# Patient Record
Sex: Female | Born: 1970 | Race: Black or African American | Hispanic: No | Marital: Married | Smoking: Never smoker
Health system: Southern US, Community
[De-identification: ages and names within clinical notes are randomized; demographics above are authoritative.]

## PROBLEM LIST (undated history)

## (undated) ENCOUNTER — Emergency Department (HOSPITAL_BASED_OUTPATIENT_CLINIC_OR_DEPARTMENT_OTHER): Admission: EM | Payer: 59 | Source: Home / Self Care

## (undated) DIAGNOSIS — G35 Multiple sclerosis: Secondary | ICD-10-CM

## (undated) DIAGNOSIS — M2550 Pain in unspecified joint: Secondary | ICD-10-CM

## (undated) DIAGNOSIS — Z91018 Allergy to other foods: Secondary | ICD-10-CM

## (undated) DIAGNOSIS — M1990 Unspecified osteoarthritis, unspecified site: Secondary | ICD-10-CM

## (undated) DIAGNOSIS — G35D Multiple sclerosis, unspecified: Secondary | ICD-10-CM

## (undated) DIAGNOSIS — D571 Sickle-cell disease without crisis: Secondary | ICD-10-CM

## (undated) DIAGNOSIS — I10 Essential (primary) hypertension: Secondary | ICD-10-CM

## (undated) HISTORY — DX: Unspecified osteoarthritis, unspecified site: M19.90

## (undated) HISTORY — DX: Allergy to other foods: Z91.018

## (undated) HISTORY — DX: Sickle-cell disease without crisis: D57.1

## (undated) HISTORY — DX: Multiple sclerosis, unspecified: G35.D

## (undated) HISTORY — PX: BRAIN AVM REPAIR: SHX202

## (undated) HISTORY — DX: Pain in unspecified joint: M25.50

## (undated) HISTORY — DX: Essential (primary) hypertension: I10

## (undated) HISTORY — DX: Multiple sclerosis: G35

---

## 1996-11-07 HISTORY — PX: BRAIN SURGERY: SHX531

## 1998-08-26 ENCOUNTER — Inpatient Hospital Stay (HOSPITAL_COMMUNITY): Admission: AD | Admit: 1998-08-26 | Discharge: 1998-09-02 | Payer: Self-pay | Admitting: Family Medicine

## 1998-08-26 ENCOUNTER — Encounter: Payer: Self-pay | Admitting: Neurosurgery

## 1998-08-31 ENCOUNTER — Encounter: Payer: Self-pay | Admitting: Neurosurgery

## 1998-09-25 ENCOUNTER — Ambulatory Visit (HOSPITAL_COMMUNITY): Admission: RE | Admit: 1998-09-25 | Discharge: 1998-09-25 | Payer: Self-pay | Admitting: Psychiatry

## 1998-10-08 ENCOUNTER — Ambulatory Visit (HOSPITAL_COMMUNITY): Admission: RE | Admit: 1998-10-08 | Discharge: 1998-10-08 | Payer: Self-pay | Admitting: Psychiatry

## 1998-12-22 ENCOUNTER — Ambulatory Visit (HOSPITAL_COMMUNITY): Admission: RE | Admit: 1998-12-22 | Discharge: 1998-12-22 | Payer: Self-pay | Admitting: Psychiatry

## 1998-12-30 ENCOUNTER — Ambulatory Visit (HOSPITAL_COMMUNITY): Admission: RE | Admit: 1998-12-30 | Discharge: 1998-12-30 | Payer: Self-pay | Admitting: Psychiatry

## 1999-05-05 ENCOUNTER — Other Ambulatory Visit: Admission: RE | Admit: 1999-05-05 | Discharge: 1999-05-05 | Payer: Self-pay | Admitting: *Deleted

## 1999-05-21 ENCOUNTER — Ambulatory Visit (HOSPITAL_COMMUNITY): Admission: RE | Admit: 1999-05-21 | Discharge: 1999-05-21 | Payer: Self-pay | Admitting: *Deleted

## 2005-03-24 ENCOUNTER — Other Ambulatory Visit: Admission: RE | Admit: 2005-03-24 | Discharge: 2005-03-24 | Payer: Self-pay | Admitting: Family Medicine

## 2006-02-01 ENCOUNTER — Encounter: Admission: RE | Admit: 2006-02-01 | Discharge: 2006-02-01 | Payer: Self-pay | Admitting: Family Medicine

## 2006-02-23 ENCOUNTER — Encounter: Admission: RE | Admit: 2006-02-23 | Discharge: 2006-02-23 | Payer: Self-pay | Admitting: Family Medicine

## 2006-03-13 ENCOUNTER — Ambulatory Visit (HOSPITAL_COMMUNITY): Admission: RE | Admit: 2006-03-13 | Discharge: 2006-03-13 | Payer: Self-pay | Admitting: Family Medicine

## 2010-01-29 ENCOUNTER — Encounter: Admission: RE | Admit: 2010-01-29 | Discharge: 2010-01-29 | Payer: Self-pay | Admitting: Psychiatry

## 2011-12-17 ENCOUNTER — Emergency Department (HOSPITAL_COMMUNITY)
Admission: EM | Admit: 2011-12-17 | Discharge: 2011-12-17 | Disposition: A | Discharge status: Home / Self Care | Payer: Medicare Other | Attending: Emergency Medicine | Admitting: Emergency Medicine

## 2011-12-17 ENCOUNTER — Encounter (HOSPITAL_COMMUNITY): Payer: Self-pay | Admitting: *Deleted

## 2011-12-17 ENCOUNTER — Emergency Department (HOSPITAL_COMMUNITY): Payer: Medicare Other

## 2011-12-17 DIAGNOSIS — R220 Localized swelling, mass and lump, head: Secondary | ICD-10-CM | POA: Insufficient documentation

## 2011-12-17 DIAGNOSIS — G35 Multiple sclerosis: Secondary | ICD-10-CM | POA: Insufficient documentation

## 2011-12-17 DIAGNOSIS — K1120 Sialoadenitis, unspecified: Secondary | ICD-10-CM | POA: Insufficient documentation

## 2011-12-17 HISTORY — DX: Multiple sclerosis: G35

## 2011-12-17 MED ORDER — IOHEXOL 300 MG/ML  SOLN
100.0000 mL | Freq: Once | INTRAMUSCULAR | Status: AC | PRN
  Administered 2011-12-17: 100 mL via INTRAVENOUS

## 2011-12-17 NOTE — ED Notes (Signed)
Patient transported to CT 

## 2011-12-17 NOTE — ED Notes (Signed)
Pt in c/o sudden onset of swelling to left lower jaw, denies mouth, tongue or throat swelling. Denies pain when moving jaw. Denies pain.

## 2011-12-17 NOTE — ED Provider Notes (Signed)
History     CSN: [id number]  Arrival date & time 12/17/11  1617   First MD Initiated Contact with Patient 12/17/11 1625     4:59 PM HPI Meredith Wright is a 41 y.o. female presenting with left facial swelling. Reports she was eating [employer] and suddenly felt the left side of her face began to swell. Denies difficulty breathing, tongue swelling, pruritus, shortness of breath, cough, wheezing, rash. Denies ear pain. Denies facial pain. Denies any known allergies. Reports she has eaten that sandwich in the past without a reaction. Denies dental pain, sore throat, numbness, tingling or weakness. History of MS. Does not take any medications.  The history is provided by the patient.    Past Medical History  Diagnosis Date  . MS (multiple sclerosis)     History reviewed. No pertinent past surgical history.  History reviewed. No pertinent family history.  History  Substance Use Topics  . Smoking status: Not on file  . Smokeless tobacco: Not on file  . Alcohol Use:     OB History    Grav Para Term Preterm Abortions TAB SAB Ect Mult Living                  Review of Systems  Constitutional: Negative for chills and unexpected weight change.  HENT: Positive for facial swelling. Negative for congestion, sore throat, rhinorrhea, sneezing, mouth sores, neck pain, neck stiffness, dental problem, postnasal drip and sinus pressure.   Respiratory: Negative for shortness of breath and wheezing.   Gastrointestinal: Negative for nausea and vomiting.  Neurological: Negative for dizziness, numbness and headaches.  All other systems reviewed and are negative.    Allergies  Review of patient's allergies indicates no known allergies.  Home Medications  No current outpatient prescriptions on file.  BP 130/72  Pulse 68  Temp(Src) 98.7 F (37.1 C) (Oral)  Resp 16  Ht 5\' 7"  (1.702 m)  Wt 260 lb (117.935 kg)  BMI 40.72 kg/m2  SpO2 100%  Physical Exam  Vitals reviewed. Constitutional:  She is oriented to person, place, and time. Vital signs are normal. She appears well-developed and well-nourished. No distress.  HENT:  Head: Atraumatic. Head is without laceration.    Right Ear: Hearing, tympanic membrane, external ear and ear canal normal.  Left Ear: Hearing, tympanic membrane, external ear and ear canal normal.  Nose: Nose normal.  Mouth/Throat: Uvula is midline, oropharynx is clear and moist and mucous membranes are normal.  Eyes: Pupils are equal, round, and reactive to light.  Neck: Neck supple.  Pulmonary/Chest: Effort normal.  Neurological: She is alert and oriented to person, place, and time.  Skin: Skin is warm and dry. No rash noted. No erythema. No pallor.  Psychiatric: She has a normal mood and affect. Her behavior is normal.    ED Course  Procedures No results found for this or any previous visit. Ct Maxillofacial W/cm  12/17/2011  *RADIOLOGY REPORT*  Clinical Data: Left facial swelling.  Question  parotitis  CT MAXILLOFACIAL WITH CONTRAST  Technique:  Multidetector CT imaging of the maxillofacial structures was performed with intravenous contrast. Multiplanar CT image reconstructions were also generated.  Contrast: OMNIPAQUE IOHEXOL 300 MG/ML IV SOLN  Comparison: None.  Findings: Mild edema is noted in the   fat below the left parotid gland.  This is deep to the platysma muscle and may represent mild parotitis or cellulitis.  The remainder of the parotid gland is fatty infiltrated and appears mildly enlarged.  No parotid stones. No focal fluid collection.  Submandibular gland is normal bilaterally.  Larynx is normal.  Para pharyngeal soft tissues are normal.  Left level II lymph node measures 11 x 13 mm.  Right level II lymph node measures 13 x 7 mm.  These are likely reactive nodes.  Paranasal sinuses are clear.  Orbit is intact.  IMPRESSION: Mild enlargement of left parotid gland with mild edema  inferior to the  left parotid gland.  Findings suggest mild  parotitis.  Original Report Authenticated By: Camelia Phenes, M.D.     MDM  5:04 PM  Suspect a left parotitis. Will order a CT scan for further evaluation. Pt not currently having pain.   6:23 PM Diagnosis: mild parotitis Treatment: warm compresses, lemon drops and ENT referral.  Discussed this with patient and family who voice understanding and agree with plan. Questions have all been answered.     Thomasene Lot, PA-C 12/17/11 1824

## 2011-12-18 NOTE — ED Provider Notes (Signed)
Medical screening examination/treatment/procedure(s) were performed by non-physician practitioner and as supervising physician I was immediately available for consultation/collaboration.   Camella Seim M Duan Scharnhorst, DO 12/18/11 0326 

## 2013-06-03 ENCOUNTER — Other Ambulatory Visit: Payer: Medicare Other

## 2013-06-06 ENCOUNTER — Encounter: Payer: Self-pay | Admitting: Physician Assistant

## 2013-06-06 ENCOUNTER — Ambulatory Visit (INDEPENDENT_AMBULATORY_CARE_PROVIDER_SITE_OTHER): Payer: Medicare Other | Admitting: Physician Assistant

## 2013-06-06 ENCOUNTER — Other Ambulatory Visit: Payer: Self-pay | Admitting: Physician Assistant

## 2013-06-06 VITALS — BP 144/104 | HR 72 | Temp 97.4°F | Resp 18 | Ht 66.0 in | Wt 267.0 lb

## 2013-06-06 DIAGNOSIS — Z23 Encounter for immunization: Secondary | ICD-10-CM

## 2013-06-06 DIAGNOSIS — Z79899 Other long term (current) drug therapy: Secondary | ICD-10-CM

## 2013-06-06 DIAGNOSIS — D649 Anemia, unspecified: Secondary | ICD-10-CM

## 2013-06-06 DIAGNOSIS — G35 Multiple sclerosis: Secondary | ICD-10-CM

## 2013-06-06 DIAGNOSIS — Z0000 Encounter for general adult medical examination without abnormal findings: Secondary | ICD-10-CM

## 2013-06-06 DIAGNOSIS — D571 Sickle-cell disease without crisis: Secondary | ICD-10-CM | POA: Insufficient documentation

## 2013-06-06 DIAGNOSIS — G35D Multiple sclerosis, unspecified: Secondary | ICD-10-CM

## 2013-06-06 DIAGNOSIS — I10 Essential (primary) hypertension: Secondary | ICD-10-CM

## 2013-06-06 LAB — CBC WITH DIFFERENTIAL/PLATELET
Basophils Absolute: 0 10*3/uL (ref 0.0–0.1)
Basophils Relative: 1 % (ref 0–1)
Eosinophils Absolute: 0.1 10*3/uL (ref 0.0–0.7)
Eosinophils Relative: 2 % (ref 0–5)
HCT: 31.9 % — ABNORMAL LOW (ref 36.0–46.0)
Hemoglobin: 10.9 g/dL — ABNORMAL LOW (ref 12.0–15.0)
MCHC: 34.2 g/dL (ref 30.0–36.0)
Monocytes Relative: 8 % (ref 3–12)
Neutro Abs: 4 10*3/uL (ref 1.7–7.7)
Platelets: 320 10*3/uL (ref 150–400)
RBC: 4.25 MIL/uL (ref 3.87–5.11)

## 2013-06-06 LAB — COMPLETE METABOLIC PANEL WITH GFR
ALT: 11 U/L (ref 0–35)
AST: 14 U/L (ref 0–37)
Alkaline Phosphatase: 95 U/L (ref 39–117)
CO2: 24 mEq/L (ref 19–32)
Calcium: 9.1 mg/dL (ref 8.4–10.5)
Chloride: 103 mEq/L (ref 96–112)
Creat: 0.6 mg/dL (ref 0.50–1.10)
GFR, Est African American: >89 mL/min
GFR, Est Non African American: >89 mL/min
Glucose, Bld: 81 mg/dL (ref 70–99)
Total Bilirubin: 0.6 mg/dL (ref 0.3–1.2)

## 2013-06-06 LAB — LIPID PANEL
Total CHOL/HDL Ratio: 3.9 Ratio
VLDL: 24 mg/dL (ref 0–40)

## 2013-06-06 MED ORDER — AMLODIPINE BESYLATE 5 MG PO TABS: 5.0000 mg | ORAL_TABLET | Freq: Every day | ORAL | Status: DC

## 2013-06-06 NOTE — Addendum Note (Signed)
Addended by: WRAY, [place] on: 06/06/2013 11:21 AM   Modules accepted: Orders

## 2013-06-06 NOTE — Progress Notes (Signed)
Patient ID: Meredith Wright MRN: [id number], DOB: 1970/12/05, 42 y.o. Date of Encounter: @DATE @  Chief Complaint:  Chief Complaint  Patient presents with  . routine check up    hasn't been here in awhile, needs labs for neurology    HPI: 42 y.o. year old AA female  presents for CPE. Her LOV here was in 2011. Says she has seen no other PCP since. She sees Neuro for her Multiple Sclerosis. She has lab order sheet to have labs drawn here that were ordered by Neuro. I noted that this Neurologist is located at [facility]. Asked pt why she goes down there for care. She says this Neurologist used to be located here then he moved to [facility]. She has been with him for 13 years so she followed him there for her Neuro care.  She has no complaints or concerns to evaluate today but wanst to have physical.  Is fasting.  Does not want to do plevic exam today b/c on menses. Says she will return for that. Has been > 1 year since last pap/pelvic/Gyn care.    Past Medical History  Diagnosis Date  . MS (multiple sclerosis)   . Multiple sclerosis   . Sickle cell anemia     trait    SHE IS ON 3 MEDS FROM NEURO-THEY WERE ENTERED TODAY Home Meds: See attached medication section for current medication list. Any medications entered into computer today will not appear on this note's list. The medications listed below were entered prior to today. No current outpatient prescriptions on file prior to visit.   No current facility-administered medications on file prior to visit.    Allergies: No Known Allergies  History   Social History  . Marital Status: Married    Spouse Name: N/A    Number of Children: N/A  . Years of Education: N/A   Occupational History  . Not on file.   Social History Main Topics  . Smoking status: Never Smoker   . Smokeless tobacco: Never Used  . Alcohol Use: No  . Drug Use: No  . Sexually Active: Not on file   Other Topics Concern  . Not on file   Social  History Narrative   On Disability Secondary To Multiple Sclerosis   Does not work sec to this      Married. 2 children.   Son: 15 y/o   Daughter: 60 y/o    Family History  Problem Relation Age of Onset  . Heart disease Mother     murmur  . Heart disease Daughter     murmur  . Heart disease Maternal Grandmother   . Diabetes Maternal Grandmother      Review of Systems:  See HPI for pertinent ROS. All other ROS negative.    Physical Exam: Blood pressure 144/104, pulse 72, temperature 97.4 F (36.3 C), temperature source Oral, resp. rate 18, height 5\' 6"  (1.676 m), weight 267 lb (121.11 kg), last menstrual period 05/16/2013., Body mass index is 43.12 kg/(m^2). BP by ME; 142/100 on Right, Loud and Clear. Above was checked on left by nurse per pt. General:Obese AAF.  Appears in no acute distress. Head: Normocephalic, atraumatic, eyes without discharge, sclera non-icteric, nares are without discharge. Bilateral auditory canals clear, TM's are without perforation, pearly grey and translucent with reflective cone of light bilaterally. Oral cavity moist, posterior pharynx without exudate, erythema, peritonsillar abscess, or post nasal drip.  Neck: Supple. No thyromegaly. No lymphadenopathy.No carotid bruits.  Lungs:  Clear bilaterally to auscultation without wheezes, rales, or rhonchi. Breathing is unlabored. Heart: RRR with S1 S2. No murmurs, rubs, or gallops. Abdomen: Soft, non-tender, non-distended with normoactive bowel sounds. No hepatomegaly. No rebound/guarding. No obvious abdominal masses. Musculoskeletal:  Strength and tone normal for age. Extremities/Skin: Warm and dry. No edema. No rashes or suspicious lesions. Neuro: Alert and oriented X 3. Moves all extremities spontaneously. Gait is normal. CNII-XII grossly in tact. Psych:  Responds to questions appropriately with a normal affect.     ASSESSMENT AND PLAN:  42 y.o. year old female with  1. Visit for preventive health  examination - CBC with Differential - COMPLETE METABOLIC PANEL WITH GFR - Lipid panel - Vitamin D 25 hydroxy - TSH  2. Multiple sclerosis Per Neuro.   3. Essential hypertension, benign BP elevated. Start Norvasc. Will recheck BP at f/u OV in 2-3 weeks.take Norvasc that morning prior to OV - amLODipine (NORVASC) 5 MG tablet; Take 1 tablet (5 mg total) by mouth daily.  Dispense: 90 tablet; Refill: 3  4. RTC in 2-3 weeks when off menses to do Breast and Pelvic Exam with Pap smear.  Will discuss/order Mammogram at that OV as well. Will recheck BP on med at that OV also.I told her to take Norvasc that morning prior to OV .   Immunizations: Tetanus is the only immun required at this age. She says has been >10 years. She is agreeable to have this today.Give now.   [address] [place], [place], [facility] 06/06/2013 9:32 AM

## 2013-06-07 LAB — IRON AND TIBC
%SAT: 13 % — ABNORMAL LOW (ref 20–55)
TIBC: 328 ug/dL (ref 250–470)

## 2013-06-07 NOTE — Addendum Note (Signed)
Addended by: Reginia Forts on: 06/07/2013 04:00 PM   Modules accepted: Orders

## 2013-06-10 ENCOUNTER — Other Ambulatory Visit: Payer: Medicare Other

## 2013-06-10 DIAGNOSIS — D649 Anemia, unspecified: Secondary | ICD-10-CM

## 2013-06-11 ENCOUNTER — Telehealth: Payer: Self-pay | Admitting: Family Medicine

## 2013-06-11 DIAGNOSIS — D649 Anemia, unspecified: Secondary | ICD-10-CM

## 2013-06-11 LAB — FECAL OCCULT BLOOD, IMMUNOCHEMICAL: Fecal Occult Blood: NEGATIVE

## 2013-06-11 MED ORDER — POLYSACCHARIDE IRON COMPLEX 150 MG PO CAPS: 150.0000 mg | ORAL_CAPSULE | Freq: Two times a day (BID) | ORAL | Status: DC

## 2013-06-11 NOTE — Telephone Encounter (Signed)
Spoke to patient about all labs.  Hemoccult cards negative.  Provider wants her to start Nu Iron BID.  Rx to pharmacy.  Counseled patient on constipation prevention.

## 2013-06-11 NOTE — Telephone Encounter (Signed)
Message copied by Donne Anon on Tue Jun 11, 2013 10:48 AM ------      Message from: Allayne Butcher      Created: Mon Jun 10, 2013  4:31 PM       Labs show anemia.       Do the Heme occults .      AFTER she does these, start iron RX:             Nu Iron 150mg  one po BID  #60 / 5      Eat increased vegetables, water etc to prevent constipation on iron ------

## 2013-06-27 ENCOUNTER — Ambulatory Visit: Payer: Medicare Other | Admitting: Physician Assistant

## 2014-04-01 DIAGNOSIS — N938 Other specified abnormal uterine and vaginal bleeding: Secondary | ICD-10-CM | POA: Insufficient documentation

## 2014-04-25 ENCOUNTER — Other Ambulatory Visit: Payer: Self-pay | Admitting: Nurse Practitioner

## 2014-04-25 DIAGNOSIS — Z1231 Encounter for screening mammogram for malignant neoplasm of breast: Secondary | ICD-10-CM

## 2014-05-02 ENCOUNTER — Ambulatory Visit
Admission: RE | Admit: 2014-05-02 | Discharge: 2014-05-02 | Disposition: A | Discharge status: Home / Self Care | Payer: Medicare Other | Source: Ambulatory Visit | Attending: Nurse Practitioner | Admitting: Nurse Practitioner

## 2014-05-02 DIAGNOSIS — Z1231 Encounter for screening mammogram for malignant neoplasm of breast: Secondary | ICD-10-CM

## 2014-05-06 ENCOUNTER — Other Ambulatory Visit: Payer: Self-pay | Admitting: Nurse Practitioner

## 2014-05-06 DIAGNOSIS — R928 Other abnormal and inconclusive findings on diagnostic imaging of breast: Secondary | ICD-10-CM

## 2014-05-15 ENCOUNTER — Ambulatory Visit
Admission: RE | Admit: 2014-05-15 | Discharge: 2014-05-15 | Disposition: A | Discharge status: Home / Self Care | Payer: Medicare Other | Source: Ambulatory Visit | Attending: Nurse Practitioner | Admitting: Nurse Practitioner

## 2014-05-15 DIAGNOSIS — R928 Other abnormal and inconclusive findings on diagnostic imaging of breast: Secondary | ICD-10-CM

## 2015-10-16 ENCOUNTER — Other Ambulatory Visit: Payer: Self-pay | Admitting: Nurse Practitioner

## 2015-10-16 DIAGNOSIS — Z1231 Encounter for screening mammogram for malignant neoplasm of breast: Secondary | ICD-10-CM

## 2015-11-12 ENCOUNTER — Ambulatory Visit: Payer: Medicare Other

## 2015-12-20 ENCOUNTER — Emergency Department (HOSPITAL_BASED_OUTPATIENT_CLINIC_OR_DEPARTMENT_OTHER)
Admission: EM | Admit: 2015-12-20 | Discharge: 2015-12-20 | Disposition: A | Discharge status: Home / Self Care | Payer: Medicare Other | Attending: Emergency Medicine | Admitting: Emergency Medicine

## 2015-12-20 ENCOUNTER — Encounter (HOSPITAL_BASED_OUTPATIENT_CLINIC_OR_DEPARTMENT_OTHER): Payer: Self-pay | Admitting: Emergency Medicine

## 2015-12-20 DIAGNOSIS — R5383 Other fatigue: Secondary | ICD-10-CM | POA: Diagnosis not present

## 2015-12-20 DIAGNOSIS — R319 Hematuria, unspecified: Secondary | ICD-10-CM

## 2015-12-20 DIAGNOSIS — R079 Chest pain, unspecified: Secondary | ICD-10-CM | POA: Insufficient documentation

## 2015-12-20 DIAGNOSIS — Z79899 Other long term (current) drug therapy: Secondary | ICD-10-CM | POA: Diagnosis not present

## 2015-12-20 DIAGNOSIS — Z8669 Personal history of other diseases of the nervous system and sense organs: Secondary | ICD-10-CM | POA: Diagnosis not present

## 2015-12-20 DIAGNOSIS — N390 Urinary tract infection, site not specified: Secondary | ICD-10-CM | POA: Diagnosis not present

## 2015-12-20 DIAGNOSIS — D571 Sickle-cell disease without crisis: Secondary | ICD-10-CM | POA: Insufficient documentation

## 2015-12-20 DIAGNOSIS — R42 Dizziness and giddiness: Secondary | ICD-10-CM | POA: Diagnosis present

## 2015-12-20 LAB — BASIC METABOLIC PANEL
Anion gap: 9 (ref 5–15)
BUN: 11 mg/dL (ref 6–20)
CHLORIDE: 104 mmol/L (ref 101–111)
CO2: 27 mmol/L (ref 22–32)
CREATININE: 0.86 mg/dL (ref 0.44–1.00)
Calcium: 8.4 mg/dL — ABNORMAL LOW (ref 8.9–10.3)
Glucose, Bld: 120 mg/dL — ABNORMAL HIGH (ref 65–99)
POTASSIUM: 3.5 mmol/L (ref 3.5–5.1)
SODIUM: 140 mmol/L (ref 135–145)

## 2015-12-20 LAB — URINALYSIS, ROUTINE W REFLEX MICROSCOPIC
Bilirubin Urine: NEGATIVE
Glucose, UA: NEGATIVE mg/dL
KETONES UR: NEGATIVE mg/dL
NITRITE: NEGATIVE
PROTEIN: NEGATIVE mg/dL
Specific Gravity, Urine: 1.012 (ref 1.005–1.030)
pH: 7 (ref 5.0–8.0)

## 2015-12-20 LAB — URINE MICROSCOPIC-ADD ON

## 2015-12-20 LAB — CBC
HEMATOCRIT: 33.4 % — AB (ref 36.0–46.0)
Hemoglobin: 11.4 g/dL — ABNORMAL LOW (ref 12.0–15.0)
MCH: 25.9 pg — ABNORMAL LOW (ref 26.0–34.0)
MCHC: 34.1 g/dL (ref 30.0–36.0)
MCV: 75.9 fL — ABNORMAL LOW (ref 78.0–100.0)
PLATELETS: 296 10*3/uL (ref 150–400)
RBC: 4.4 MIL/uL (ref 3.87–5.11)
RDW: 16.1 % — AB (ref 11.5–15.5)
WBC: 7.5 10*3/uL (ref 4.0–10.5)

## 2015-12-20 LAB — TROPONIN I

## 2015-12-20 MED ORDER — CEPHALEXIN 500 MG PO CAPS: 500.0000 mg | ORAL_CAPSULE | Freq: Three times a day (TID) | ORAL | Status: DC

## 2015-12-20 NOTE — ED Notes (Signed)
Patient is very hard to assess, family who is with the patient at all times reports "she always answers questions weird" ... Unable to assess last known normal - only person who seems to think the patient is answering questions with some confusion is a friend who saw her 1 week ago and she admits that the patient seems confused, with altered mental status and evasive with questions.

## 2015-12-20 NOTE — ED Notes (Signed)
Patient states that she has had Chest pain off and on for an unknown amount of time. Reports that she was dizzy last week and still having some intermittent dizziness. Family members states that " she has complained of chest pain for the last 3 days and dizziness, I took her BP at home and her Bp was 220/106" The patient seems very confused about what is taking place with her. Reports that she would not have come if it had not been for the families concern

## 2015-12-20 NOTE — ED Provider Notes (Signed)
CSN: [id number]     Arrival date & time 12/20/15  1734 History  By signing my name below, I, Meredith Wright, attest that this documentation has been prepared under the direction and in the presence of [name], MD. Electronically Signed: Soijett Wright, ED Scribe. 12/20/2015. 6:34 PM.   Chief Complaint  Patient presents with  . Chest Pain  . Dizziness      The history is provided by the patient. No language interpreter was used.    HPI Comments: Meredith Wright is a 45 y.o. female with a medical hx of MS and sickle cell anemia trait, who presents to the Emergency Department complaining of intermittent sternal CP onset 1 week. Pt denies having CP at this time and the last episode was last week. She describes the CP as a pressure sensation. Pt friend took her blood pressure was 220/106 at home prior to arrival to the ED today. She states that she is having associated symptoms of intermittent dizziness and fatigue. She notes that she began to have dizziness while working out, and she felt off balance with a room spinning sensation. Pt states that that incidence occurred once and hasn't returned since. She states that she has not tried any medications for the relief for her symptoms. She denies vomiting, vision change, and any other symptoms. Denies being diabetic. Pt was on norvasc in 2014 for HTN that she only took for 1 month and didn't have any other HTN medications since. Pt does have a PCP at this time at [facility].   Past Medical History  Diagnosis Date  . MS (multiple sclerosis) (HCC)   . Multiple sclerosis (HCC)   . Sickle cell anemia (HCC)     trait   Past Surgical History  Procedure Laterality Date  . Cesarean section    . Brain surgery  11/07/1996    Biopsy -Dx MS  . Brain avm repair     Family History  Problem Relation Age of Onset  . Heart disease Mother     murmur  . Heart disease Daughter     murmur  . Heart disease Maternal Grandmother   . Diabetes  Maternal Grandmother    Social History  Substance Use Topics  . Smoking status: Never Smoker   . Smokeless tobacco: Never Used  . Alcohol Use: No   OB History    No data available     Review of Systems  Constitutional: Positive for fatigue.  Cardiovascular: Positive for chest pain.  Skin: Negative for color change, rash and wound.  Neurological: Positive for dizziness.  All other systems reviewed and are negative.     Allergies  Review of patient's allergies indicates no known allergies.  Home Medications   Prior to Admission medications   Medication Sig Start Date End Date Taking? Authorizing Provider  amLODipine (NORVASC) 5 MG tablet Take 1 tablet (5 mg total) by mouth daily. 06/06/13   Mary B Dixon, PA-C  AUBAGIO 14 MG TABS Take 1 tablet by mouth daily. 05/28/13   Historical Provider, MD  gabapentin (NEURONTIN) 300 MG capsule Take 1 capsule by mouth 4 (four) times daily. 06/01/13   Historical Provider, MD  iron polysaccharides (NIFEREX) 150 MG capsule Take 1 capsule (150 mg total) by mouth 2 (two) times daily. 06/11/13   [name], PA-C  levETIRAcetam (KEPPRA) 500 MG tablet Take 1 tablet by mouth 2 (two) times daily. 06/01/13   Historical Provider, MD   BP 150/118 mmHg  Pulse  73  Temp(Src) 98 F (36.7 C) (Oral)  Resp 18  Ht  (1.702 m)  Wt 286 lb (129.729 kg)  BMI 44.78 kg/m2  SpO2 96%  LMP 12/20/2015 Physical Exam  Constitutional: She is oriented to person, place, and time. She appears well-developed and well-nourished. No distress.  HENT:  Head: Normocephalic and atraumatic.  Eyes: EOM are normal. Pupils are equal, round, and reactive to light.  Neck: Neck supple.  Cardiovascular: Normal rate, regular rhythm and normal heart sounds.  Exam reveals no gallop and no friction rub.   No murmur heard. Pulmonary/Chest: Effort normal and breath sounds normal. No respiratory distress. She has no wheezes. She has no rales.  Abdominal: Soft. There is no tenderness.   Musculoskeletal: Normal range of motion.  Neurological: She is alert and oriented to person, place, and time. No cranial nerve deficit. Coordination normal.  Nl finger to nose testing. Nl rapid alternating movement. Nl heel to shin.   Skin: Skin is warm and dry.  Psychiatric: She has a normal mood and affect. Her behavior is normal.  Nursing note and vitals reviewed.   ED Course  Procedures (including critical care time) DIAGNOSTIC STUDIES: Oxygen Saturation is 96% on RA, nl by my interpretation.    COORDINATION OF CARE: 6:34 PM Discussed treatment plan with pt at bedside which includes EKG, labs, UA, and pt agreed to plan.    Labs Review Labs Reviewed  BASIC METABOLIC PANEL - Abnormal; Notable for the following:    Glucose, Bld 120 (*)    Calcium 8.4 (*)    All other components within normal limits  CBC - Abnormal; Notable for the following:    Hemoglobin 11.4 (*)    HCT 33.4 (*)    MCV 75.9 (*)    MCH 25.9 (*)    RDW 16.1 (*)    All other components within normal limits  URINALYSIS, ROUTINE W REFLEX MICROSCOPIC (NOT AT [facility]) - Abnormal; Notable for the following:    APPearance CLOUDY (*)    Hgb urine dipstick LARGE (*)    Leukocytes, UA MODERATE (*)    All other components within normal limits  URINE MICROSCOPIC-ADD ON - Abnormal; Notable for the following:    Squamous Epithelial / LPF 0-5 (*)    Bacteria, UA MANY (*)    All other components within normal limits  URINE CULTURE  TROPONIN I    Imaging Review No results found. I have personally reviewed and evaluated these images and lab results as part of my medical decision-making.   EKG Interpretation   Date/Time:  Sunday December 20 2015 17:40:21 EST Ventricular Rate:  82 PR Interval:  160 QRS Duration: 78 QT Interval:  378 QTC Calculation: 441 R Axis:   69 Text Interpretation:  Normal sinus rhythm Normal ECG No previous tracing  Confirmed by [name] MD, [name] ([id number]) on 12/20/2015 5:57:01 PM      MDM    Final diagnoses:  Urinary tract infection with hematuria, site unspecified    45 y.o. female presents with intermittent dizziness over the last week and feeling unwell. Member took her blood pressure at home and found it to be elevated. It is elevated on a single reading here today but recurrent correct to normal level without intervention. I do not feel the patient needs antihypertensive therapy currently and I recommended follow-up with primary care for that problem. A dispute patient has developed a urinary tract infection which is the likely etiology of her ongoing dizziness symptoms of  feeling unwell. She had a brief episode of chest pain that occurred 1 week ago that resolved spontaneously, no EKG abnormalities, negative troponin from triage, we'll treat empirically with Keflex for UTI pending culture. Plan to follow up with PCP as needed and return precautions discussed for worsening or new concerning symptoms.   I personally performed the services described in this documentation, which was scribed in my presence. The recorded information has been reviewed and is accurate.      [name], MD 12/20/15 2322

## 2015-12-20 NOTE — Discharge Instructions (Signed)

## 2015-12-22 LAB — URINE CULTURE

## 2016-04-08 DIAGNOSIS — D573 Sickle-cell trait: Secondary | ICD-10-CM | POA: Insufficient documentation

## 2018-02-23 ENCOUNTER — Encounter (HOSPITAL_COMMUNITY): Payer: Self-pay

## 2018-02-23 ENCOUNTER — Encounter (HOSPITAL_COMMUNITY)
Admission: RE | Admit: 2018-02-23 | Discharge: 2018-02-23 | Disposition: A | Discharge status: Home / Self Care | Payer: Medicare Other | Source: Ambulatory Visit | Attending: Psychiatry | Admitting: Psychiatry

## 2018-02-23 DIAGNOSIS — G35 Multiple sclerosis: Secondary | ICD-10-CM | POA: Diagnosis not present

## 2018-02-23 MED ORDER — SODIUM CHLORIDE 0.9 % IV SOLN
INTRAVENOUS | Status: DC
  Administered 2018-02-23: 08:00:00 via INTRAVENOUS

## 2018-02-23 MED ORDER — DIPHENHYDRAMINE HCL 50 MG/ML IJ SOLN
25.0000 mg | INTRAMUSCULAR | Status: DC
Start: 2018-02-23 — End: 2018-02-24
  Administered 2018-02-23: 25 mg via INTRAVENOUS
  Filled 2018-02-23: qty 1

## 2018-02-23 MED ORDER — SODIUM CHLORIDE 0.9 % IV SOLN
300.0000 mg | INTRAVENOUS | Status: DC
  Administered 2018-02-23: 300 mg via INTRAVENOUS
  Filled 2018-02-23: qty 10

## 2018-02-23 MED ORDER — ACETAMINOPHEN 500 MG PO TABS
1000.0000 mg | ORAL_TABLET | ORAL | Status: DC
  Administered 2018-02-23: 1000 mg via ORAL
  Filled 2018-02-23: qty 2

## 2018-02-23 MED ORDER — METHYLPREDNISOLONE SODIUM SUCC 125 MG IJ SOLR
125.0000 mg | INTRAMUSCULAR | Status: DC
  Administered 2018-02-23: 125 mg via INTRAVENOUS
  Filled 2018-02-23: qty 2

## 2018-02-23 NOTE — Discharge Instructions (Signed)
Ocrelizumab injection What is this medicine? OCRELIZUMAB (ok re LIZ ue mab) treats multiple sclerosis. It helps to decrease the number of multiple sclerosis relapses. It is not a cure. This medicine may be used for other purposes; ask your health care provider or pharmacist if you have questions. COMMON BRAND NAME(S): OCREVUS What should I tell my health care provider before I take this medicine? They need to know if you have any of these conditions: -cancer -hepatitis B infection -other infection (especially a virus infection such as chickenpox, cold sores, or herpes) -an unusual or allergic reaction to ocrelizumab, other medicines, foods, dyes or preservatives -pregnant or trying to get pregnant -breast-feeding How should I use this medicine? This medicine is for infusion into a vein. It is given by a health care professional in a hospital or clinic setting. Talk to your pediatrician regarding the use of this medicine in children. Special care may be needed. Overdosage: If you think you have taken too much of this medicine contact a poison control center or emergency room at once. NOTE: This medicine is only for you. Do not share this medicine with others. What if I miss a dose? Keep appointments for follow-up doses as directed. It is important not to miss your dose. Call your doctor or health care professional if you are unable to keep an appointment. What may interact with this medicine? -alemtuzumab -daclizumab -dimethyl fumarate -fingolimod -glatiramer -interferon beta -live virus vaccines -mitoxantrone -natalizumab -peginterferon beta -rituximab -steroid medicines like prednisone or cortisone -teriflunomide This list may not describe all possible interactions. Give your health care provider a list of all the medicines, herbs, non-prescription drugs, or dietary supplements you use. Also tell them if you smoke, drink alcohol, or use illegal drugs. Some items may interact with  your medicine. What should I watch for while using this medicine? Tell your doctor or healthcare professional if your symptoms do not start to get better or if they get worse. This medicine can cause serious allergic reactions. To reduce your risk you may need to take medicine before treatment with this medicine. Take your medicine as directed. Women should inform their doctor if they wish to become pregnant or think they might be pregnant. There is a potential for serious side effects to an unborn child. Talk to your health care professional or pharmacist for more information. Female patients should use effective birth control methods while receiving this medicine and for 6 months after the last dose. Call your doctor or health care professional for advice if you get a fever, chills or sore throat, or other symptoms of a cold or flu. Do not treat yourself. This drug decreases your body's ability to fight infections. Try to avoid being around people who are sick. If you have a hepatitis B infection or a history of a hepatitis B infection, talk to your doctor. The symptoms of hepatitis B may get worse if you take this medicine. In some patients, this medicine may cause a serious brain infection that may cause death. If you have any problems seeing, thinking, speaking, walking, or standing, tell your doctor right away. If you cannot reach your doctor, urgently seek other source of medical care. This medicine can decrease the response to a vaccine. If you need to get vaccinated, tell your healthcare professional if you have received this medicine. Extra booster doses may be needed. Talk to your doctor to see if a different vaccination schedule is needed. Talk to your doctor about your risk of cancer.   You may be more at risk for certain types of cancers if you take this medicine. What side effects may I notice from receiving this medicine? Side effects that you should report to your doctor or health care  professional as soon as possible: -allergic reactions like skin rash, itching or hives, swelling of the face, lips, or tongue -breathing problems -facial flushing -fast, irregular heartbeat -lump or soreness in the breast -signs and symptoms of herpes such as cold sore, shingles, or genital sores -signs and symptoms of infection like fever or chills, cough, sore throat, pain or trouble passing urine -signs and symptoms of low blood pressure like dizziness; feeling faint or lightheaded, falls; unusually weak or tired -signs and symptoms of progressive multifocal leukoencephalopathy (PML) like changes in vision; clumsiness; confusion; personality changes; weakness on one side of the body -swelling of the ankles, feet, hands Side effects that usually do not require medical attention (report these to your doctor or health care professional if they continue or are bothersome): -back pain -depressed mood -diarrhea -pain, redness, or irritation at site where injected This list may not describe all possible side effects. Call your doctor for medical advice about side effects. You may report side effects to FDA at 1-800-FDA-1088. Where should I keep my medicine? This drug is given in a hospital or clinic and will not be stored at home. NOTE: This sheet is a summary. It may not cover all possible information. If you have questions about this medicine, talk to your doctor, pharmacist, or health care provider.  2018 Elsevier/Gold Standard (2016-02-09 09:40:25)  

## 2018-02-23 NOTE — Progress Notes (Signed)
Pt. Tolerated 1st infusion of Ocrevus without any problems, pt. To follow-up with doctor with any problems. Patient to return in 2 weeks.

## 2018-03-09 ENCOUNTER — Encounter (HOSPITAL_COMMUNITY)
Admission: RE | Admit: 2018-03-09 | Discharge: 2018-03-09 | Disposition: A | Discharge status: Home / Self Care | Payer: Medicare Other | Source: Ambulatory Visit | Attending: Psychiatry | Admitting: Psychiatry

## 2018-03-09 ENCOUNTER — Encounter (HOSPITAL_COMMUNITY): Payer: Self-pay

## 2018-03-09 DIAGNOSIS — G35 Multiple sclerosis: Secondary | ICD-10-CM | POA: Diagnosis not present

## 2018-03-09 MED ORDER — ACETAMINOPHEN 500 MG PO TABS
1000.0000 mg | ORAL_TABLET | ORAL | Status: AC
  Administered 2018-03-09: 1000 mg via ORAL
  Filled 2018-03-09: qty 2

## 2018-03-09 MED ORDER — DIPHENHYDRAMINE HCL 50 MG/ML IJ SOLN
25.0000 mg | INTRAMUSCULAR | Status: AC
  Administered 2018-03-09: 25 mg via INTRAVENOUS
  Filled 2018-03-09: qty 1

## 2018-03-09 MED ORDER — SODIUM CHLORIDE 0.9 % IV SOLN
INTRAVENOUS | Status: AC
  Administered 2018-03-09: 07:00:00 via INTRAVENOUS

## 2018-03-09 MED ORDER — SODIUM CHLORIDE 0.9 % IV SOLN
300.0000 mg | INTRAVENOUS | Status: AC
  Administered 2018-03-09: 300 mg via INTRAVENOUS
  Filled 2018-03-09: qty 10

## 2018-03-09 MED ORDER — METHYLPREDNISOLONE SODIUM SUCC 125 MG IJ SOLR
125.0000 mg | INTRAMUSCULAR | Status: AC
  Administered 2018-03-09: 125 mg via INTRAVENOUS
  Filled 2018-03-09: qty 2

## 2018-03-09 NOTE — Discharge Instructions (Signed)
°Ocrevus °Ocrelizumab injection °What is this medicine? °OCRELIZUMAB (ok re LIZ ue mab) treats multiple sclerosis. It helps to decrease the number of multiple sclerosis relapses. It is not a cure. °This medicine may be used for other purposes; ask your health care provider or pharmacist if you have questions. °COMMON BRAND NAME(S): OCREVUS °What should I tell my health care provider before I take this medicine? °They need to know if you have any of these conditions: °-cancer °-hepatitis B infection °-other infection (especially a virus infection such as chickenpox, cold sores, or herpes) °-an unusual or allergic reaction to ocrelizumab, other medicines, foods, dyes or preservatives °-pregnant or trying to get pregnant °-breast-feeding °How should I use this medicine? °This medicine is for infusion into a vein. It is given by a health care professional in a hospital or clinic setting. °Talk to your pediatrician regarding the use of this medicine in children. Special care may be needed. °Overdosage: If you think you have taken too much of this medicine contact a poison control center or emergency room at once. °NOTE: This medicine is only for you. Do not share this medicine with others. °What if I miss a dose? °Keep appointments for follow-up doses as directed. It is important not to miss your dose. Call your doctor or health care professional if you are unable to keep an appointment. °What may interact with this medicine? °-alemtuzumab °-daclizumab °-dimethyl fumarate °-fingolimod °-glatiramer °-interferon beta °-live virus vaccines °-mitoxantrone °-natalizumab °-peginterferon beta °-rituximab °-steroid medicines like prednisone or cortisone °-teriflunomide °This list may not describe all possible interactions. Give your health care provider a list of all the medicines, herbs, non-prescription drugs, or dietary supplements you use. Also tell them if you smoke, drink alcohol, or use illegal drugs. Some items may  interact with your medicine. °What should I watch for while using this medicine? °Tell your doctor or healthcare professional if your symptoms do not start to get better or if they get worse. °This medicine can cause serious allergic reactions. To reduce your risk you may need to take medicine before treatment with this medicine. Take your medicine as directed. °Women should inform their doctor if they wish to become pregnant or think they might be pregnant. There is a potential for serious side effects to an unborn child. Talk to your health care professional or pharmacist for more information. Female patients should use effective birth control methods while receiving this medicine and for 6 months after the last dose. °Call your doctor or health care professional for advice if you get a fever, chills or sore throat, or other symptoms of a cold or flu. Do not treat yourself. This drug decreases your body's ability to fight infections. Try to avoid being around people who are sick. °If you have a hepatitis B infection or a history of a hepatitis B infection, talk to your doctor. The symptoms of hepatitis B may get worse if you take this medicine. °In some patients, this medicine may cause a serious brain infection that may cause death. If you have any problems seeing, thinking, speaking, walking, or standing, tell your doctor right away. If you cannot reach your doctor, urgently seek other source of medical care. °This medicine can decrease the response to a vaccine. If you need to get vaccinated, tell your healthcare professional if you have received this medicine. Extra booster doses may be needed. Talk to your doctor to see if a different vaccination schedule is needed. °Talk to your doctor about your risk of   cancer. You may be more at risk for certain types of cancers if you take this medicine. °What side effects may I notice from receiving this medicine? °Side effects that you should report to your doctor or  health care professional as soon as possible: °-allergic reactions like skin rash, itching or hives, swelling of the face, lips, or tongue °-breathing problems °-facial flushing °-fast, irregular heartbeat °-lump or soreness in the breast °-signs and symptoms of herpes such as cold sore, shingles, or genital sores °-signs and symptoms of infection like fever or chills, cough, sore throat, pain or trouble passing urine °-signs and symptoms of low blood pressure like dizziness; feeling faint or lightheaded, falls; unusually weak or tired °-signs and symptoms of progressive multifocal leukoencephalopathy (PML) like changes in vision; clumsiness; confusion; personality changes; weakness on one side of the body °-swelling of the ankles, feet, hands °Side effects that usually do not require medical attention (report these to your doctor or health care professional if they continue or are bothersome): °-back pain °-depressed mood °-diarrhea °-pain, redness, or irritation at site where injected °This list may not describe all possible side effects. Call your doctor for medical advice about side effects. You may report side effects to FDA at 1-800-FDA-1088. °Where should I keep my medicine? °This drug is given in a hospital or clinic and will not be stored at home. °NOTE: This sheet is a summary. It may not cover all possible information. If you have questions about this medicine, talk to your doctor, pharmacist, or health care provider. °© 2018 [employer] (2016-02-09 09:40:25) ° °

## 2018-03-09 NOTE — Progress Notes (Addendum)
Patient had second Ocrevus infusion (two weeks after first). Tolerated well. BP elevated upon arrival to short stay as well as when leaving. States she checks it at home twice a day and states it usually starts out high and decreases to normal value later in the day. Encouraged patient to follow up with medical MD if bp continues to be elevated and stays high. States she feels good and is ready to go Patient ambulated to lobby with daughter and Charity fundraiser. Aware of when to call MD. Has next 6 mos appointment made.

## 2018-08-10 ENCOUNTER — Encounter (HOSPITAL_COMMUNITY)
Admission: RE | Admit: 2018-08-10 | Discharge: 2018-08-10 | Disposition: A | Discharge status: Home / Self Care | Payer: Medicare Other | Source: Ambulatory Visit | Attending: Psychiatry | Admitting: Psychiatry

## 2018-08-10 ENCOUNTER — Encounter (HOSPITAL_COMMUNITY): Payer: Self-pay

## 2018-08-10 DIAGNOSIS — G35 Multiple sclerosis: Secondary | ICD-10-CM | POA: Insufficient documentation

## 2018-08-10 MED ORDER — SODIUM CHLORIDE 0.9 % IV SOLN
600.0000 mg | Freq: Once | INTRAVENOUS | Status: AC
  Administered 2018-08-10: 600 mg via INTRAVENOUS
  Filled 2018-08-10: qty 20

## 2018-08-10 MED ORDER — ACETAMINOPHEN 500 MG PO TABS
1000.0000 mg | ORAL_TABLET | Freq: Once | ORAL | Status: AC
  Administered 2018-08-10: 1000 mg via ORAL
  Filled 2018-08-10: qty 2

## 2018-08-10 MED ORDER — SODIUM CHLORIDE 0.9 % IV SOLN
INTRAVENOUS | Status: AC
  Administered 2018-08-10: 07:00:00 via INTRAVENOUS

## 2018-08-10 MED ORDER — DIPHENHYDRAMINE HCL 50 MG/ML IJ SOLN
25.0000 mg | Freq: Once | INTRAMUSCULAR | Status: AC
  Administered 2018-08-10: 25 mg via INTRAVENOUS
  Filled 2018-08-10: qty 1

## 2018-08-10 MED ORDER — METHYLPREDNISOLONE SODIUM SUCC 125 MG IJ SOLR
125.0000 mg | Freq: Once | INTRAMUSCULAR | Status: AC
  Administered 2018-08-10: 125 mg via INTRAVENOUS
  Filled 2018-08-10: qty 2

## 2018-09-21 ENCOUNTER — Encounter (HOSPITAL_COMMUNITY): Payer: Medicare HMO

## 2019-02-22 ENCOUNTER — Encounter (HOSPITAL_COMMUNITY): Payer: Medicare HMO

## 2019-03-04 ENCOUNTER — Ambulatory Visit (HOSPITAL_COMMUNITY): Payer: Medicare HMO

## 2019-09-19 ENCOUNTER — Other Ambulatory Visit: Payer: Self-pay | Admitting: Psychiatry

## 2019-09-19 DIAGNOSIS — G35 Multiple sclerosis: Secondary | ICD-10-CM

## 2019-10-06 ENCOUNTER — Other Ambulatory Visit: Payer: Self-pay

## 2019-10-06 ENCOUNTER — Ambulatory Visit
Admission: RE | Admit: 2019-10-06 | Discharge: 2019-10-06 | Disposition: A | Discharge status: Home / Self Care | Payer: Medicare HMO | Source: Ambulatory Visit | Attending: Psychiatry | Admitting: Psychiatry

## 2019-10-06 DIAGNOSIS — G35 Multiple sclerosis: Secondary | ICD-10-CM

## 2019-10-06 MED ORDER — GADOBENATE DIMEGLUMINE 529 MG/ML IV SOLN
20.0000 mL | Freq: Once | INTRAVENOUS | Status: AC | PRN
  Administered 2019-10-06: 20 mL via INTRAVENOUS

## 2021-01-15 ENCOUNTER — Encounter (HOSPITAL_BASED_OUTPATIENT_CLINIC_OR_DEPARTMENT_OTHER): Payer: Self-pay | Admitting: Family Medicine

## 2021-01-15 ENCOUNTER — Ambulatory Visit (INDEPENDENT_AMBULATORY_CARE_PROVIDER_SITE_OTHER): Payer: 59 | Admitting: Family Medicine

## 2021-01-15 ENCOUNTER — Other Ambulatory Visit (HOSPITAL_BASED_OUTPATIENT_CLINIC_OR_DEPARTMENT_OTHER): Payer: Self-pay

## 2021-01-15 ENCOUNTER — Other Ambulatory Visit: Payer: Self-pay

## 2021-01-15 ENCOUNTER — Ambulatory Visit (HOSPITAL_BASED_OUTPATIENT_CLINIC_OR_DEPARTMENT_OTHER)
Admission: RE | Admit: 2021-01-15 | Discharge: 2021-01-15 | Disposition: A | Discharge status: Home / Self Care | Payer: 59 | Source: Ambulatory Visit | Attending: Family Medicine | Admitting: Family Medicine

## 2021-01-15 VITALS — BP 150/90 | HR 71 | Ht 68.0 in | Wt 292.2 lb

## 2021-01-15 DIAGNOSIS — M25561 Pain in right knee: Secondary | ICD-10-CM | POA: Diagnosis present

## 2021-01-15 DIAGNOSIS — G35 Multiple sclerosis: Secondary | ICD-10-CM | POA: Insufficient documentation

## 2021-01-15 DIAGNOSIS — R030 Elevated blood-pressure reading, without diagnosis of hypertension: Secondary | ICD-10-CM

## 2021-01-15 DIAGNOSIS — Z6841 Body Mass Index (BMI) 40.0 and over, adult: Secondary | ICD-10-CM | POA: Diagnosis not present

## 2021-01-15 DIAGNOSIS — M25562 Pain in left knee: Secondary | ICD-10-CM | POA: Diagnosis present

## 2021-01-15 DIAGNOSIS — Z7689 Persons encountering health services in other specified circumstances: Secondary | ICD-10-CM

## 2021-01-15 DIAGNOSIS — M170 Bilateral primary osteoarthritis of knee: Secondary | ICD-10-CM | POA: Insufficient documentation

## 2021-01-15 LAB — CBC WITH DIFFERENTIAL/PLATELET
Abs Immature Granulocytes: 0.02 10*3/uL (ref 0.00–0.07)
Basophils Absolute: 0.1 10*3/uL (ref 0.0–0.1)
Basophils Relative: 1 %
Eosinophils Absolute: 0.2 10*3/uL (ref 0.0–0.5)
Eosinophils Relative: 2 %
HCT: 38.2 % (ref 36.0–46.0)
Hemoglobin: 13.1 g/dL (ref 12.0–15.0)
Immature Granulocytes: 0 %
Lymphocytes Relative: 20 %
Lymphs Abs: 1.8 10*3/uL (ref 0.7–4.0)
MCH: 27.5 pg (ref 26.0–34.0)
MCHC: 34.3 g/dL (ref 30.0–36.0)
MCV: 80.3 fL (ref 80.0–100.0)
Monocytes Absolute: 0.8 10*3/uL (ref 0.1–1.0)
Monocytes Relative: 9 %
Neutro Abs: 6.1 10*3/uL (ref 1.7–7.7)
Neutrophils Relative %: 68 %
Platelets: 327 10*3/uL (ref 150–400)
RBC: 4.76 MIL/uL (ref 3.87–5.11)
RDW: 13.7 % (ref 11.5–15.5)
WBC: 9 10*3/uL (ref 4.0–10.5)
nRBC: 0 % (ref 0.0–0.2)

## 2021-01-15 LAB — LIPID PANEL
Cholesterol: 210 mg/dL — ABNORMAL HIGH (ref 0–200)
HDL: 50 mg/dL (ref >40)
LDL Cholesterol: 128 mg/dL — ABNORMAL HIGH (ref 0–99)
Total CHOL/HDL Ratio: 4.2 RATIO
Triglycerides: 159 mg/dL — ABNORMAL HIGH (ref <150)
VLDL: 32 mg/dL (ref 0–40)

## 2021-01-15 LAB — COMPREHENSIVE METABOLIC PANEL
ALT: 12 U/L (ref 0–44)
AST: 14 U/L — ABNORMAL LOW (ref 15–41)
Albumin: 4.1 g/dL (ref 3.5–5.0)
Alkaline Phosphatase: 75 U/L (ref 38–126)
Anion gap: 8 (ref 5–15)
BUN: 14 mg/dL (ref 6–20)
CO2: 28 mmol/L (ref 22–32)
Calcium: 9.2 mg/dL (ref 8.9–10.3)
Chloride: 102 mmol/L (ref 98–111)
Creatinine, Ser: 0.82 mg/dL (ref 0.44–1.00)
GFR, Estimated: >60 mL/min (ref >60)
Glucose, Bld: 92 mg/dL (ref 70–99)
Potassium: 4.1 mmol/L (ref 3.5–5.1)
Sodium: 138 mmol/L (ref 135–145)
Total Bilirubin: 0.6 mg/dL (ref 0.3–1.2)
Total Protein: 7.4 g/dL (ref 6.5–8.1)

## 2021-01-15 NOTE — Patient Instructions (Addendum)
Medication Instructions:  Your physician recommends that you continue on your current medications as directed. Please refer to the Current Medication list given to you today. *If you need a refill on any your medications before your next appointment, please call your pharmacy first. If no refills are authorized on file call the office.*  Lab Work: Your physician has recommended that you have lab work today: CBC, Lipid Panel, And Comprehensive Metabolic Panel  If you have labs (blood work) drawn today and your tests are completely normal, you will receive your results only by: Marland Kitchen MyChart Message (if you have MyChart) OR . A phone call from our staff. Please ensure you check your voicemail in the event that you authorized detailed messages to be left on a delegated number. If you have any lab test that is abnormal or we need to change your treatment, we will call you to review the results.  Procedures: Your physician recommends that you have an X-ray of your Left and Right Knee.  X-rays use invisible electromagnetic energy beams to produce images of internal tissues, bones, and organs on film or digital media. Standard X-rays are performed for many reasons, including diagnosing tumors or bone injuries.   You may have these images done at the Imaging Center located at [facility] at [facility] on the ground floor, [phone]. They are a walk-in imaging facility. The hours of operation are:   Monday:7:30 AM - 5:00 PM Tuesday:7:30 AM - 5:00 PM Wednesday:7:30 AM - 5:00 PM Thursday:7:30 AM - 5:00 PM Friday:7:30 AM - 5:00 PM Saturday:Closed Sunday: Closed   Follow-Up: Your next appointment:   Your physician recommends that you schedule a follow-up appointment in: 6-8 WEEKS with Dr Tommi Rumps [place].  We recommend signing up for the patient portal called "MyChart".  Sign up information is provided on this After Visit Summary.  MyChart is used to connect with patients for Virtual Visits  (Telemedicine).  Patients are able to view lab/test results, encounter notes, upcoming appointments, etc.  Non-urgent messages can be sent to your provider as well.   To learn more about what you can do with MyChart, go to [email or web address].

## 2021-01-15 NOTE — Assessment & Plan Note (Signed)
Blood pressure slightly elevated in the office today She has had some prior elevated readings during other encounters per chart review Reports that she does check her blood pressure at home and that readings are typically normal Discussed lifestyle modifications to aid in lowering her blood pressure as well as healthy weight loss Advised that she bring her home blood pressure cuff to her next appointment so that we can compare readings to our results here

## 2021-01-15 NOTE — Assessment & Plan Note (Signed)
Discussed lifestyle modifications with patient including focusing on diet that incorporates fresh fruits and vegetables, lean protein, avoidance of processed foods, high glycemic index foods, saturated fats Encouraged to achieve about 150 minutes of aerobic exercise per week, discussed these parameters We will monitor weight at next appointment in about 6 to 8 weeks

## 2021-01-15 NOTE — Assessment & Plan Note (Signed)
New problem for patient Suspect contusion to left knee in setting of recent fall, possible underlying osteoarthritis Will check bilateral knee x-rays Discussed some possible treatment options if underlying OA is shown on x-rays For now, can continue with topical treatments and conservative measures.

## 2021-01-15 NOTE — Progress Notes (Signed)
New Patient Office Visit  Subjective:  Patient ID: Meredith Wright, female    DOB: April 21, 1971  Age: 50 y.o. MRN: [id number]  CC:  Chief Complaint  Patient presents with  . Knee Pain    HPI Carmelina Balducci Blau is a 50 year old female presenting to establish in clinic.  She also has concerns regarding bilateral knee pain.  Past medical history is significant for multiple sclerosis.  Knee pain: Current symptoms are going on for about 2 weeks.  Pain is worse in the left knee and right.  Did have a fall this morning onto her left knee with some increased soreness now.  Has been using topical ointment and heat to help with symptoms.  Pain worse with activity.  Primary location is over anterior joint line bilaterally.  Denies any prior injuries or surgeries to the knees.  Some feelings of instability, no true locking, no significant swelling.  Elevated blood pressure: Blood pressure slightly elevated today, has had some elevated blood pressure during other encounters on reviewing the chart.  Reports that she does check her blood pressure at home with automated blood pressure cuff and diabetes readings are typically normal.  Denies any issues with chest pain, lightheadedness, dizziness, vision changes.  Multiple sclerosis: Follows with Dr. Harriette Bouillon at [facility] neuro.  She sees Dr. Tinnie Gens about once every 6 months.  Currently managed with Ocrevus IV infusion every 6 months.  In reviewing our records, she most recently had an MRI brain about 2 years ago which showed extensive chronic multiple sclerosis without evidence of interval progression or active demyelination compared to prior study in 2018.  Past Medical History:  Diagnosis Date  . MS (multiple sclerosis) (HCC)   . Multiple sclerosis (HCC)   . Sickle cell anemia (HCC)    trait    Past Surgical History:  Procedure Laterality Date  . BRAIN AVM REPAIR    . BRAIN SURGERY  11/07/1996   Biopsy -Dx MS  . CESAREAN SECTION      Family  History  Problem Relation Age of Onset  . Heart disease Mother        murmur  . Heart disease Daughter        murmur  . Heart disease Maternal Grandmother   . Diabetes Maternal Grandmother     Social History   Socioeconomic History  . Marital status: Married    Spouse name: Not on file  . Number of children: Not on file  . Years of education: Not on file  . Highest education level: Not on file  Occupational History  . Not on file  Tobacco Use  . Smoking status: Never Smoker  . Smokeless tobacco: Never Used  Substance and Sexual Activity  . Alcohol use: No  . Drug use: No  . Sexual activity: Not on file  Other Topics Concern  . Not on file  Social History Narrative   On Disability Secondary To Multiple Sclerosis   Does not work sec to this      Married. 2 children.   Son: 31 y/o   Daughter: 36 y/o   Social Determinants of Community education officer: Not on file  Food Insecurity: Not on file  Transportation Needs: Not on file  Physical Activity: Not on file  Stress: Not on file  Social Connections: Not on file  Intimate Partner Violence: Not on file    Objective:   Today's Vitals: BP (!) 150/90   Pulse 71  Ht 5\' 8"  (1.727 m)   Wt 292 lb 3.2 oz (132.5 kg)   SpO2 99%   BMI 44.43 kg/m   Physical Exam  Pleasant 50 year old female in no acute distress Cardiovascular exam with regular rate and rhythm, no murmurs appreciated Lungs clear to auscultation bilaterally Knee exam:  Right knee with mild joint line tenderness, more so medially than laterally.  No significant swelling appreciated, normal range of motion with mild crepitus appreciated. Left knee with moderate joint line tenderness, medial worse than lateral.  No significant effusion appreciated, normal range of motion with mild crepitus appreciated.  Mild pain with passive knee flexion.  Assessment & Plan:   Problem List Items Addressed This Visit      Other   Knee pain, bilateral    New  problem for patient Suspect contusion to left knee in setting of recent fall, possible underlying osteoarthritis Will check bilateral knee x-rays Discussed some possible treatment options if underlying OA is shown on x-rays For now, can continue with topical treatments and conservative measures.      Relevant Orders   Comprehensive metabolic panel   CBC with Differential/Platelet   Lipid panel   DG Knee Complete 4 Views Right   DG Knee Complete 4 Views Left   BMI 40.0-44.9, adult (HCC)    Discussed lifestyle modifications with patient including focusing on diet that incorporates fresh fruits and vegetables, lean protein, avoidance of processed foods, high glycemic index foods, saturated fats Encouraged to achieve about 150 minutes of aerobic exercise per week, discussed these parameters We will monitor weight at next appointment in about 6 to 8 weeks      Relevant Orders   Comprehensive metabolic panel   CBC with Differential/Platelet   Lipid panel   Elevated BP without diagnosis of hypertension    Blood pressure slightly elevated in the office today She has had some prior elevated readings during other encounters per chart review Reports that she does check her blood pressure at home and that readings are typically normal Discussed lifestyle modifications to aid in lowering her blood pressure as well as healthy weight loss Advised that she bring her home blood pressure cuff to her next appointment so that we can compare readings to our results here      Relevant Orders   Comprehensive metabolic panel   CBC with Differential/Platelet   Lipid panel    Other Visit Diagnoses    Encounter to establish care with new doctor    -  Primary   Relevant Orders   Comprehensive metabolic panel   CBC with Differential/Platelet   Lipid panel    Will request records from neurologist office  Outpatient Encounter Medications as of 01/15/2021  Medication Sig  . gabapentin (NEURONTIN) 300  MG capsule Take 1 capsule by mouth 4 (four) times daily.  03/17/2021 levETIRAcetam (KEPPRA) 500 MG tablet Take 1 tablet by mouth 2 (two) times daily.   Marland Kitchen ocrelizumab (OCREVUS) 300 MG/10ML injection Inject 1 Dose into the vein every 6 (six) months.  Marland Kitchen omeprazole (PRILOSEC) 20 MG capsule Take 20 mg by mouth daily.  Marland Kitchen oxybutynin (DITROPAN) 5 MG tablet Take 5 mg by mouth daily.  . [DISCONTINUED] amLODipine (NORVASC) 5 MG tablet Take 1 tablet (5 mg total) by mouth daily. (Patient not taking: Reported on 01/15/2021)  . [DISCONTINUED] AUBAGIO 14 MG TABS Take 1 tablet by mouth daily. (Patient not taking: Reported on 01/15/2021)  . [DISCONTINUED] cephALEXin (KEFLEX) 500 MG capsule Take 1 capsule (500 mg  total) by mouth 3 (three) times daily. (Patient not taking: Reported on 01/15/2021)  . [DISCONTINUED] iron polysaccharides (NIFEREX) 150 MG capsule Take 1 capsule (150 mg total) by mouth 2 (two) times daily. (Patient not taking: Reported on 01/15/2021)   No facility-administered encounter medications on file as of 01/15/2021.   Spent 45 minutes on this patient encounter, including preparation, chart review, face-to-face counseling with patient and coordination of care, and documentation of encounter  Follow-up: Return in about 6 weeks (around 02/26/2021), or 6-8 weeks for HTN and Knee pain follow up, for follow up - in office.   Madgie Dhaliwal J De [place], MD

## 2021-01-18 ENCOUNTER — Telehealth (HOSPITAL_BASED_OUTPATIENT_CLINIC_OR_DEPARTMENT_OTHER): Payer: Self-pay

## 2021-01-18 NOTE — Telephone Encounter (Signed)
Pt is aware and agreeable to imaging results and recommendations Pt also received xray results.  She is aware and agreeable to results

## 2021-01-18 NOTE — Telephone Encounter (Signed)
-----   Message from Hosie Poisson [place], MD sent at 01/18/2021 10:26 AM EDT ----- Red blood cell and white blood cell counts are normal, no evidence of anemia. Electrolytes, liver and kidney function are normal. There is a slight elevation in the "bad" cholesterol - important to focus on lifestyle modifications including diet incorporating fresh fruits and vegetables, lean protein and avoiding processed foods, saturated fats, red meat and working to engage in regularly weekly exercise.

## 2021-02-25 ENCOUNTER — Other Ambulatory Visit: Payer: Self-pay

## 2021-02-25 ENCOUNTER — Encounter (HOSPITAL_BASED_OUTPATIENT_CLINIC_OR_DEPARTMENT_OTHER): Payer: Self-pay | Admitting: Family Medicine

## 2021-02-25 ENCOUNTER — Ambulatory Visit (INDEPENDENT_AMBULATORY_CARE_PROVIDER_SITE_OTHER): Payer: Medicare Other | Admitting: Family Medicine

## 2021-02-25 DIAGNOSIS — R030 Elevated blood-pressure reading, without diagnosis of hypertension: Secondary | ICD-10-CM

## 2021-02-25 DIAGNOSIS — M1712 Unilateral primary osteoarthritis, left knee: Secondary | ICD-10-CM

## 2021-02-25 NOTE — Assessment & Plan Note (Addendum)
Discussed diagnosis and treatment options with patient She would like to continue with NSAIDs, OTC measures to help control pain -can continue with diclofenac Interested in working with physical therapy, referral placed, advised on home exercise program as per PT Discussed option of intra-articular corticosteroid injection, patient wishes to hold off on this for now which is reasonable Plan for follow-up in a couple months to monitor progress

## 2021-02-25 NOTE — Progress Notes (Signed)
    Procedures performed today:    None.  Independent interpretation of notes and tests performed by another provider:   None.  Brief History, Exam, Impression, and Recommendations:    Meredith Wright is a 50 year old female presenting for follow-up of left knee pain and elevated blood pressure.  Left knee pain: Continues to have some pain along the anterior joint line, medial worse than lateral.  She did see another provider who had prescribed diclofenac that she has been taking to try and help with the knee pain.  Feels the knee is bothersome more so at night than during the day.  X-rays completed since last visit showed mild to moderate osteoarthritis with joint space narrowing most prominent medial compartment with osteophytes.  High blood pressure: Has been checking blood pressure at home with wrist cuff.  Reports that home readings are "not high".  She does not have her blood pressure log with her today however.  Denies any issues with chest pain, headaches, shortness of breath, lightheadedness or dizziness.  BP (!) 142/80   Pulse 65   Ht 5\' 8"  (1.727 m)   Wt 297 lb 3.2 oz (134.8 kg)   SpO2 98%   BMI 45.19 kg/m   Pleasant 50 year old female in no acute distress Cardiovascular exam with regular rate and rhythm, no murmurs appreciated Lungs clear to auscultation bilaterally Left knee exam with mild tenderness palpation along anterior joint line, medial worse than lateral.  Slight restriction in range of motion for flexion and extension.  Osteoarthritis of left knee Discussed diagnosis and treatment options with patient She would like to continue with NSAIDs, OTC measures to help control pain -can continue with diclofenac Interested in working with physical therapy, referral placed, advised on home exercise program as per PT Discussed option of intra-articular corticosteroid injection, patient wishes to hold off on this for now which is reasonable Plan for follow-up in a couple months to  monitor progress  Elevated BP without diagnosis of hypertension Blood pressure improved today compared to last visit Patient indicates that blood pressure has "not been high" at home although she did not bring log with her today Advised that she monitor for any change in symptoms Instructed to bring blood pressure log to next appointment for review Continue with lifestyle modifications for now including low-salt diet, gradual increase in physical activity Monitor blood pressure at follow-up visit    ___________________________________________ Aldrick Derrig de 55, MD, ABFM, CAQSM Primary Care and Sports Medicine [facility]

## 2021-02-25 NOTE — Patient Instructions (Signed)
  Medication Instructions:  Your physician recommends that you continue on your current medications as directed. Please refer to the Current Medication list given to you today. --If you need a refill on any your medications before your next appointment, please call your pharmacy first. If no refills are authorized on file call the office.--  Follow-Up: Your next appointment:   Your physician recommends that you schedule a follow-up appointment in: 2 MONTHS with Dr. de [place]  Thanks for letting us be apart of your health journey!!  Primary Care and Sports Medicine   Dr. de [place] and Sarabeth Early, DNP, AGNP  We recommend signing up for the patient portal called "MyChart".  Sign up information is provided on this After Visit Summary.  MyChart is used to connect with patients for Virtual Visits (Telemedicine).  Patients are able to view lab/test results, encounter notes, upcoming appointments, etc.  Non-urgent messages can be sent to your provider as well.   To learn more about what you can do with MyChart, please visit --  [email or web address].    

## 2021-02-25 NOTE — Assessment & Plan Note (Signed)
Blood pressure improved today compared to last visit Patient indicates that blood pressure has "not been high" at home although she did not bring log with her today Advised that she monitor for any change in symptoms Instructed to bring blood pressure log to next appointment for review Continue with lifestyle modifications for now including low-salt diet, gradual increase in physical activity Monitor blood pressure at follow-up visit

## 2021-03-19 ENCOUNTER — Telehealth (HOSPITAL_BASED_OUTPATIENT_CLINIC_OR_DEPARTMENT_OTHER): Payer: Self-pay

## 2021-03-19 MED ORDER — DICLOFENAC SODIUM 75 MG PO TBEC: 75.0000 mg | DELAYED_RELEASE_TABLET | Freq: Two times a day (BID) | ORAL | 1 refills | Status: DC

## 2021-03-19 NOTE — Telephone Encounter (Signed)
Patient left a voicemail requesting a medication refill on her arthiritis medication -- diclofenac Patient is scheduled to follow up with Dr. de [place] in June Will authorize medication until next follow up appointment to pharmacy on file

## 2021-04-23 ENCOUNTER — Encounter (HOSPITAL_BASED_OUTPATIENT_CLINIC_OR_DEPARTMENT_OTHER): Payer: Self-pay | Admitting: Obstetrics & Gynecology

## 2021-04-23 ENCOUNTER — Other Ambulatory Visit (HOSPITAL_COMMUNITY)
Admission: RE | Admit: 2021-04-23 | Discharge: 2021-04-23 | Disposition: A | Discharge status: Home / Self Care | Payer: Medicare Other | Source: Ambulatory Visit | Attending: Obstetrics & Gynecology | Admitting: Obstetrics & Gynecology

## 2021-04-23 ENCOUNTER — Ambulatory Visit (INDEPENDENT_AMBULATORY_CARE_PROVIDER_SITE_OTHER): Payer: Medicare Other | Admitting: Obstetrics & Gynecology

## 2021-04-23 ENCOUNTER — Other Ambulatory Visit: Payer: Self-pay

## 2021-04-23 VITALS — BP 174/96 | HR 64 | Ht 67.0 in | Wt 293.2 lb

## 2021-04-23 DIAGNOSIS — Z1211 Encounter for screening for malignant neoplasm of colon: Secondary | ICD-10-CM

## 2021-04-23 DIAGNOSIS — Z01419 Encounter for gynecological examination (general) (routine) without abnormal findings: Secondary | ICD-10-CM | POA: Diagnosis present

## 2021-04-23 DIAGNOSIS — Z1231 Encounter for screening mammogram for malignant neoplasm of breast: Secondary | ICD-10-CM | POA: Diagnosis not present

## 2021-04-23 DIAGNOSIS — N938 Other specified abnormal uterine and vaginal bleeding: Secondary | ICD-10-CM

## 2021-04-23 DIAGNOSIS — Z124 Encounter for screening for malignant neoplasm of cervix: Secondary | ICD-10-CM

## 2021-04-23 DIAGNOSIS — Z1151 Encounter for screening for human papillomavirus (HPV): Secondary | ICD-10-CM | POA: Diagnosis not present

## 2021-04-23 NOTE — Progress Notes (Signed)
50 y.o. Married [place] or [place] American female here for new patient exam.  Referred from Dr. Tommi Rumps [place].  Pt reports she does have bleeding that lasts about two weeks.  Reports had hysterectomy (?) after birth of second child.  Went to [facility].  Can't remember providers name.   Last pap was 2015.    Has MS and still followed by Dr. Tinnie Gens who used to be at [facility] but now is in [place].    Patient's last menstrual period was 03/28/2021 (approximate).          Sexually active: Yes.    The current method of family planning is none.    Smoker:  no  Health Maintenance: Pap:  overdue History of abnormal Pap:  no MMG:  2015 Colonoscopy:  referral will be placed TDaP:  2015 Hep C testing: not done, should plan with follow up blood work Screening Labs: reviewed blood work from 01/2021   reports that she has never smoked. She has never used smokeless tobacco. She reports that she does not drink alcohol and does not use drugs.  Past Medical History:  Diagnosis Date   Multiple sclerosis (HCC)    Sickle cell anemia (HCC)    trait    Past Surgical History:  Procedure Laterality Date   BRAIN SURGERY  11/07/1996   Biopsy -Dx MS   CESAREAN SECTION      Current Outpatient Medications  Medication Sig Dispense Refill   diclofenac (VOLTAREN) 75 MG EC tablet Take 1 tablet (75 mg total) by mouth 2 (two) times daily. 60 tablet 1   gabapentin (NEURONTIN) 300 MG capsule Take 1 capsule by mouth 4 (four) times daily.     levETIRAcetam (KEPPRA) 500 MG tablet Take 1 tablet by mouth 2 (two) times daily.      Multiple Vitamin (MULTIVITAMIN) tablet Take 1 tablet by mouth daily.     ocrelizumab (OCREVUS) 300 MG/10ML injection Inject 1 Dose into the vein every 6 (six) months.     omeprazole (PRILOSEC) 20 MG capsule Take 20 mg by mouth daily.     oxybutynin (DITROPAN) 5 MG tablet Take 5 mg by mouth daily.     solifenacin (VESICARE) 5 MG tablet Take 1 tablet by mouth in the morning and at bedtime.      No current facility-administered medications for this visit.    Family History  Problem Relation Age of Onset   Heart disease Mother        murmur   Heart disease Daughter        murmur   Heart disease Maternal Grandmother    Diabetes Maternal Grandmother     Review of Systems  Gastrointestinal: Negative.   Genitourinary:  Positive for urgency and vaginal bleeding.  All other systems reviewed and are negative.  Exam:   BP (!) 174/96 (BP Location: Right Arm, Patient Position: Sitting, Cuff Size: Large)   Pulse 64   Ht 5\' 7"  (1.702 m)   Wt 293 lb 3.2 oz (133 kg)   LMP 03/28/2021 (Approximate)   BMI 45.92 kg/m   Height: 5\' 7"  (170.2 cm)  General appearance: alert, cooperative and appears stated age Head: Normocephalic, without obvious abnormality, atraumatic Neck: no adenopathy, supple, symmetrical, trachea midline and thyroid normal to inspection and palpation Breasts: normal appearance, no masses or tenderness Abdomen: soft, non-tender; bowel sounds normal; no masses,  no organomegaly Extremities: extremities normal, atraumatic, no cyanosis or edema Skin: Skin color, texture, turgor normal. No rashes or lesions Lymph nodes:  Cervical, supraclavicular, and axillary nodes normal. No abnormal inguinal nodes palpated Neurologic: Grossly normal  Pelvic: External genitalia:  no lesions              Urethra:  normal appearing urethra with no masses, tenderness or lesions              Bartholins and Skenes: normal                 Vagina: normal appearing vagina with normal color and no discharge, no lesions              Cervix: no lesions              Pap taken: Yes.   Bimanual Exam:  Uterus:  normal size, contour, position, consistency, mobility, non-tender (I feel almost certainly she still has her uterus)              Adnexa: normal adnexa               Rectovaginal: Confirms               Anus:  normal sphincter tone, no lesions  Chaperone, Margret Chance, CMA, was  present for exam.  Assessment/Plan: 1. DUB (dysfunctional uterine bleeding) - pt states she had a hysterectomy but I am almost sure she had a uterus.  Pt is going to try and get her old records - US PELVIC COMPLETE WITH TRANSVAGINAL; Future - If uterus is present (which I think it is on exam), will need to proceed with treatment options for irregular bleeding  2. Cervical cancer screening - Cytology - PAP( [place])  3. Colon cancer screening - Ambulatory referral to Gastroenterology  4. Encounter for screening mammogram for malignant neoplasm of breast - MM 3D SCREEN BREAST BILATERAL; Future

## 2021-04-26 ENCOUNTER — Encounter (HOSPITAL_BASED_OUTPATIENT_CLINIC_OR_DEPARTMENT_OTHER): Payer: Self-pay

## 2021-04-26 NOTE — Progress Notes (Signed)
COVID Immunizations updated

## 2021-04-27 ENCOUNTER — Encounter (HOSPITAL_BASED_OUTPATIENT_CLINIC_OR_DEPARTMENT_OTHER): Payer: Self-pay | Admitting: Family Medicine

## 2021-04-27 ENCOUNTER — Ambulatory Visit (INDEPENDENT_AMBULATORY_CARE_PROVIDER_SITE_OTHER): Payer: Medicare Other | Admitting: Family Medicine

## 2021-04-27 ENCOUNTER — Other Ambulatory Visit: Payer: Self-pay

## 2021-04-27 VITALS — BP 150/90 | HR 96 | Ht 67.0 in | Wt 295.0 lb

## 2021-04-27 DIAGNOSIS — M1712 Unilateral primary osteoarthritis, left knee: Secondary | ICD-10-CM | POA: Diagnosis not present

## 2021-04-27 DIAGNOSIS — I10 Essential (primary) hypertension: Secondary | ICD-10-CM | POA: Diagnosis not present

## 2021-04-27 LAB — CYTOLOGY - PAP
Comment: NEGATIVE
Diagnosis: NEGATIVE
High risk HPV: NEGATIVE

## 2021-04-27 MED ORDER — AMLODIPINE BESYLATE 5 MG PO TABS: 5.0000 mg | ORAL_TABLET | Freq: Every day | ORAL | 1 refills | Status: DC

## 2021-04-27 NOTE — Assessment & Plan Note (Signed)
Feels that pain is slightly worse with recent falls, some increased soreness Prior to falls, she was doing well overall Continues to use oral NSAID as needed to help with pain Has topical medication as well Discussed treatment options - continue with NSAID, topical treatments, will refer to PT, HEP as per PT

## 2021-04-27 NOTE — Assessment & Plan Note (Signed)
Has been keeping BP log at home, values range from 134-152 systolic, 85-93 diastolic Denies any issues with CP, HA, SOB, lightheadedness or dizziness Does recall she had been placed on a BP medication in the past, but thinks her neurologist advised her against taking it Given continued elevation in the office and at home, will proceed with pharmacologic treatment - amlodipine 5mg  daily Continue to monitor BP at home intermittently Adhere to DASH diet, work towards gradual weight loss Reassess in 3 weeks in the office

## 2021-04-27 NOTE — Progress Notes (Signed)
    Procedures performed today:    None.  Independent interpretation of notes and tests performed by another provider:   None.  Brief History, Exam, Impression, and Recommendations:     BP (!) 150/90   Pulse 96   Ht 5\' 7"  (1.702 m)   Wt 295 lb (133.8 kg)   LMP 03/28/2021 (Approximate)   SpO2 100%   BMI 46.20 kg/m   Osteoarthritis of left knee Feels that pain is slightly worse with recent falls, some increased soreness Prior to falls, she was doing well overall Continues to use oral NSAID as needed to help with pain Has topical medication as well Discussed treatment options - continue with NSAID, topical treatments, will refer to PT, HEP as per PT  Hypertension Has been keeping BP log at home, values range from 134-152 systolic, 85-93 diastolic Denies any issues with CP, HA, SOB, lightheadedness or dizziness Does recall she had been placed on a BP medication in the past, but thinks her neurologist advised her against taking it Given continued elevation in the office and at home, will proceed with pharmacologic treatment - amlodipine 5mg  daily Continue to monitor BP at home intermittently Adhere to DASH diet, work towards gradual weight loss Reassess in 3 weeks in the office   ___________________________________________ Burtis Imhoff de 03/30/2021, MD, ABFM, CAQSM Primary Care and Sports Medicine [facility]

## 2021-04-27 NOTE — Patient Instructions (Signed)
  Medication Instructions:  Your physician has recommended you make the following change in your medication:  -- START Amlodipine 5 mg - Take 1 tablet by mouth daily -- RX SENT --If you need a refill on any your medications before your next appointment, please call your pharmacy first. If no refills are authorized on file call the office.--  Referrals/Procedures/Imaging: A referral has been placed for you to go to Outpatient Physical Therapy at [facility]. Someone from the scheduling department will be in contact with you in regards to coordinating your consultation. If you do not hear from any of the schedulers within 7-10 business days please give our office a call.  [facility] at [facility] on the 1st Floor Hours of Operation: Monday - Friday 8:00 am - 5:00 pm Closed on weekends and all major holidays (P) [phone]  Follow-Up: Your next appointment:   Your physician recommends that you schedule a follow-up appointment in: 3 WEEKS with Dr. de [place]  Thanks for letting us be apart of your health journey!!  Primary Care and Sports Medicine   Dr. Ceasar Mons [place]   We encourage you to activate your patient portal called "MyChart".  Sign up information is provided on this After Visit Summary.  MyChart is used to connect with patients for Virtual Visits (Telemedicine).  Patients are able to view lab/test results, encounter notes, upcoming appointments, etc.  Non-urgent messages can be sent to your provider as well. To learn more about what you can do with MyChart, please visit --  [email or web address].

## 2021-05-01 ENCOUNTER — Other Ambulatory Visit (HOSPITAL_BASED_OUTPATIENT_CLINIC_OR_DEPARTMENT_OTHER): Payer: Self-pay | Admitting: Family Medicine

## 2021-05-11 ENCOUNTER — Ambulatory Visit (HOSPITAL_BASED_OUTPATIENT_CLINIC_OR_DEPARTMENT_OTHER): Payer: Medicare Other | Attending: Family Medicine | Admitting: Physical Therapy

## 2021-05-11 ENCOUNTER — Other Ambulatory Visit: Payer: Self-pay

## 2021-05-11 ENCOUNTER — Encounter (HOSPITAL_BASED_OUTPATIENT_CLINIC_OR_DEPARTMENT_OTHER): Payer: Self-pay | Admitting: Physical Therapy

## 2021-05-11 DIAGNOSIS — G8929 Other chronic pain: Secondary | ICD-10-CM | POA: Diagnosis present

## 2021-05-11 DIAGNOSIS — M25561 Pain in right knee: Secondary | ICD-10-CM | POA: Insufficient documentation

## 2021-05-11 DIAGNOSIS — M25562 Pain in left knee: Secondary | ICD-10-CM | POA: Diagnosis not present

## 2021-05-11 DIAGNOSIS — R2689 Other abnormalities of gait and mobility: Secondary | ICD-10-CM | POA: Insufficient documentation

## 2021-05-11 DIAGNOSIS — R296 Repeated falls: Secondary | ICD-10-CM | POA: Insufficient documentation

## 2021-05-12 ENCOUNTER — Encounter (HOSPITAL_BASED_OUTPATIENT_CLINIC_OR_DEPARTMENT_OTHER): Payer: Self-pay | Admitting: Physical Therapy

## 2021-05-12 NOTE — Therapy (Addendum)
[facility] [address] [place], [place], [address] Phone: [phone]   Fax:  [phone]  Physical Therapy Evaluation  Patient Details  Name: Meredith Wright MRN: [id number] Date of Birth: 10-30-1971 Referring Provider (PT): [place] for knee pain, Juleen China MS   Encounter Date: 05/11/2021   PT End of Session - 05/11/21 1241     Visit Number 1    Number of Visits 6    Date for PT Re-Evaluation 06/22/21    Authorization Type [employer] Medicare 2022 [place] Medicaid    PT Start Time 0930    PT Stop Time 1015    PT Time Calculation (min) 45 min    Activity Tolerance Patient tolerated treatment well    Behavior During Therapy [facility] for tasks assessed/performed             Past Medical History:  Diagnosis Date   Multiple sclerosis (HCC)    Sickle cell anemia (HCC)    trait    Past Surgical History:  Procedure Laterality Date   BRAIN SURGERY  11/07/1996   Biopsy -Dx MS   CESAREAN SECTION      There were no vitals filed for this visit.    Subjective Assessment - 05/11/21 0943     Subjective Patients presents with bilateral knee pain. She reports that her knee flares up with her MS espeically when she is in a hot enviorment such as when she is out in the heat. Patient states she has decreased pain in cool enviorment. She states that she had a fall and hit her knees on ground around 2-3 weeks ago after spending time outside on a hot day and having her legs go numb (from MS). She has follow up with doctor for knee OA in 1 week and MS follow-up this week.    Pertinent History MS (rapid),    Limitations Walking;Lifting;Standing    How long can you stand comfortably? limited    How long can you walk comfortably? limited    Diagnostic tests X ray: mild-moderate OA    Patient Stated Goals decrease pain, walk dog    Currently in Pain? Yes    Pain Score 4    face pain scale   Pain Location Knee    Pain Orientation Right;Left     Pain Descriptors / Indicators Aching    Pain Type Chronic pain    Pain Onset More than a month ago    Pain Frequency Intermittent    Aggravating Factors  walking, standing    Pain Relieving Factors rest, cool enviorment    Effect of Pain on Daily Activities difficult to walk    Multiple Pain Sites No                            Objective measurements completed on examination: See above findings.               PT Education - 05/11/21 1048     Education Details HEP review and patient education on what to feel during thera-ex. Instructions for exercises.    Person(s) Educated Patient    Methods Explanation;Demonstration;Tactile cues;Verbal cues    Comprehension Verbalized understanding;Returned demonstration;Verbal cues required;Tactile cues required              PT Short Term Goals - 05/11/21 1523       PT SHORT TERM GOAL #1   Title Patient will report decreased pain 2/10.  Baseline 4    Time 3    Period Weeks    Status New    Target Date 06/01/21      PT SHORT TERM GOAL #2   Title Patient will show increased bilateral hip flexion 4+/5.    Baseline 3+/5    Time 3    Period Weeks    Status New    Target Date 06/01/21               PT Long Term Goals - 05/11/21 1527       PT LONG TERM GOAL #1   Title Patient will show increased strength of bilateral hip throughout 5/5 to improve ambulation for ADLs.    Baseline 4/5    Time 6    Period Weeks    Status New    Target Date 06/22/21      PT LONG TERM GOAL #2   Title Pt will improve gait mechanics to be able to ambulate 1000 feet around community.    Time 6    Period Weeks    Status New    Target Date 06/22/21      PT LONG TERM GOAL #3   Title Pt will demostrate thorough understanding of final HEP.    Time 6    Period Weeks    Status New    Target Date 06/22/21      PT LONG TERM GOAL #4   Title Patient will improve strength and balance in order to reduce falls risk  during ambulation in community.    Baseline fall 2 weeks ago    Time 6    Period Weeks    Status New    Target Date 06/22/21               [facility] PT Assessment - 05/18/21 0001       Assessment   Medical Diagnosis Knee OA    Referring Provider (PT) [place] for knee pain, Douglous Tinnie Gens MS    Hand Dominance Right    Next MD Visit Next week for knee, next visit for multiple sclerosis is tomorrow      Precautions   Precautions None      Restrictions   Weight Bearing Restrictions No      Home Environment   Living Environment Private residence    Living Arrangements Spouse/significant other    Type of Home House    Home Access Stairs to enter    Entrance Stairs-Number of Steps 4    Home Layout One level      Prior Function   Level of Independence Independent    Vocation On disability    Leisure spend time with family, walk dog.      Cognition   Overall Cognitive Status Within Functional Limits for tasks assessed    Memory Appears intact    Awareness Appears intact    Problem Solving Appears intact      Observation/Other Assessments   Observations forward head posture      Observation/Other Assessments-Edema    Edema --   swelling and light bruising on left knee where she fell     Sensation   Light Touch Appears Intact    Proprioception Appears Intact      Coordination   Gross Motor Movements are Fluid and Coordinated Yes    Fine Motor Movements are Fluid and Coordinated Yes      AROM   Overall AROM  Within functional limits for tasks performed  PROM   Overall PROM  Within functional limits for tasks performed    Overall PROM Comments hyperextension of bilteral knees      Strength   Right Hip Flexion 3+/5    Right Hip ABduction 4/5    Right Hip ADduction 4/5    Left Hip Flexion 3+/5    Left Hip ABduction 4/5    Left Hip ADduction 4/5    Right Knee Flexion 4+/5    Right Knee Extension 4+/5    Left Knee Flexion 4+/5    Left Knee Extension 4+/5       Palpation   Palpation comment tender to palpation bilateral medial joint midline, and medial knee (more pain on left leg)      Ambulation/Gait   Gait Comments antalgic gait with decrease hip flexion with swing phase, swaying to side event from lack of hip flexion and weakness of hip muscles                  Plan - 05/11/21 1243     Clinical Impression Statement Patient presents with bilateral knee pain with mild-to-moderate osteoathritis of knee that has become more evident after recent fall (2-3 weeks ago). Patient reports that due to her Multiple sclerosis (MS) she gets numbness/weakness of legs happens when she gets overheated and that this caused her to fall. Patient presents will decreased bilateal hip strength (4/5 throughout), antalgic gait with lateral shift along with decreased hip flexion during swing phase of walking, pain upon palpation on midline of knee and medial knee where she fell on it. Patient was educated on increasing strength of hip and improving gait mechanics. Patient completed thera-ex: 2x10 for quad sets, seated clam shells, and seated marches. Patient should continue to progress HEP on next visit, single leg raise, glute bridges etc.    Personal Factors and Comorbidities Comorbidity 2    Comorbidities Multiple sclerosisrapid, hypertension, osteoathritis    Examination-Activity Limitations Squat;Stairs;Stand;Locomotion Level    Examination-Participation Research scientist (physical sciences);Yard Work    Conservation officer, historic buildings Evolving/Moderate complexity    Clinical Decision Making Moderate    Rehab Potential Good    PT Frequency 1x / week    PT Duration 6 weeks    PT Treatment/Interventions Gait training;Stair training;Cryotherapy;Therapeutic activities;Functional mobility training;Therapeutic exercise;Balance training;Manual techniques;Patient/family education;Ultrasound;Contrast Bath;ADLs/Self Care Home Management;Taping;Energy  conservation;Passive range of motion;Joint Manipulations    PT Next Visit Plan Progress HEP plan with single leg raises, glute bridge.    PT Home Exercise Plan Quad sets 2x10, seat banded march 2x10, seated banded abduction 2x10    Consulted and Agree with Plan of Care Patient             Patient will benefit from skilled therapeutic intervention in order to improve the following deficits and impairments:  Abnormal gait, Decreased endurance, Decreased strength, Pain, Decreased mobility, Impaired perceived functional ability, Improper body mechanics, Postural dysfunction, Decreased balance, Decreased activity tolerance, Obesity  Visit Diagnosis: Chronic pain of left knee  Chronic pain of right knee  Other abnormalities of gait and mobility  Repeated falls     Problem List Patient Active Problem List   Diagnosis Date Noted   Hypertension 04/27/2021   Osteoarthritis of left knee 02/25/2021   Knee pain, bilateral 01/15/2021   BMI 40.0-44.9, adult (HCC) 01/15/2021   Elevated BP without diagnosis of hypertension 01/15/2021   Sickle cell trait (HCC) 04/08/2016   DUB (dysfunctional uterine bleeding) 04/01/2014   Multiple sclerosis (HCC)     Dessie Coma  PT DPT  05/12/2021, 10:45 AM  Ermelinda Das  05/12/2021 During this treatment session, the therapist was present, participating in and directing the treatment.    [facility] [address] [place], [place], [address] Phone: [phone]   Fax:  [phone]  Name: Meredith Wright MRN: [id number] Date of Birth: 09/06/1971

## 2021-05-17 ENCOUNTER — Other Ambulatory Visit: Payer: Self-pay

## 2021-05-17 ENCOUNTER — Ambulatory Visit (HOSPITAL_BASED_OUTPATIENT_CLINIC_OR_DEPARTMENT_OTHER): Payer: Medicare Other | Admitting: Physical Therapy

## 2021-05-17 ENCOUNTER — Encounter (HOSPITAL_BASED_OUTPATIENT_CLINIC_OR_DEPARTMENT_OTHER): Payer: Self-pay | Admitting: Physical Therapy

## 2021-05-17 DIAGNOSIS — M25561 Pain in right knee: Secondary | ICD-10-CM

## 2021-05-17 DIAGNOSIS — R296 Repeated falls: Secondary | ICD-10-CM

## 2021-05-17 DIAGNOSIS — M25562 Pain in left knee: Secondary | ICD-10-CM

## 2021-05-17 DIAGNOSIS — R2689 Other abnormalities of gait and mobility: Secondary | ICD-10-CM

## 2021-05-17 DIAGNOSIS — G8929 Other chronic pain: Secondary | ICD-10-CM | POA: Diagnosis not present

## 2021-05-18 ENCOUNTER — Encounter (HOSPITAL_BASED_OUTPATIENT_CLINIC_OR_DEPARTMENT_OTHER): Payer: Self-pay | Admitting: Physical Therapy

## 2021-05-18 ENCOUNTER — Ambulatory Visit (INDEPENDENT_AMBULATORY_CARE_PROVIDER_SITE_OTHER): Payer: Medicare Other | Admitting: Family Medicine

## 2021-05-18 ENCOUNTER — Encounter (HOSPITAL_BASED_OUTPATIENT_CLINIC_OR_DEPARTMENT_OTHER): Payer: Self-pay | Admitting: Family Medicine

## 2021-05-18 VITALS — BP 138/88 | HR 79 | Ht 67.0 in | Wt 298.0 lb

## 2021-05-18 DIAGNOSIS — M1712 Unilateral primary osteoarthritis, left knee: Secondary | ICD-10-CM

## 2021-05-18 DIAGNOSIS — I10 Essential (primary) hypertension: Secondary | ICD-10-CM | POA: Diagnosis not present

## 2021-05-18 MED ORDER — AMLODIPINE BESYLATE 5 MG PO TABS: 5.0000 mg | ORAL_TABLET | Freq: Every day | ORAL | 1 refills | Status: DC

## 2021-05-18 NOTE — Assessment & Plan Note (Signed)
Has been working with physical therapy, feels this has been going well Has sessions about once weekly, also doing home exercise program as per PT Encouraged to continue with in office treatment and home exercise program Can utilize OTC measures as needed for any increase in symptoms

## 2021-05-18 NOTE — Assessment & Plan Note (Signed)
Home readings are 130s/80s She denies any issues with chest pain, headaches.  No shortness of breath, presyncope.  Did have brief episode of lightheadedness yesterday, this resolved spontaneously. Cardiovascular exam with regular rate and rhythm, no murmurs appreciated Appears to be doing well with amlodipine 5 mg Blood pressure better controlled in office today Will continue with present medication dosing Continue to monitor home blood pressures Plan for follow-up in about 2 months to monitor in the office

## 2021-05-18 NOTE — Patient Instructions (Signed)
  Medication Instructions:  Your physician recommends that you continue on your current medications as directed. Please refer to the Current Medication list given to you today. --If you need a refill on any your medications before your next appointment, please call your pharmacy first. If no refills are authorized on file call the office.-- Follow-Up: Your next appointment:   Your physician recommends that you schedule a follow-up appointment in: 2 MONTHS with Dr. de [place]  Thanks for letting us be apart of your health journey!!  Primary Care and Sports Medicine   Dr. Raymond de [place]   We encourage you to activate your patient portal called "MyChart".  Sign up information is provided on this After Visit Summary.  MyChart is used to connect with patients for Virtual Visits (Telemedicine).  Patients are able to view lab/test results, encounter notes, upcoming appointments, etc.  Non-urgent messages can be sent to your provider as well. To learn more about what you can do with MyChart, please visit --  [email or web address].    

## 2021-05-18 NOTE — Therapy (Signed)
[facility] [address] [place], [place], [address] Phone: [phone]   Fax:  [phone]  Physical Therapy Treatment  Patient Details  Name: Meredith Wright MRN: [id number] Date of Birth: [birth year] Referring Provider (PT): [place] for knee pain, Juleen China MS   Encounter Date: 05/17/2021   PT End of Session - 05/17/21 1540     Visit Number 2    Number of Visits 6    Date for PT Re-Evaluation 06/22/21    Authorization Type UHC Medicare 2022 [place] Medicaid    PT Start Time 1102    PT Stop Time 1145    PT Time Calculation (min) 43 min    Activity Tolerance Patient tolerated treatment well    Behavior During Therapy [facility] for tasks assessed/performed             Past Medical History:  Diagnosis Date   Multiple sclerosis (HCC)    Sickle cell anemia (HCC)    trait    Past Surgical History:  Procedure Laterality Date   BRAIN SURGERY  11/07/1996   Biopsy -Dx MS   CESAREAN SECTION      There were no vitals filed for this visit.   Subjective Assessment - 05/17/21 1344     Subjective Patient reports popping and crackling of left knee while she walks. She states that she feels similar compared to last time but has less pain. She wants to improve her strenth so she can go bowling and play basketball with her family.    Pertinent History MS (rapid),    Limitations Walking;Lifting;Standing    How long can you stand comfortably? limited    How long can you walk comfortably? limited    Diagnostic tests X ray: mild-moderate OA    Patient Stated Goals decrease pain, walk dog    Currently in Pain? Yes    Pain Score 2     Pain Location Knee    Pain Orientation Right;Left    Pain Descriptors / Indicators Aching    Pain Type Chronic pain    Pain Onset More than a month ago    Pain Frequency Intermittent    Aggravating Factors  walking, standing    Pain Relieving Factors rest,cool enviorment    Effect of Pain on Daily  Activities difficult to walk    Multiple Pain Sites No                               [facility] Adult PT Treatment/Exercise - 05/18/21 0001       Exercises   Exercises Lumbar;Knee/Hip      Lumbar Exercises: Supine   Clam 15 reps    Clam Limitations 2, green band    Bridge with [employer] Limitations 2 sets, green band    Bridge with clamshell 15 reps    Advanced Lumbar Stabilization Limitations 2 sets of 10 on each leg      Knee/Hip Exercises: Seated   Clamshell with TheraBand Green   2x10   Marching Both;2 sets;10 reps      Knee/Hip Exercises: Supine   Quad Sets Both;2 sets;10 reps;AROM;Other (comment)   with ball     Manual Therapy   Manual Therapy Joint mobilization;Taping    Manual therapy comments mobilization of patella, medial/lateral and AP mobs   restriction with both mobilization   McConnell McConnel Taping for medial tilt (from lateral to medial)   patient responded postivitly  during 6 minute walk test                   PT Education - 05/17/21 1538     Education Details HEP review and benefits of pateller/mcconnel taping.    Person(s) Educated Patient    Methods Explanation;Demonstration;Tactile cues    Comprehension Verbalized understanding;Verbal cues required;Returned demonstration;Tactile cues required              PT Short Term Goals - 05/11/21 1523       PT SHORT TERM GOAL #1   Title Patient will report decreased pain 2/10.    Baseline 4    Time 3    Period Weeks    Status New    Target Date 06/01/21      PT SHORT TERM GOAL #2   Title Patient will show increased bilateral hip flexion 4+/5.    Baseline 3+/5    Time 3    Period Weeks    Status New    Target Date 06/01/21               PT Long Term Goals - 05/11/21 1527       PT LONG TERM GOAL #1   Title Patient will show increased strength of bilateral hip throughout 5/5 to improve ambulation for ADLs.    Baseline 4/5    Time 6    Period Weeks     Status New    Target Date 06/22/21      PT LONG TERM GOAL #2   Title Pt will improve gait mechanics to be able to ambulate 1000 feet around community.    Time 6    Period Weeks    Status New    Target Date 06/22/21      PT LONG TERM GOAL #3   Title Pt will demostrate thorough understanding of final HEP.    Time 6    Period Weeks    Status New    Target Date 06/22/21      PT LONG TERM GOAL #4   Title Patient will improve strength and balance in order to reduce falls risk during ambulation in community.    Baseline fall 2 weeks ago    Time 6    Period Weeks    Status New    Target Date 06/22/21                   Plan - 05/17/21 1544     Clinical Impression Statement Patient completed treatment session with no increase in pain or complaints. The 6 minute walk test results was 1132 feet with no fatigue. She continues to improve her strength but future visits should focus on incorporating standing thera-ex and gait training. Mcconnel taping is suggest as she reported postitive results during treatment. Patient should continue to progress as tolerated.    Personal Factors and Comorbidities Comorbidity 2    Comorbidities Multiple sclerosisrapid, hypertension, osteoathritis    Examination-Activity Limitations Squat;Stairs;Stand;Locomotion Level    Examination-Participation Research scientist (physical sciences);Yard Work    Conservation officer, historic buildings Evolving/Moderate complexity    Clinical Decision Making Moderate    Rehab Potential Good    PT Frequency 1x / week    PT Duration 6 weeks    PT Treatment/Interventions Gait training;Stair training;Cryotherapy;Therapeutic activities;Functional mobility training;Therapeutic exercise;Balance training;Manual techniques;Patient/family education;Ultrasound;Contrast Bath;ADLs/Self Care Home Management;Taping;Energy conservation;Passive range of motion;Joint Manipulations    PT Next Visit Plan Next step is progress to  standing thera-ex (double leg calf  raise, step ups, marches) and proper gait/balance training (tandem, hurdles, single leg balance ). Standing weight shiftts. Progress to standing exercises as tolerated. Add nu-step but monitor patient.    PT Home Exercise Plan Quad sets 2x10, seat banded march 2x10, seated banded abduction 2x10, supine green banded abduction 2x10, banded glute raise 2x10,.    Consulted and Agree with Plan of Care Patient             Patient will benefit from skilled therapeutic intervention in order to improve the following deficits and impairments:  Abnormal gait, Decreased endurance, Decreased strength, Pain, Decreased mobility, Impaired perceived functional ability, Improper body mechanics, Postural dysfunction, Decreased balance, Decreased activity tolerance, Obesity  Visit Diagnosis: Chronic pain of left knee  Chronic pain of right knee  Other abnormalities of gait and mobility  Repeated falls     Problem List Patient Active Problem List   Diagnosis Date Noted   Hypertension 04/27/2021   Osteoarthritis of left knee 02/25/2021   Knee pain, bilateral 01/15/2021   BMI 40.0-44.9, adult (HCC) 01/15/2021   Sickle cell trait (HCC) 04/08/2016   DUB (dysfunctional uterine bleeding) 04/01/2014   Multiple sclerosis (HCC)     Dessie Coma PT DPT  05/18/2021, 9:53 AM  [facility] [address] [place], [place], [address] Phone: [phone]   Fax:  [phone]  Name: Meredith Wright MRN: [id number] Date of Birth: 1971/09/06

## 2021-05-18 NOTE — Progress Notes (Signed)
**Note Wright-Identified via Obfuscation**     Procedures performed today:    None.  Independent interpretation of notes and tests performed by another provider:   None.  Brief History, Exam, Impression, and Recommendations:    BP 138/88   Pulse 79   Ht 5\' 7"  (1.702 m)   Wt 298 lb (135.2 kg)   SpO2 99%   BMI 46.67 kg/m   Hypertension Home readings are 130s/80s She denies any issues with chest pain, headaches.  No shortness of breath, presyncope.  Did have brief episode of lightheadedness yesterday, this resolved spontaneously. Cardiovascular exam with regular rate and rhythm, no murmurs appreciated Appears to be doing well with amlodipine 5 mg Blood pressure better controlled in office today Will continue with present medication dosing Continue to monitor home blood pressures Plan for follow-up in about 2 months to monitor in the office  Osteoarthritis of left knee Has been working with physical therapy, feels this has been going well Has sessions about once weekly, also doing home exercise program as per PT Encouraged to continue with in office treatment and home exercise program Can utilize OTC measures as needed for any increase in symptoms  Plan for follow-up in about 2 months or sooner as needed  ___________________________________________ Meredith Wright , MD, ABFM, CAQSM Primary Care and Sports Medicine [facility]

## 2021-05-19 ENCOUNTER — Other Ambulatory Visit (HOSPITAL_BASED_OUTPATIENT_CLINIC_OR_DEPARTMENT_OTHER): Payer: Self-pay | Admitting: Family Medicine

## 2021-05-19 DIAGNOSIS — I10 Essential (primary) hypertension: Secondary | ICD-10-CM

## 2021-05-24 ENCOUNTER — Other Ambulatory Visit: Payer: Self-pay

## 2021-05-24 ENCOUNTER — Encounter (HOSPITAL_BASED_OUTPATIENT_CLINIC_OR_DEPARTMENT_OTHER): Payer: Self-pay | Admitting: Physical Therapy

## 2021-05-24 ENCOUNTER — Ambulatory Visit (HOSPITAL_BASED_OUTPATIENT_CLINIC_OR_DEPARTMENT_OTHER): Payer: Medicare Other | Admitting: Physical Therapy

## 2021-05-24 DIAGNOSIS — R296 Repeated falls: Secondary | ICD-10-CM

## 2021-05-24 DIAGNOSIS — G8929 Other chronic pain: Secondary | ICD-10-CM

## 2021-05-24 DIAGNOSIS — M25562 Pain in left knee: Secondary | ICD-10-CM

## 2021-05-24 DIAGNOSIS — R2689 Other abnormalities of gait and mobility: Secondary | ICD-10-CM

## 2021-05-24 NOTE — Therapy (Signed)
[facility] [address] [place], [place], [address] Phone: [phone]   Fax:  [phone]  Physical Therapy Treatment  Patient Details  Name: Meredith Wright MRN: [id number] Date of Birth: Sep 24, 1971 Referring Provider (PT): [place] for knee pain, Juleen China MS   Encounter Date: 05/24/2021   PT End of Session - 05/24/21 1119     Visit Number 3    Number of Visits 6    Date for PT Re-Evaluation 06/22/21    Authorization Type UHC Medicare 2022 [place] Medicaid    PT Start Time 1100    PT Stop Time 1141    PT Time Calculation (min) 41 min    Activity Tolerance Patient tolerated treatment well    Behavior During Therapy [facility] for tasks assessed/performed             Past Medical History:  Diagnosis Date   Multiple sclerosis (HCC)    Sickle cell anemia (HCC)    trait    Past Surgical History:  Procedure Laterality Date   BRAIN SURGERY  11/07/1996   Biopsy -Dx MS   CESAREAN SECTION      There were no vitals filed for this visit.   Subjective Assessment - 05/24/21 1120     Subjective Patient reports she is feeling better today. She reports no pain and no incident of falling or numbness in her legs. She states that she is able to do HEP with no issues.    Pertinent History MS (rapid),    Limitations Walking;Lifting;Standing    How long can you stand comfortably? limited    How long can you walk comfortably? limited    Diagnostic tests X ray: mild-moderate OA    Patient Stated Goals decrease pain, walk dog    Currently in Pain? No/denies                               [facility] Adult PT Treatment/Exercise - 05/24/21 0001       Exercises   Exercises Lumbar;Knee/Hip      Lumbar Exercises: Aerobic   Nustep 5 mins level 1      Lumbar Exercises: Standing   Other Standing Lumbar Exercises standing marches with 1-2 sec hold 3x10, step ups 2x10 4 inch step    Other Standing Lumbar Exercises hurdles  6 sets of 3 forward, lateral hurdles 6 sets of 3      Lumbar Exercises: Supine   Clam 15 reps    Clam Limitations 2, green band    Bridge with clamshell 15 reps;Compliant   2 sets, green band     Knee/Hip Exercises: Seated   Clamshell with TheraBand Green    Marching Both;2 sets;10 reps      Manual Therapy   Manual Therapy Joint mobilization;Taping    Manual therapy comments mobilization of patella, medial/lateral and AP mobs    McConnell McConnel Taping for medial tilt (from lateral to medial)                    PT Education - 05/24/21 1120     Education Details HEP review and educated on increasing intensity of HEP.    Person(s) Educated Patient    Methods Explanation;Demonstration;Tactile cues    Comprehension Verbalized understanding;Returned demonstration;Verbal cues required;Tactile cues required              PT Short Term Goals - 05/11/21 1523  PT SHORT TERM GOAL #1   Title Patient will report decreased pain 2/10.    Baseline 4    Time 3    Period Weeks    Status New    Target Date 06/01/21      PT SHORT TERM GOAL #2   Title Patient will show increased bilateral hip flexion 4+/5.    Baseline 3+/5    Time 3    Period Weeks    Status New    Target Date 06/01/21               PT Long Term Goals - 05/11/21 1527       PT LONG TERM GOAL #1   Title Patient will show increased strength of bilateral hip throughout 5/5 to improve ambulation for ADLs.    Baseline 4/5    Time 6    Period Weeks    Status New    Target Date 06/22/21      PT LONG TERM GOAL #2   Title Pt will improve gait mechanics to be able to ambulate 1000 feet around community.    Time 6    Period Weeks    Status New    Target Date 06/22/21      PT LONG TERM GOAL #3   Title Pt will demostrate thorough understanding of final HEP.    Time 6    Period Weeks    Status New    Target Date 06/22/21      PT LONG TERM GOAL #4   Title Patient will improve strength and  balance in order to reduce falls risk during ambulation in community.    Baseline fall 2 weeks ago    Time 6    Period Weeks    Status New    Target Date 06/22/21                   Plan - 05/24/21 1134     Clinical Impression Statement Patient tolerated therapy well today. She was able to perform walking hurdles with no increase in pain or fatigue. Patient's pain has decreased since initial visit to 0/10. Patient should continue to progress HEP as tolerated with emphasis on weight bearing exercises such as step ups, hurdles, and sit to stand.    Personal Factors and Comorbidities Comorbidity 2    Comorbidities Multiple sclerosisrapid, hypertension, osteoathritis    Examination-Activity Limitations Squat;Stairs;Stand;Locomotion Level    Examination-Participation Research scientist (physical sciences);Yard Work    Conservation officer, historic buildings Evolving/Moderate complexity    Clinical Decision Making Moderate    Rehab Potential Good    PT Frequency 1x / week    PT Duration 6 weeks    PT Treatment/Interventions Gait training;Stair training;Cryotherapy;Therapeutic activities;Functional mobility training;Therapeutic exercise;Balance training;Manual techniques;Patient/family education;Ultrasound;Contrast Bath;ADLs/Self Care Home Management;Taping;Energy conservation;Passive range of motion;Joint Manipulations    PT Next Visit Plan Next step is progress to standing thera-ex (double leg calf raise, step ups, marches) and proper gait/balance training (tandem, hurdles, single leg balance ). Standing weight shiftts. Progress to standing exercises as tolerated. Add nu-step but monitor patient.    PT Home Exercise Plan Quad sets 3x10, seat banded march 3x10, seated banded abduction 3x10, supine green banded abduction 3x10, banded glute raise 3x10, standing marches 3x10, step ups 3x10.    Consulted and Agree with Plan of Care Patient             Patient will benefit from skilled  therapeutic intervention in order to improve the following deficits  and impairments:  Abnormal gait, Decreased endurance, Decreased strength, Pain, Decreased mobility, Impaired perceived functional ability, Improper body mechanics, Postural dysfunction, Decreased balance, Decreased activity tolerance, Obesity  Visit Diagnosis: Chronic pain of left knee  Other abnormalities of gait and mobility  Chronic pain of right knee  Repeated falls     Problem List Patient Active Problem List   Diagnosis Date Noted   Hypertension 04/27/2021   Osteoarthritis of left knee 02/25/2021   Knee pain, bilateral 01/15/2021   BMI 40.0-44.9, adult (HCC) 01/15/2021   Sickle cell trait (HCC) 04/08/2016   DUB (dysfunctional uterine bleeding) 04/01/2014   Multiple sclerosis (HCC)     Lorayne Bender PT DPT  05/24/2020  Reginia Naas SPT 05/24/2021, 4:52 PM  During this treatment session, the therapist was present, participating in and directing the treatment.   [facility] [address] [place], [place], [address] Phone: [phone]   Fax:  [phone]  Name: Meredith Wright MRN: [id number] Date of Birth: January 16, 1971

## 2021-05-25 ENCOUNTER — Encounter (HOSPITAL_BASED_OUTPATIENT_CLINIC_OR_DEPARTMENT_OTHER): Payer: Self-pay | Admitting: Physical Therapy

## 2021-05-27 ENCOUNTER — Telehealth (HOSPITAL_BASED_OUTPATIENT_CLINIC_OR_DEPARTMENT_OTHER): Payer: Self-pay | Admitting: Obstetrics & Gynecology

## 2021-05-27 ENCOUNTER — Encounter (HOSPITAL_BASED_OUTPATIENT_CLINIC_OR_DEPARTMENT_OTHER): Payer: Self-pay

## 2021-05-27 NOTE — Telephone Encounter (Signed)
Ultrasound ordered for pt for additional evaluation of bleeding.  Pt thought she had a hysterectomy but has a cervix on exam.  Felt she likely did not have a hysterectomy but could possibly have undergone a supracervical.  Pt has not gone for ultrasound.  Confirmed with Zane Herald who schedules these that pt has been called multiple times without return phone call from patient.  My chart message has been sent as well.  Order has been cancelled at this point.

## 2021-05-31 ENCOUNTER — Other Ambulatory Visit: Payer: Self-pay

## 2021-05-31 ENCOUNTER — Ambulatory Visit (HOSPITAL_BASED_OUTPATIENT_CLINIC_OR_DEPARTMENT_OTHER): Payer: Medicare Other | Admitting: Physical Therapy

## 2021-05-31 ENCOUNTER — Encounter (HOSPITAL_BASED_OUTPATIENT_CLINIC_OR_DEPARTMENT_OTHER): Payer: Self-pay | Admitting: Physical Therapy

## 2021-05-31 DIAGNOSIS — G8929 Other chronic pain: Secondary | ICD-10-CM | POA: Diagnosis not present

## 2021-05-31 DIAGNOSIS — R2689 Other abnormalities of gait and mobility: Secondary | ICD-10-CM

## 2021-05-31 DIAGNOSIS — M25562 Pain in left knee: Secondary | ICD-10-CM

## 2021-05-31 DIAGNOSIS — R296 Repeated falls: Secondary | ICD-10-CM

## 2021-06-01 ENCOUNTER — Encounter (HOSPITAL_BASED_OUTPATIENT_CLINIC_OR_DEPARTMENT_OTHER): Payer: Self-pay | Admitting: Physical Therapy

## 2021-06-01 NOTE — Therapy (Signed)
[facility] [address] [place], [place], [address] Phone: [phone]   Fax:  [phone]  Physical Therapy Treatment  Patient Details  Name: Meredith Wright MRN: [id number] Date of Birth: 1971/10/14 Referring Provider (PT): [place] for knee pain, Juleen China MS   Encounter Date: 05/31/2021   PT End of Session - 05/31/21 1437     Visit Number 4    Number of Visits 6    Date for PT Re-Evaluation 06/22/21    Authorization Type UHC Medicare 2022 [place] Medicaid    PT Start Time 1435    PT Stop Time 1515    PT Time Calculation (min) 40 min    Activity Tolerance Patient tolerated treatment well    Behavior During Therapy [facility] for tasks assessed/performed             Past Medical History:  Diagnosis Date   Multiple sclerosis (HCC)    Sickle cell anemia (HCC)    trait    Past Surgical History:  Procedure Laterality Date   BRAIN SURGERY  11/07/1996   Biopsy -Dx MS   CESAREAN SECTION      There were no vitals filed for this visit.   Subjective Assessment - 05/31/21 1707     Subjective Patient reports that she is feeling stronger during visit. She reports no pain, no falls, no numbnes in her legs. She states that her HEP is become easy to complete.    Pertinent History MS (rapid),    Limitations Walking;Lifting;Standing    How long can you stand comfortably? limited    How long can you walk comfortably? limited    Diagnostic tests X ray: mild-moderate OA    Patient Stated Goals decrease pain, walk dog    Currently in Pain? No/denies    Multiple Pain Sites No                               [facility] Adult PT Treatment/Exercise - 06/01/21 0001       Lumbar Exercises: Aerobic   Nustep 5 mins level 2      Lumbar Exercises: Standing   Other Standing Lumbar Exercises standing marches with 1-2 sec hold 3x10 with blue band, lateral band walks with blue band 4x10    Other Standing Lumbar Exercises  hurdles 6 sets of 3 forward, lateral hurdles 6 sets of 3      Lumbar Exercises: Supine   Clam 15 reps    Clam Limitations 2, blue band      Knee/Hip Exercises: Standing   Other Standing Knee Exercises lateral band walk 3x10 blue; standing heel raise x20; slow marching x20;      Knee/Hip Exercises: Seated   Clamshell with TheraBand Blue   2x10     Knee/Hip Exercises: Supine   Bridges Limitations blue band    Bridges with Clamshell Strengthening;Both;2 sets                    PT Education - 05/31/21 1710     Education Details Reviewed balance exercises and weight bearing exercises for HEP    Person(s) Educated Patient    Methods Demonstration;Explanation;Tactile cues    Comprehension Verbalized understanding;Returned demonstration;Verbal cues required;Tactile cues required              PT Short Term Goals - 05/11/21 1523       PT SHORT TERM GOAL #1   Title  Patient will report decreased pain 2/10.    Baseline 4    Time 3    Period Weeks    Status New    Target Date 06/01/21      PT SHORT TERM GOAL #2   Title Patient will show increased bilateral hip flexion 4+/5.    Baseline 3+/5    Time 3    Period Weeks    Status New    Target Date 06/01/21               PT Long Term Goals - 05/11/21 1527       PT LONG TERM GOAL #1   Title Patient will show increased strength of bilateral hip throughout 5/5 to improve ambulation for ADLs.    Baseline 4/5    Time 6    Period Weeks    Status New    Target Date 06/22/21      PT LONG TERM GOAL #2   Title Pt will improve gait mechanics to be able to ambulate 1000 feet around community.    Time 6    Period Weeks    Status New    Target Date 06/22/21      PT LONG TERM GOAL #3   Title Pt will demostrate thorough understanding of final HEP.    Time 6    Period Weeks    Status New    Target Date 06/22/21      PT LONG TERM GOAL #4   Title Patient will improve strength and balance in order to reduce  falls risk during ambulation in community.    Baseline fall 2 weeks ago    Time 6    Period Weeks    Status New    Target Date 06/22/21                   Plan - 05/31/21 1517     Clinical Impression Statement Patient is making good progress. 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 She tolerated new exercises well. Patient should continue with HEP as tolerated. Therapy added lateral band walks, hurdle walks and balance exercises in the II bars. She had no increase in knee pain.    Personal Factors and Comorbidities Comorbidity 2    Comorbidities Multiple sclerosis rapid, hypertension, osteoathritis    Examination-Activity Limitations Squat;Stairs;Stand;Locomotion Level    Examination-Participation Research scientist (physical sciences);Yard Work    Conservation officer, historic buildings Evolving/Moderate complexity    Clinical Decision Making Moderate    Rehab Potential Good    PT Frequency 1x / week    PT Duration 6 weeks    PT Treatment/Interventions Gait training;Stair training;Cryotherapy;Therapeutic activities;Functional mobility training;Therapeutic exercise;Balance training;Manual techniques;Patient/family education;Ultrasound;Contrast Bath;ADLs/Self Care Home Management;Taping;Energy conservation;Passive range of motion;Joint Manipulations    PT Next Visit Plan Next step is progress to standing thera-ex (double leg calf raise, step ups, marches) and proper gait/balance training (tandem, hurdles, single leg balance ). Standing weight shiftts. Progress to standing exercises as tolerated. Add nu-step but monitor patient.    PT Home Exercise Plan Quad sets 3x10, seat banded march 3x10, seated banded abduction 3x10, supine green banded abduction 3x10, banded glute raise 3x10, standing marches 3x10, step ups 3x10. Access Code: [email or web address]  URL: [email or web address]  Date: 05/31/2021  Prepared by: Lorayne Bender    Exercises   Clamshell with Resistance - 1 x daily - 7 x weekly - 3 sets - 10 reps  Sidestepping in Squat with Resistance  and Arms Forward - 1 x daily - 7 x weekly - 3 sets - 10 reps  Standing March with Counter Support - 1 x daily - 7 x weekly - 3 sets - 10 reps  Hurdles - 1 x daily - 7 x weekly - 3 sets - 10 reps    Consulted and Agree with Plan of Care Patient             Patient will benefit from skilled therapeutic intervention in order to improve the following deficits and impairments:  Abnormal gait, Decreased endurance, Decreased strength, Pain, Decreased mobility, Impaired perceived functional ability, Improper body mechanics, Postural dysfunction, Decreased balance, Decreased activity tolerance, Obesity  Visit Diagnosis: Chronic pain of left knee  Other abnormalities of gait and mobility  Chronic pain of right knee  Repeated falls     Problem List Patient Active Problem List   Diagnosis Date Noted   Hypertension 04/27/2021   Osteoarthritis of left knee 02/25/2021   Knee pain, bilateral 01/15/2021   BMI 40.0-44.9, adult (HCC) 01/15/2021   Sickle cell trait (HCC) 04/08/2016   DUB (dysfunctional uterine bleeding) 04/01/2014   Multiple sclerosis (HCC)     Dessie Coma PT DPT  06/01/2021, 10:44 AM  Ermelinda Das  06/01/2021   During this treatment session, the therapist was present, participating in and directing the treatment.    [facility] [address] [place], [place], [address] Phone: [phone]   Fax:  [phone]  Name: Meredith Wright MRN: [id number] Date of Birth: 09-22-1971

## 2021-06-03 ENCOUNTER — Telehealth: Payer: Self-pay | Admitting: *Deleted

## 2021-06-03 NOTE — Telephone Encounter (Signed)
-----   Message from Jerene Bears, MD sent at 05/27/2021  5:32 AM EDT ----- Regarding: ultrasound Meredith Wright, This pt was seen in June.  She reported hx of irregular bleeding but having a hysterectomy.  She has a cervix on exam.  I recommended an ultrasound.  She has not scheduled this yet.  Can you call her and see if you can help get her scheduled?  Thanks.  Dr. Hyacinth Meeker

## 2021-06-03 NOTE — Telephone Encounter (Signed)
DPR reviewed. LMOVM for pt to return our call so we could get appt scheduled for u/s

## 2021-06-07 ENCOUNTER — Other Ambulatory Visit: Payer: Self-pay

## 2021-06-07 ENCOUNTER — Ambulatory Visit (HOSPITAL_BASED_OUTPATIENT_CLINIC_OR_DEPARTMENT_OTHER): Payer: Medicare Other | Attending: Family Medicine | Admitting: Physical Therapy

## 2021-06-07 DIAGNOSIS — M25561 Pain in right knee: Secondary | ICD-10-CM | POA: Insufficient documentation

## 2021-06-07 DIAGNOSIS — R296 Repeated falls: Secondary | ICD-10-CM | POA: Insufficient documentation

## 2021-06-07 DIAGNOSIS — G8929 Other chronic pain: Secondary | ICD-10-CM | POA: Insufficient documentation

## 2021-06-07 DIAGNOSIS — M25562 Pain in left knee: Secondary | ICD-10-CM | POA: Insufficient documentation

## 2021-06-07 DIAGNOSIS — R2689 Other abnormalities of gait and mobility: Secondary | ICD-10-CM | POA: Diagnosis present

## 2021-06-07 NOTE — Therapy (Signed)
[facility] [address] [place], [place], [address] Phone: [phone]   Fax:  [phone]  Physical Therapy Treatment  Patient Details  Name: Meredith Wright MRN: [id number] Date of Birth: 1971-08-17 Referring Provider (PT): [place] for knee pain, Juleen China MS   Encounter Date: 06/07/2021   PT End of Session - 06/07/21 1105     Visit Number 5    Number of Visits 6    Date for PT Re-Evaluation 06/22/21    Authorization Type UHC Medicare 2022 [place] Medicaid    PT Start Time 1100    PT Stop Time 1140    PT Time Calculation (min) 40 min    Activity Tolerance Patient tolerated treatment well    Behavior During Therapy [facility] for tasks assessed/performed             Past Medical History:  Diagnosis Date   Multiple sclerosis (HCC)    Sickle cell anemia (HCC)    trait    Past Surgical History:  Procedure Laterality Date   BRAIN SURGERY  11/07/1996   Biopsy -Dx MS   CESAREAN SECTION      There were no vitals filed for this visit.   Subjective Assessment - 06/07/21 1306     Subjective Patient reports that she has no pain during visit. She reports no falls or numbness in her legs. She states that she wants to increase intensity of HEP in order to try to bowl and participate in more activites with her family.    Pertinent History MS (rapid),    Limitations Walking;Lifting;Standing    How long can you stand comfortably? limited    How long can you walk comfortably? limited    Diagnostic tests X ray: mild-moderate OA    Patient Stated Goals decrease pain, walk dog    Currently in Pain? No/denies    Multiple Pain Sites No                               [facility] Adult PT Treatment/Exercise - 06/07/21 0001       Lumbar Exercises: Aerobic   Nustep 5 mins level 2      Knee/Hip Exercises: Standing   Other Standing Knee Exercises standing marches with 1-2 sec hold 3x10 with blue band, lateral band walks  with blue band 4x10    Other Standing Knee Exercises hurdles 6 sets of 3 forward, lateral hurdles 6 sets of 3      Knee/Hip Exercises: Seated   Clamshell with TheraBand Blue    Abduction/Adduction  Strengthening;Both;2 sets;15 reps;Other (comment)    Abd/Adduction Limitations 2x15 blue banded abduction      Manual Therapy   Manual Therapy Joint mobilization;Taping    Manual therapy comments McConnel Taping with patient education on how to perform at home.    McConnell McConnel Taping for medial tilt (from lateral to medial)                    PT Education - 06/07/21 1104     Education Details Reviewed HEP and balance exercises.    Person(s) Educated Patient    Methods Explanation;Demonstration;Verbal cues    Comprehension Verbalized understanding;Returned demonstration;Verbal cues required              PT Short Term Goals - 05/11/21 1523       PT SHORT TERM GOAL #1   Title Patient will report decreased  pain 2/10.    Baseline 4    Time 3    Period Weeks    Status New    Target Date 06/01/21      PT SHORT TERM GOAL #2   Title Patient will show increased bilateral hip flexion 4+/5.    Baseline 3+/5    Time 3    Period Weeks    Status New    Target Date 06/01/21               PT Long Term Goals - 05/11/21 1527       PT LONG TERM GOAL #1   Title Patient will show increased strength of bilateral hip throughout 5/5 to improve ambulation for ADLs.    Baseline 4/5    Time 6    Period Weeks    Status New    Target Date 06/22/21      PT LONG TERM GOAL #2   Title Pt will improve gait mechanics to be able to ambulate 1000 feet around community.    Time 6    Period Weeks    Status New    Target Date 06/22/21      PT LONG TERM GOAL #3   Title Pt will demostrate thorough understanding of final HEP.    Time 6    Period Weeks    Status New    Target Date 06/22/21      PT LONG TERM GOAL #4   Title Patient will improve strength and balance in  order to reduce falls risk during ambulation in community.    Baseline fall 2 weeks ago    Time 6    Period Weeks    Status New    Target Date 06/22/21                   Plan - 06/07/21 1106     Clinical Impression Statement Patient continues to make good progress. She was able to complete dynamic strengthening exercises without an increase in pain and with no signs of buckling. She had some questions about activity outside of therapy. She would like to get back into beach body. She was advised she should start slow with whatever she does and see how she tolerates it. She would also like to bowl. Again she was advised to try to bowl a game and see how it goes. Therapy will re-assess next visit.    Personal Factors and Comorbidities Comorbidity 2    Comorbidities Multiple sclerosis rapid, hypertension, osteoathritis    Examination-Activity Limitations Squat;Stairs;Stand;Locomotion Level    Examination-Participation Research scientist (physical sciences);Yard Work    Conservation officer, historic buildings Evolving/Moderate complexity    Rehab Potential Good    PT Frequency 1x / week    PT Duration 6 weeks    PT Treatment/Interventions Gait training;Stair training;Cryotherapy;Therapeutic activities;Functional mobility training;Therapeutic exercise;Balance training;Manual techniques;Patient/family education;Ultrasound;Contrast Bath;ADLs/Self Care Home Management;Taping;Energy conservation;Passive range of motion;Joint Manipulations    PT Next Visit Plan Next step is progress to standing thera-ex (double leg calf raise, step ups, marches) and proper gait/balance training (tandem, hurdles, single leg balance ). Standing weight shiftts. Progress to standing exercises as tolerated. Add nu-step but monitor patient.    PT Home Exercise Plan Quad sets 3x10, seat banded march 3x10, seated banded abduction 3x10, supine green banded abduction 3x10, banded glute raise 3x10, standing marches 3x10, step  ups 3x10. Access Code: [email or web address]  URL: [email or web address]  Date: 05/31/2021  Prepared by: Lorayne Bender    Exercises  Clamshell  with Resistance - 1 x daily - 7 x weekly - 3 sets - 10 reps  Sidestepping in Squat with Resistance and Arms Forward - 1 x daily - 7 x weekly - 3 sets - 10 reps  Standing March with Counter Support - 1 x daily - 7 x weekly - 3 sets - 10 reps  Hurdles - 1 x daily - 7 x weekly - 3 sets - 10 reps    Consulted and Agree with Plan of Care Patient             Patient will benefit from skilled therapeutic intervention in order to improve the following deficits and impairments:  Abnormal gait, Decreased endurance, Decreased strength, Pain, Decreased mobility, Impaired perceived functional ability, Improper body mechanics, Postural dysfunction, Decreased balance, Decreased activity tolerance, Obesity  Visit Diagnosis: Chronic pain of left knee  Other abnormalities of gait and mobility  Chronic pain of right knee  Repeated falls     Problem List Patient Active Problem List   Diagnosis Date Noted   Hypertension 04/27/2021   Osteoarthritis of left knee 02/25/2021   Knee pain, bilateral 01/15/2021   BMI 40.0-44.9, adult (HCC) 01/15/2021   Sickle cell trait (HCC) 04/08/2016   DUB (dysfunctional uterine bleeding) 04/01/2014   Multiple sclerosis (HCC)     Dessie Coma PT DTP  06/07/2021, 3:00 PM  Ermelinda Das  06/07/2021   During this treatment session, the therapist was present, participating in and directing the treatment.   [facility] [address] [place], [place], [address] Phone: [phone]   Fax:  [phone]  Name: KAILY WRAGG MRN: [id number] Date of Birth: 12-07-1970

## 2021-06-11 ENCOUNTER — Other Ambulatory Visit (HOSPITAL_BASED_OUTPATIENT_CLINIC_OR_DEPARTMENT_OTHER): Payer: Self-pay | Admitting: Family Medicine

## 2021-06-11 DIAGNOSIS — I10 Essential (primary) hypertension: Secondary | ICD-10-CM

## 2021-06-14 ENCOUNTER — Other Ambulatory Visit: Payer: Self-pay

## 2021-06-14 ENCOUNTER — Ambulatory Visit (HOSPITAL_BASED_OUTPATIENT_CLINIC_OR_DEPARTMENT_OTHER): Payer: Medicare Other | Admitting: Physical Therapy

## 2021-06-14 ENCOUNTER — Encounter (HOSPITAL_BASED_OUTPATIENT_CLINIC_OR_DEPARTMENT_OTHER): Payer: Self-pay | Admitting: Physical Therapy

## 2021-06-14 DIAGNOSIS — M25562 Pain in left knee: Secondary | ICD-10-CM

## 2021-06-14 DIAGNOSIS — R296 Repeated falls: Secondary | ICD-10-CM

## 2021-06-14 DIAGNOSIS — R2689 Other abnormalities of gait and mobility: Secondary | ICD-10-CM

## 2021-06-14 DIAGNOSIS — G8929 Other chronic pain: Secondary | ICD-10-CM

## 2021-06-14 NOTE — Telephone Encounter (Signed)
Patient scheduled to follow up 09/12 Will authorize refill until follow up is completed May authorize future refills at office visit.

## 2021-06-15 NOTE — Therapy (Signed)
[facility] [address] [place], [place], [address] Phone: [phone]   Fax:  [phone]  Physical Therapy Treatment  Patient Details  Name: Meredith Wright MRN: [id number] Date of Birth: 1971-03-02 Referring Provider (PT): Dr Raymond [place]   Encounter Date: 06/14/2021   PT End of Session - 06/14/21 1411     Visit Number 6    Number of Visits 12    Date for PT Re-Evaluation 07/26/21    Authorization Type UHC Medicare 2022 [place] Medicaid    PT Start Time 1105    PT Stop Time 1145    PT Time Calculation (min) 40 min    Activity Tolerance Patient tolerated treatment well    Behavior During Therapy [facility] for tasks assessed/performed             Past Medical History:  Diagnosis Date   Multiple sclerosis ([place])    Sickle cell anemia ([place])    trait    Past Surgical History:  Procedure Laterality Date   BRAIN SURGERY  11/07/1996   Biopsy -Dx MS   CESAREAN SECTION      There were no vitals filed for this visit.   Subjective Assessment - 06/14/21 1409     Subjective Patient reports she has no pain on visit. Patient reports she had a stumble early in the day but did not fall. Patient states that she would like to continue to improve her HEP and exercises.    Pertinent History MS (rapid),    Limitations Walking;Lifting;Standing    How long can you stand comfortably? no issues.    How long can you walk comfortably? limited.    Diagnostic tests X ray: mild-moderate OA    Patient Stated Goals decrease pain, walk dog    Currently in Pain? No/denies    Multiple Pain Sites No                [facility] PT Assessment - 06/15/21 0001       Assessment   Medical Diagnosis Knee OA    Referring Provider (PT) Dr Arlina Robes [place]      Strength   Right Hip Flexion 4/5    Right Hip ABduction 4+/5    Right Hip ADduction 4+/5    Left Hip Flexion 4/5    Left Hip ABduction 4+/5    Left Hip ADduction 4+/5    Right Knee Flexion  4+/5    Right Knee Extension 4+/5    Left Knee Flexion 4+/5    Left Knee Extension 4+/5      6 minute walk test results    Aerobic Endurance Distance Walked 1430    Endurance additional comments Patient needed no breaks for this assessment.                           [facility] Adult PT Treatment/Exercise - 06/15/21 0001       Lumbar Exercises: Aerobic   Nustep 5 mins level 2      Lumbar Exercises: Standing   Other Standing Lumbar Exercises standing hip flexion with blue band on foot 3x10, standing marches 2x10 with no band.    Other Standing Lumbar Exercises lateral band walks 2x5 with blue band      Lumbar Exercises: Seated   Other Seated Lumbar Exercises bilateral ER 2x10 red; horizontal abduction red 2x10; shoulder flexion 2x10 red    Other Seated Lumbar Exercises long arc quads with green  band for each leg x10                    PT Education - 06/14/21 1411     Education Details Reviewed HEP, strength exercises for hips, and also discussed progress and future goals she has for therapy.    Person(s) Educated Patient    Methods Explanation;Demonstration;Verbal cues    Comprehension Returned demonstration;Verbalized understanding;Verbal cues required              PT Short Term Goals - 06/14/21 1414       PT SHORT TERM GOAL #1   Title Patient will report decreased pain 2/10.    Baseline 0/10    Time 3    Period Weeks    Status Achieved      PT SHORT TERM GOAL #2   Title Patient will show increased bilateral hip flexion 4+/5.    Baseline 4/5    Time 3    Period Weeks    Status Partially Met    Target Date 07/05/21               PT Long Term Goals - 06/14/21 1415       PT LONG TERM GOAL #1   Title Patient will show increased strength of bilateral hip throughout 5/5 to improve ambulation for ADLs.    Baseline 4+/5    Time 6    Period Weeks    Status On-going    Target Date 07/26/21      PT LONG TERM GOAL #2   Title Pt will  improve gait mechanics to be able to ambulate 1000 feet around community.    Baseline 1414  feet    Time 6    Period Weeks    Status Achieved    Target Date 07/26/21      PT LONG TERM GOAL #3   Title Pt will demostrate thorough understanding of final HEP.    Baseline full understanding of inital HEP    Time 6    Period Weeks    Status On-going    Target Date 07/26/21      PT LONG TERM GOAL #4   Title Patient will improve strength and balance in order to reduce falls risk during ambulation in community.    Baseline no falls since last visit.    Time 6    Period Weeks    Status On-going    Target Date 07/26/21      PT LONG TERM GOAL #5   Title Patient will improve 6 minute walk test distance to 1800 feet in order to ambulate safely around community.    Baseline 1414    Time 3    Period Weeks    Status New    Target Date 07/26/21                   Plan - 06/14/21 1144     Clinical Impression Statement Patient continues to make progress with therapy. Patient shows improved strength in hip and knee musculature compared to intial evaluation. Patients 6 minute walk test improved.  Patient would continue to benefit from therapy in order to continue to improve strenght, balance, and endurance in lower extremity and upper extremity. Her knee hurts at times, but it is much more controlled. She continues to have high goals. We will continue to work with her towards those goals. She would benefit from further therapy 1W6. See below for goalsspecific progress. She tolerated  treatment well. She had no significant pain. Therapy added UE exercises to her HEP for general conditioning.    Personal Factors and Comorbidities Comorbidity 2    Comorbidities Multiple sclerosis rapid, hypertension, osteoathritis    Examination-Activity Limitations Squat;Stairs;Stand;Locomotion Level    Examination-Participation Medical laboratory scientific officer;Yard Work    Multimedia programmer Evolving/Moderate complexity    Clinical Decision Making Moderate    Rehab Potential Good    PT Frequency 1x / week    PT Duration 6 weeks    PT Treatment/Interventions Gait training;Stair training;Cryotherapy;Therapeutic activities;Functional mobility training;Therapeutic exercise;Balance training;Manual techniques;Patient/family education;Ultrasound;Contrast Bath;ADLs/Self Care Home Management;Taping;Energy conservation;Passive range of motion;Joint Manipulations    PT Next Visit Plan Next step is progress to standing thera-ex (double leg calf raise, step ups, marches) and proper gait/balance training (tandem, hurdles, single leg balance ). Standing weight shiftts. Progress to standing exercises as tolerated. Add nu-step but monitor patient. Higher intensity exercises.    PT Home Exercise Plan Quad sets 3x10, seat banded march 3x10, seated banded abduction 3x10, supine green banded abduction 3x10, banded glute raise 3x10, standing marches 3x10, step ups 3x10. Access Code: [email or web address]  URL: [email or web address]  Date: 05/31/2021  Prepared by: Carolyne Littles    Exercises  Clamshell with Resistance - 1 x daily - 7 x weekly - 3 sets - 10 reps  Sidestepping in Squat with Resistance and Arms Forward - 1 x daily - 7 x weekly - 3 sets - 10 reps  Standing March with Counter Support - 1 x daily - 7 x weekly - 3 sets - 10 reps  Hurdles - 1 x daily - 7 x weekly - 3 sets - 10 reps    Consulted and Agree with Plan of Care Patient             Patient will benefit from skilled therapeutic intervention in order to improve the following deficits and impairments:  Abnormal gait, Decreased endurance, Decreased strength, Pain, Decreased mobility, Impaired perceived functional ability, Improper body mechanics, Postural dysfunction, Decreased balance, Decreased activity tolerance, Obesity  Visit Diagnosis: Chronic pain of left knee  Other abnormalities of gait and mobility  Chronic pain of right  knee  Repeated falls     Problem List Patient Active Problem List   Diagnosis Date Noted   Hypertension 04/27/2021   Osteoarthritis of left knee 02/25/2021   Knee pain, bilateral 01/15/2021   BMI 40.0-44.9, adult ([place]) 01/15/2021   Sickle cell trait ([place]) 04/08/2016   DUB (dysfunctional uterine bleeding) 04/01/2014   Multiple sclerosis ([place])     Carney Living PT DPT  06/15/2021, 10:48 AM  Davis Gourd  06/15/2021   During this treatment session, the therapist was present, participating in and directing the treatment.    [place], [place], [address] Phone: [phone]   Fax:  [phone]  Name: Meredith Wright MRN: [id number] Date of Birth: 1971-07-13

## 2021-06-23 ENCOUNTER — Telehealth (HOSPITAL_BASED_OUTPATIENT_CLINIC_OR_DEPARTMENT_OTHER): Payer: Self-pay | Admitting: Obstetrics & Gynecology

## 2021-06-23 NOTE — Telephone Encounter (Signed)
Called patient and left message to please call the office back to set up ultrasound appointment .

## 2021-06-28 ENCOUNTER — Other Ambulatory Visit: Payer: Self-pay

## 2021-06-28 ENCOUNTER — Ambulatory Visit (HOSPITAL_BASED_OUTPATIENT_CLINIC_OR_DEPARTMENT_OTHER): Payer: Medicare Other | Admitting: Physical Therapy

## 2021-06-28 ENCOUNTER — Encounter (HOSPITAL_BASED_OUTPATIENT_CLINIC_OR_DEPARTMENT_OTHER): Payer: Self-pay | Admitting: Physical Therapy

## 2021-06-28 DIAGNOSIS — M25562 Pain in left knee: Secondary | ICD-10-CM | POA: Diagnosis not present

## 2021-06-28 DIAGNOSIS — R296 Repeated falls: Secondary | ICD-10-CM

## 2021-06-28 DIAGNOSIS — R2689 Other abnormalities of gait and mobility: Secondary | ICD-10-CM

## 2021-06-28 DIAGNOSIS — G8929 Other chronic pain: Secondary | ICD-10-CM

## 2021-06-29 ENCOUNTER — Encounter (HOSPITAL_BASED_OUTPATIENT_CLINIC_OR_DEPARTMENT_OTHER): Payer: Self-pay | Admitting: Physical Therapy

## 2021-06-29 NOTE — Therapy (Signed)
[facility] [address] [place], [place], [address] Phone: [phone]   Fax:  [phone]  Physical Therapy Treatment  Patient Details  Name: Meredith Wright MRN: [id number] Date of Birth: Jun 01, 1971 Referring Provider (PT): Dr Raymond De [place]   Encounter Date: 06/28/2021   PT End of Session - 06/28/21 1434     Visit Number 7    Number of Visits 12    Date for PT Re-Evaluation 07/26/21    Authorization Type UHC Medicare 2022 [place] Medicaid    PT Start Time 1425    PT Stop Time 1505    PT Time Calculation (min) 40 min    Activity Tolerance Patient tolerated treatment well    Behavior During Therapy [facility] for tasks assessed/performed             Past Medical History:  Diagnosis Date   Multiple sclerosis (HCC)    Sickle cell anemia (HCC)    trait    Past Surgical History:  Procedure Laterality Date   BRAIN SURGERY  11/07/1996   Biopsy -Dx MS   CESAREAN SECTION      There were no vitals filed for this visit.   Subjective Assessment - 06/28/21 1431     Subjective Patient had a dental procedure next week. Overall she is doing well. She has been working on some [employer].    Pertinent History MS (rapid),    Limitations Walking;Lifting;Standing    How long can you stand comfortably? no issues.    How long can you walk comfortably? limited.    Diagnostic tests X ray: mild-moderate OA    Patient Stated Goals decrease pain, walk dog    Currently in Pain? No/denies                               [facility] Adult PT Treatment/Exercise - 06/29/21 0001       Lumbar Exercises: Machines for Strengthening   Other Lumbar Machine Exercise Standing: row 3x10 15 lbs; shoulder extension3x10 15 lbs; pallow press 10lbs 2x10 each direction chops 2x10 each directio ; leg press 3x10 60 lbs; knee extension 3x10 15 lbs;      Lumbar Exercises: Supine   Other Supine Lumbar Exercises SLR 2x10; bridge 3x10;  clam shell x15 blue band            Nu step 5 min L3         PT Education - 06/28/21 1432     Education Details reviewed HEp and symptom management    Person(s) Educated Patient    Methods Explanation;Tactile cues;Verbal cues;Demonstration    Comprehension Verbalized understanding;Returned demonstration;Verbal cues required;Tactile cues required              PT Short Term Goals - 06/29/21 0833       PT SHORT TERM GOAL #1   Title Patient will report decreased pain 2/10.    Baseline 0/10    Time 3    Period Weeks    Status Achieved      PT SHORT TERM GOAL #2   Title Patient will show increased bilateral hip flexion 4+/5.    Baseline 4/5    Time 3    Period Weeks    Status On-going    Target Date 07/05/21               PT Long Term Goals - 06/14/21 1415  PT LONG TERM GOAL #1   Title Patient will show increased strength of bilateral hip throughout 5/5 to improve ambulation for ADLs.    Baseline 4+/5    Time 6    Period Weeks    Status On-going    Target Date 07/26/21      PT LONG TERM GOAL #2   Title Pt will improve gait mechanics to be able to ambulate 1000 feet around community.    Baseline 1414  feet    Time 6    Period Weeks    Status Achieved    Target Date 07/26/21      PT LONG TERM GOAL #3   Title Pt will demostrate thorough understanding of final HEP.    Baseline full understanding of inital HEP    Time 6    Period Weeks    Status On-going    Target Date 07/26/21      PT LONG TERM GOAL #4   Title Patient will improve strength and balance in order to reduce falls risk during ambulation in community.    Baseline no falls since last visit.    Time 6    Period Weeks    Status On-going    Target Date 07/26/21      PT LONG TERM GOAL #5   Title Patient will improve 6 minute walk test distance to 1800 feet in order to ambulate safely around community.    Baseline 1414    Time 3    Period Weeks    Status New    Target Date  07/26/21                   Plan - 06/29/21 0829     Clinical Impression Statement Patient tolerated ther-ex well today. She was able to complete gym exercises without much difficulty. Therapy reviewed proper fit anf use of gy weights. She was advised to find weights she was comfortable with 3x10 to start with then advance and changer her exercises as able. She had no diffuclty with UE or LE exercises.    Personal Factors and Comorbidities Comorbidity 2    Comorbidities Multiple sclerosis rapid, hypertension, osteoathritis    Examination-Activity Limitations Squat;Stairs;Stand;Locomotion Level    Examination-Participation Research scientist (physical sciences);Yard Work    Conservation officer, historic buildings Evolving/Moderate complexity    Clinical Decision Making Moderate    Rehab Potential Good    PT Frequency 1x / week    PT Duration 6 weeks    PT Treatment/Interventions Gait training;Stair training;Cryotherapy;Therapeutic activities;Functional mobility training;Therapeutic exercise;Balance training;Manual techniques;Patient/family education;Ultrasound;Contrast Bath;ADLs/Self Care Home Management;Taping;Energy conservation;Passive range of motion;Joint Manipulations    PT Next Visit Plan continue to progress exercises as tolerated. Review response to gym exercises. Keep weights low and advance reps as tolerated.    PT Home Exercise Plan Quad sets 3x10, seat banded march 3x10, seated banded abduction 3x10, supine green banded abduction 3x10, banded glute raise 3x10, standing marches 3x10, step ups 3x10. Access Code: [email or web address]  URL: [email or web address]  Date: 05/31/2021  Prepared by: Lorayne Bender    Exercises  Clamshell with Resistance - 1 x daily - 7 x weekly - 3 sets - 10 reps  Sidestepping in Squat with Resistance and Arms Forward - 1 x daily - 7 x weekly - 3 sets - 10 reps  Standing March with Counter Support - 1 x daily - 7 x weekly - 3 sets - 10 reps  Hurdles - 1 x  daily - 7 x weekly - 3  sets - 10 reps    Consulted and Agree with Plan of Care Patient             Patient will benefit from skilled therapeutic intervention in order to improve the following deficits and impairments:  Abnormal gait, Decreased endurance, Decreased strength, Pain, Decreased mobility, Impaired perceived functional ability, Improper body mechanics, Postural dysfunction, Decreased balance, Decreased activity tolerance, Obesity  Visit Diagnosis: Chronic pain of left knee  Other abnormalities of gait and mobility  Chronic pain of right knee  Repeated falls     Problem List Patient Active Problem List   Diagnosis Date Noted   Hypertension 04/27/2021   Osteoarthritis of left knee 02/25/2021   Knee pain, bilateral 01/15/2021   BMI 40.0-44.9, adult (HCC) 01/15/2021   Sickle cell trait (HCC) 04/08/2016   DUB (dysfunctional uterine bleeding) 04/01/2014   Multiple sclerosis (HCC)     Dessie Coma PT DPT  06/29/2021, 8:38 AM  [facility] [address] [place], [place], [address] Phone: [phone]   Fax:  [phone]  Name: Meredith Wright MRN: [id number] Date of Birth: 1971-03-27

## 2021-07-05 ENCOUNTER — Other Ambulatory Visit: Payer: Self-pay

## 2021-07-05 ENCOUNTER — Ambulatory Visit (HOSPITAL_BASED_OUTPATIENT_CLINIC_OR_DEPARTMENT_OTHER): Payer: Medicare Other | Admitting: Physical Therapy

## 2021-07-05 ENCOUNTER — Encounter (HOSPITAL_BASED_OUTPATIENT_CLINIC_OR_DEPARTMENT_OTHER): Payer: Self-pay | Admitting: Physical Therapy

## 2021-07-05 DIAGNOSIS — M25561 Pain in right knee: Secondary | ICD-10-CM

## 2021-07-05 DIAGNOSIS — R2689 Other abnormalities of gait and mobility: Secondary | ICD-10-CM

## 2021-07-05 DIAGNOSIS — M25562 Pain in left knee: Secondary | ICD-10-CM | POA: Diagnosis not present

## 2021-07-05 DIAGNOSIS — R296 Repeated falls: Secondary | ICD-10-CM

## 2021-07-05 DIAGNOSIS — G8929 Other chronic pain: Secondary | ICD-10-CM

## 2021-07-06 ENCOUNTER — Encounter (HOSPITAL_BASED_OUTPATIENT_CLINIC_OR_DEPARTMENT_OTHER): Payer: Self-pay | Admitting: Physical Therapy

## 2021-07-06 NOTE — Therapy (Signed)
[facility] [address] [place], [place], [address] Phone: [phone]   Fax:  [phone]  Physical Therapy Treatment  Patient Details  Name: Meredith Wright MRN: [id number] Date of Birth: Jan 26, 1971 Referring Provider (PT): Dr Raymond De [place]   Encounter Date: 07/05/2021   PT End of Session - 07/06/21 0811     Visit Number 8    Number of Visits 12    Date for PT Re-Evaluation 07/26/21    Authorization Type UHC Medicare 2022 [place] Medicaid    PT Start Time 1300    PT Stop Time 1342    PT Time Calculation (min) 42 min    Activity Tolerance Patient tolerated treatment well    Behavior During Therapy [facility] for tasks assessed/performed             Past Medical History:  Diagnosis Date   Multiple sclerosis (HCC)    Sickle cell anemia (HCC)    trait    Past Surgical History:  Procedure Laterality Date   BRAIN SURGERY  11/07/1996   Biopsy -Dx MS   CESAREAN SECTION      There were no vitals filed for this visit.   Subjective Assessment - 07/05/21 1314     Subjective Patient reports her knees have been doing well. She has been to the MD who would like her to try aquatic therapy.    Pertinent History MS (rapid),    Limitations Walking;Lifting;Standing    How long can you stand comfortably? no issues.    How long can you walk comfortably? limited.    Diagnostic tests X ray: mild-moderate OA    Patient Stated Goals decrease pain, walk dog    Currently in Pain? No/denies    Pain Score 2     Pain Location Knee    Pain Orientation Right;Left    Pain Descriptors / Indicators Aching    Pain Type Chronic pain    Pain Onset More than a month ago    Pain Frequency Intermittent    Aggravating Factors  walking, standing    Pain Relieving Factors rest, cool envorionent    Effect of Pain on Daily Activities difficulty walking                               [facility] Adult PT Treatment/Exercise - 07/06/21  0001       Lumbar Exercises: Machines for Strengthening   Other Lumbar Machine Exercise ; leg press 3x10 60 lbs; knee extension 3x10 15 lbs; hip abduction machine 3x10; bicpes curls 3lbs shoulder flexion 2x15 3lbs; shoulder scaption 3x10 3lbs; shoulder press 3x10 20 lbs.                    PT Education - 07/05/21 1318     Education Details reviewed ther-ex.    Person(s) Educated Patient    Methods Explanation;Demonstration;Tactile cues;Verbal cues    Comprehension Verbalized understanding;Returned demonstration;Tactile cues required;Verbal cues required              PT Short Term Goals - 06/29/21 0833       PT SHORT TERM GOAL #1   Title Patient will report decreased pain 2/10.    Baseline 0/10    Time 3    Period Weeks    Status Achieved      PT SHORT TERM GOAL #2   Title Patient will show increased bilateral hip flexion 4+/5.  Baseline 4/5    Time 3    Period Weeks    Status On-going    Target Date 07/05/21               PT Long Term Goals - 06/14/21 1415       PT LONG TERM GOAL #1   Title Patient will show increased strength of bilateral hip throughout 5/5 to improve ambulation for ADLs.    Baseline 4+/5    Time 6    Period Weeks    Status On-going    Target Date 07/26/21      PT LONG TERM GOAL #2   Title Pt will improve gait mechanics to be able to ambulate 1000 feet around community.    Baseline 1414  feet    Time 6    Period Weeks    Status Achieved    Target Date 07/26/21      PT LONG TERM GOAL #3   Title Pt will demostrate thorough understanding of final HEP.    Baseline full understanding of inital HEP    Time 6    Period Weeks    Status On-going    Target Date 07/26/21      PT LONG TERM GOAL #4   Title Patient will improve strength and balance in order to reduce falls risk during ambulation in community.    Baseline no falls since last visit.    Time 6    Period Weeks    Status On-going    Target Date 07/26/21       PT LONG TERM GOAL #5   Title Patient will improve 6 minute walk test distance to 1800 feet in order to ambulate safely around community.    Baseline 1414    Time 3    Period Weeks    Status New    Target Date 07/26/21                   Plan - 07/06/21 8144     Clinical Impression Statement Patient continues to do very well. Therapy reviewed LE gym activity to work on today. She plans on joining the gym. She would also like to try the pool, but she needs to use the cooler pool. She had no pain in her knees today with LE exercises.    Personal Factors and Comorbidities Comorbidity 2    Comorbidities Multiple sclerosis rapid, hypertension, osteoathritis    Examination-Activity Limitations Squat;Stairs;Stand;Locomotion Level    Stability/Clinical Decision Making Evolving/Moderate complexity    Clinical Decision Making Moderate    Rehab Potential Good    PT Frequency 1x / week    PT Duration 6 weeks    PT Treatment/Interventions Gait training;Stair training;Cryotherapy;Therapeutic activities;Functional mobility training;Therapeutic exercise;Balance training;Manual techniques;Patient/family education;Ultrasound;Contrast Bath;ADLs/Self Care Home Management;Taping;Energy conservation;Passive range of motion;Joint Manipulations    PT Next Visit Plan continue to progress exercises as tolerated. Review response to gym exercises. Keep weights low and advance reps as tolerated.    PT Home Exercise Plan Quad sets 3x10, seat banded march 3x10, seated banded abduction 3x10, supine green banded abduction 3x10, banded glute raise 3x10, standing marches 3x10, step ups 3x10. Access Code: [email or web address]  URL: [email or web address]  Date: 05/31/2021  Prepared by: Lorayne Bender    Exercises  Clamshell with Resistance - 1 x daily - 7 x weekly - 3 sets - 10 reps  Sidestepping in Squat with Resistance and Arms Forward - 1 x daily - 7 x weekly - 3 sets -  10 reps  Standing March with Counter Support - 1  x daily - 7 x weekly - 3 sets - 10 reps  Hurdles - 1 x daily - 7 x weekly - 3 sets - 10 reps    Consulted and Agree with Plan of Care Patient             Patient will benefit from skilled therapeutic intervention in order to improve the following deficits and impairments:  Abnormal gait, Decreased endurance, Decreased strength, Pain, Decreased mobility, Impaired perceived functional ability, Improper body mechanics, Postural dysfunction, Decreased balance, Decreased activity tolerance, Obesity  Visit Diagnosis: Chronic pain of left knee  Other abnormalities of gait and mobility  Chronic pain of right knee  Repeated falls     Problem List Patient Active Problem List   Diagnosis Date Noted   Hypertension 04/27/2021   Osteoarthritis of left knee 02/25/2021   Knee pain, bilateral 01/15/2021   BMI 40.0-44.9, adult (HCC) 01/15/2021   Sickle cell trait (HCC) 04/08/2016   DUB (dysfunctional uterine bleeding) 04/01/2014   Multiple sclerosis (HCC)     Dessie Coma PT DPT  07/06/2021, 8:34 AM  [facility] [address] [place], [place], [address] Phone: [phone]   Fax:  [phone]  Name: Meredith Wright MRN: [id number] Date of Birth: 14-Jun-1971

## 2021-07-07 ENCOUNTER — Other Ambulatory Visit (HOSPITAL_BASED_OUTPATIENT_CLINIC_OR_DEPARTMENT_OTHER): Payer: Self-pay | Admitting: Family Medicine

## 2021-07-07 ENCOUNTER — Telehealth (HOSPITAL_BASED_OUTPATIENT_CLINIC_OR_DEPARTMENT_OTHER): Payer: Self-pay | Admitting: Obstetrics & Gynecology

## 2021-07-07 NOTE — Telephone Encounter (Signed)
Called patient and left message to please call the office back to set up ultrasound appointment . 

## 2021-07-13 ENCOUNTER — Encounter (HOSPITAL_BASED_OUTPATIENT_CLINIC_OR_DEPARTMENT_OTHER): Payer: Self-pay | Admitting: Physical Therapy

## 2021-07-13 ENCOUNTER — Other Ambulatory Visit: Payer: Self-pay

## 2021-07-13 ENCOUNTER — Ambulatory Visit (HOSPITAL_BASED_OUTPATIENT_CLINIC_OR_DEPARTMENT_OTHER): Payer: Medicare Other | Attending: Family Medicine | Admitting: Physical Therapy

## 2021-07-13 DIAGNOSIS — G8929 Other chronic pain: Secondary | ICD-10-CM | POA: Insufficient documentation

## 2021-07-13 DIAGNOSIS — R296 Repeated falls: Secondary | ICD-10-CM | POA: Diagnosis present

## 2021-07-13 DIAGNOSIS — M25562 Pain in left knee: Secondary | ICD-10-CM | POA: Diagnosis present

## 2021-07-13 DIAGNOSIS — R2689 Other abnormalities of gait and mobility: Secondary | ICD-10-CM | POA: Insufficient documentation

## 2021-07-13 DIAGNOSIS — M25561 Pain in right knee: Secondary | ICD-10-CM | POA: Diagnosis present

## 2021-07-14 ENCOUNTER — Encounter (HOSPITAL_BASED_OUTPATIENT_CLINIC_OR_DEPARTMENT_OTHER): Payer: Self-pay | Admitting: Physical Therapy

## 2021-07-14 NOTE — Therapy (Signed)
[facility] [address] [place], [place], [address] Phone: [phone]   Fax:  [phone]  Physical Therapy Treatment  Patient Details  Name: Meredith Wright MRN: [id number] Date of Birth: 1971/06/17 Referring Provider (PT): Dr Raymond De [place]   Encounter Date: 07/13/2021   PT End of Session - 07/13/21 1306     Visit Number 9    Number of Visits 12    Date for PT Re-Evaluation 07/26/21    Authorization Type UHC Medicare 2022 [place] Medicaid    PT Start Time 1300    PT Stop Time 1343    PT Time Calculation (min) 43 min    Activity Tolerance Patient tolerated treatment well    Behavior During Therapy [facility] for tasks assessed/performed             Past Medical History:  Diagnosis Date   Multiple sclerosis (HCC)    Sickle cell anemia (HCC)    trait    Past Surgical History:  Procedure Laterality Date   BRAIN SURGERY  11/07/1996   Biopsy -Dx MS   CESAREAN SECTION      There were no vitals filed for this visit.   Subjective Assessment - 07/13/21 1304     Subjective Patient had some popping in her knee. It hasn't hurt too bad. Overall she feels like she is doing OK.    Pertinent History MS (rapid),    Limitations Walking;Lifting;Standing    How long can you stand comfortably? no issues.    How long can you walk comfortably? limited.    Diagnostic tests X ray: mild-moderate OA    Patient Stated Goals decrease pain, walk dog    Currently in Pain? No/denies                               [facility] Adult PT Treatment/Exercise - 07/14/21 0001       Lumbar Exercises: Supine   Clam Limitations 2x15 green    Bridge Limitations 2x15    Other Supine Lumbar Exercises SLR 2x10; uad sets 2x15 bilateral      Knee/Hip Exercises: Stretches   Other Knee/Hip Stretches thomas stretch 3x20 sec hold      Knee/Hip Exercises: Aerobic   Nustep 5 min      Shoulder Exercises: Seated   Other Seated Exercises  bilateral er 2x15; horzontal abduction red 2x10; shoulder flexion red band 2x15;      Shoulder Exercises: Standing   Other Standing Exercises scap retraction and extension with core breathing 2x15 each      Manual Therapy   Manual therapy comments assessed patients knee cap mobility                  Upper Extremity Functional Index Score :   /80   PT Education - 07/13/21 1306     Education Details HEP and symptom mangement    Person(s) Educated Patient    Methods Explanation;Demonstration;Tactile cues;Verbal cues    Comprehension Verbalized understanding;Returned demonstration;Verbal cues required;Tactile cues required              PT Short Term Goals - 06/29/21 0833       PT SHORT TERM GOAL #1   Title Patient will report decreased pain 2/10.    Baseline 0/10    Time 3    Period Weeks    Status Achieved      PT SHORT TERM GOAL #2  Title Patient will show increased bilateral hip flexion 4+/5.    Baseline 4/5    Time 3    Period Weeks    Status On-going    Target Date 07/05/21               PT Long Term Goals - 06/14/21 1415       PT LONG TERM GOAL #1   Title Patient will show increased strength of bilateral hip throughout 5/5 to improve ambulation for ADLs.    Baseline 4+/5    Time 6    Period Weeks    Status On-going    Target Date 07/26/21      PT LONG TERM GOAL #2   Title Pt will improve gait mechanics to be able to ambulate 1000 feet around community.    Baseline 1414  feet    Time 6    Period Weeks    Status Achieved    Target Date 07/26/21      PT LONG TERM GOAL #3   Title Pt will demostrate thorough understanding of final HEP.    Baseline full understanding of inital HEP    Time 6    Period Weeks    Status On-going    Target Date 07/26/21      PT LONG TERM GOAL #4   Title Patient will improve strength and balance in order to reduce falls risk during ambulation in community.    Baseline no falls since last visit.    Time 6     Period Weeks    Status On-going    Target Date 07/26/21      PT LONG TERM GOAL #5   Title Patient will improve 6 minute walk test distance to 1800 feet in order to ambulate safely around community.    Baseline 1414    Time 3    Period Weeks    Status New    Target Date 07/26/21                   Plan - 07/14/21 0806     Clinical Impression Statement Patient tolerated treatment well despite baseline pain. She reports crepitus in her knee. her knee cap was tighter today with palpation. She feels like it is dislocating but it is likley just crepitus. She did have a fall on Saturday. She feels like shegot too hot. The patient fatigued quickly with leg exercises. Therapy switched to upper body and postural exercises. She tolerated those better. She was reported fatigue after her treatment.    Personal Factors and Comorbidities Comorbidity 2    Comorbidities Multiple sclerosis rapid, hypertension, osteoathritis    Examination-Activity Limitations Squat;Stairs;Stand;Locomotion Level    Stability/Clinical Decision Making Evolving/Moderate complexity    Clinical Decision Making Moderate    Rehab Potential Good    PT Frequency 1x / week    PT Duration 6 weeks    PT Treatment/Interventions Gait training;Stair training;Cryotherapy;Therapeutic activities;Functional mobility training;Therapeutic exercise;Balance training;Manual techniques;Patient/family education;Ultrasound;Contrast Bath;ADLs/Self Care Home Management;Taping;Energy conservation;Passive range of motion;Joint Manipulations    PT Next Visit Plan continue to progress exercises as tolerated. Review response to gym exercises. Keep weights low and advance reps as tolerated.    PT Home Exercise Plan Quad sets 3x10, seat banded march 3x10, seated banded abduction 3x10, supine green banded abduction 3x10, banded glute raise 3x10, standing marches 3x10, step ups 3x10. Access Code: [email or web address]  URL: [email or web address]   Date: 05/31/2021  Prepared by: Lorayne Bender    Exercises  Clamshell  with Resistance - 1 x daily - 7 x weekly - 3 sets - 10 reps  Sidestepping in Squat with Resistance and Arms Forward - 1 x daily - 7 x weekly - 3 sets - 10 reps  Standing March with Counter Support - 1 x daily - 7 x weekly - 3 sets - 10 reps  Hurdles - 1 x daily - 7 x weekly - 3 sets - 10 reps    Consulted and Agree with Plan of Care Patient             Patient will benefit from skilled therapeutic intervention in order to improve the following deficits and impairments:  Abnormal gait, Decreased endurance, Decreased strength, Pain, Decreased mobility, Impaired perceived functional ability, Improper body mechanics, Postural dysfunction, Decreased balance, Decreased activity tolerance, Obesity  Visit Diagnosis: Chronic pain of left knee  Other abnormalities of gait and mobility  Chronic pain of right knee  Repeated falls     Problem List Patient Active Problem List   Diagnosis Date Noted   Hypertension 04/27/2021   Osteoarthritis of left knee 02/25/2021   Knee pain, bilateral 01/15/2021   BMI 40.0-44.9, adult (HCC) 01/15/2021   Sickle cell trait (HCC) 04/08/2016   DUB (dysfunctional uterine bleeding) 04/01/2014   Multiple sclerosis (HCC)     Dessie Coma, PT 07/14/2021, 12:19 PM  [facility] [address] [place], [place], [address] Phone: [phone]   Fax:  [phone]  Name: Meredith Wright MRN: [id number] Date of Birth: [birth year]

## 2021-07-19 ENCOUNTER — Encounter (HOSPITAL_BASED_OUTPATIENT_CLINIC_OR_DEPARTMENT_OTHER): Payer: Self-pay | Admitting: Family Medicine

## 2021-07-19 ENCOUNTER — Ambulatory Visit (HOSPITAL_BASED_OUTPATIENT_CLINIC_OR_DEPARTMENT_OTHER): Payer: Medicare Other | Admitting: Physical Therapy

## 2021-07-19 ENCOUNTER — Encounter (HOSPITAL_BASED_OUTPATIENT_CLINIC_OR_DEPARTMENT_OTHER): Payer: Self-pay | Admitting: Physical Therapy

## 2021-07-19 ENCOUNTER — Ambulatory Visit (INDEPENDENT_AMBULATORY_CARE_PROVIDER_SITE_OTHER): Payer: Medicare Other | Admitting: Family Medicine

## 2021-07-19 ENCOUNTER — Other Ambulatory Visit: Payer: Self-pay

## 2021-07-19 VITALS — BP 142/80 | HR 72 | Ht 67.0 in | Wt 298.0 lb

## 2021-07-19 DIAGNOSIS — M1712 Unilateral primary osteoarthritis, left knee: Secondary | ICD-10-CM | POA: Diagnosis not present

## 2021-07-19 DIAGNOSIS — R296 Repeated falls: Secondary | ICD-10-CM

## 2021-07-19 DIAGNOSIS — I10 Essential (primary) hypertension: Secondary | ICD-10-CM

## 2021-07-19 DIAGNOSIS — M25562 Pain in left knee: Secondary | ICD-10-CM | POA: Diagnosis not present

## 2021-07-19 DIAGNOSIS — G8929 Other chronic pain: Secondary | ICD-10-CM

## 2021-07-19 DIAGNOSIS — R2689 Other abnormalities of gait and mobility: Secondary | ICD-10-CM

## 2021-07-19 NOTE — Patient Instructions (Signed)
  Medication Instructions:  Your physician recommends that you continue on your current medications as directed. Please refer to the Current Medication list given to you today. --If you need a refill on any your medications before your next appointment, please call your pharmacy first. If no refills are authorized on file call the office.-- Follow-Up: Your next appointment:   Your physician recommends that you schedule a follow-up appointment in: 3-4 MONTHS with Dr. de [place]  Thanks for letting us be apart of your health journey!!  Primary Care and Sports Medicine   Dr. Raymond de [place]   We encourage you to activate your patient portal called "MyChart".  Sign up information is provided on this After Visit Summary.  MyChart is used to connect with patients for Virtual Visits (Telemedicine).  Patients are able to view lab/test results, encounter notes, upcoming appointments, etc.  Non-urgent messages can be sent to your provider as well. To learn more about what you can do with MyChart, please visit --  [email or web address]    

## 2021-07-19 NOTE — Progress Notes (Signed)
    Procedures performed today:    None.  Independent interpretation of notes and tests performed by another provider:   None.  Brief History, Exam, Impression, and Recommendations:    BP (!) 142/80   Pulse 72   Ht 5\' 7"  (1.702 m)   Wt 298 lb (135.2 kg)   SpO2 98%   BMI 46.67 kg/m   Hypertension Continues to check blood pressure at home, reports that readings range from 120-130s systolic and 70s diastolic Denies any issues with headaches, chest pain, lightheadedness or dizziness Continues to take amlodipine 5 mg Blood pressure in office today is borderline Given home readings, will continue with current dosing  Osteoarthritis of left knee Continues to work with physical therapy, is doing home exercise program Feels that knee pain continues to be much improved from where it was prior to initiating physical therapy Has at least 4 more sessions of PT upcoming, encouraged to continue with this Encouraged to continue with home exercise program Monitor at future office visits  Plan for follow-up in about 3 to 4 months or sooner as needed   ___________________________________________ Deena Shaub de , MD, ABFM, CAQSM Primary Care and Sports Medicine [facility]

## 2021-07-19 NOTE — Assessment & Plan Note (Signed)
Continues to check blood pressure at home, reports that readings range from 120-130s systolic and 70s diastolic Denies any issues with headaches, chest pain, lightheadedness or dizziness Continues to take amlodipine 5 mg Blood pressure in office today is borderline Given home readings, will continue with current dosing

## 2021-07-19 NOTE — Therapy (Signed)
[facility] [address] [place], [place], [address] Phone: [phone]   Fax:  [phone]  Physical Therapy Treatment  Patient Details  Name: Meredith Wright MRN: [id number] Date of Birth: 1971/05/18 Referring Provider (PT): Dr Ceasar Mons [place]  Progress Note Reporting Period 05/12/2021 to 07/20/2021  See note below for Objective Data and Assessment of Progress/Goals.      Encounter Date: 07/19/2021   PT End of Session - 07/19/21 1354     Visit Number 10    Number of Visits 16    Date for PT Re-Evaluation 08/30/21    Authorization Type UHC Medicare 2022 [place] Medicaid  prgoress note perfromed at visit 10    PT Start Time 1315   Patient took about 15 minutes to get ready for the pool   PT Stop Time 1345    PT Time Calculation (min) 30 min    Activity Tolerance Patient tolerated treatment well    Behavior During Therapy WFL for tasks assessed/performed             Past Medical History:  Diagnosis Date   Multiple sclerosis (HCC)    Sickle cell anemia (HCC)    trait    Past Surgical History:  Procedure Laterality Date   BRAIN SURGERY  11/07/1996   Biopsy -Dx MS   CESAREAN SECTION      There were no vitals filed for this visit.   Subjective Assessment - 07/19/21 1352     Subjective Patient reports her knee is feeling better. She is having a better week. She would like to try the pool.    Pertinent History MS (rapid),    Limitations Walking;Lifting;Standing    How long can you stand comfortably? no issues.    How long can you walk comfortably? limited.    Diagnostic tests X ray: mild-moderate OA    Patient Stated Goals decrease pain, walk dog    Currently in Pain? No/denies             Pt seen for aquatic therapy today.  Treatment took place in water 3.25-4 ft in depth at the [facility] pool. Temp of water was 86 ( patient kept in cool pool) .  Pt entered/exited the pool via stairs (step through  pattern) independently with bilat rail.  Introduction to water. Had patient stand at different levels so she could feel the bouncy   Warm up: regular walking 2 laps with noodle for balance; long strides 2x20 '; standing march walk 2x20'  Ambulation with noodle support: long strides, march   Exercises: Slow march x20; squats x20; hip extension x20; hip abduction x20; Sit to stand x20;    board trunk flexion x10 lateral board rotation x10 each way seated   Steps step up x20 each leg; lateral step up x20 each leg;     Noodle push and pull x20 with core breathing; noodle press x20 with core breathing     Pt requires buoyancy for support and to offload joints with strengthening exercises. Viscosity of the water is needed for resistance of strengthening; water current perturbations provides challenge to standing balance unsupported, requiring increased core activation.   [facility] PT Assessment - 07/19/21 0001       Assessment   Medical Diagnosis Knee OA    Referring Provider (PT) Dr Ceasar Mons Peru      Strength   Right Hip Flexion 4+/5    Right Hip ABduction 4+/5    Right Hip ADduction  5/5    Left Hip Flexion 4+/5    Left Hip ABduction 5/5    Left Hip ADduction 5/5    Right Knee Flexion 5/5    Right Knee Extension 5/5    Left Knee Flexion 5/5    Left Knee Extension 5/5      Palpation   Palpation comment improved tenderness to palpation      6 minute walk test results    Aerobic Endurance Distance Walked 1430   from 8/4                                   PT Education - 07/19/21 1353     Education Details reviewed HEP and symptom management    Person(s) Educated Patient    Methods Explanation;Demonstration;Tactile cues;Verbal cues    Comprehension Verbalized understanding;Returned demonstration;Verbal cues required;Tactile cues required              PT Short Term Goals - 06/29/21 0833       PT SHORT TERM GOAL #1   Title Patient will  report decreased pain 2/10.    Baseline 0/10    Time 3    Period Weeks    Status Achieved      PT SHORT TERM GOAL #2   Title Patient will show increased bilateral hip flexion 4+/5.    Baseline 4/5    Time 3    Period Weeks    Status On-going    Target Date 07/05/21               PT Long Term Goals - 06/14/21 1415       PT LONG TERM GOAL #1   Title Patient will show increased strength of bilateral hip throughout 5/5 to improve ambulation for ADLs.    Baseline 4+/5    Time 6    Period Weeks    Status On-going    Target Date 07/26/21      PT LONG TERM GOAL #2   Title Pt will improve gait mechanics to be able to ambulate 1000 feet around community.    Baseline 1414  feet    Time 6    Period Weeks    Status Achieved    Target Date 07/26/21      PT LONG TERM GOAL #3   Title Pt will demostrate thorough understanding of final HEP.    Baseline full understanding of inital HEP    Time 6    Period Weeks    Status On-going    Target Date 07/26/21      PT LONG TERM GOAL #4   Title Patient will improve strength and balance in order to reduce falls risk during ambulation in community.    Baseline no falls since last visit.    Time 6    Period Weeks    Status On-going    Target Date 07/26/21      PT LONG TERM GOAL #5   Title Patient will improve 6 minute walk test distance to 1800 feet in order to ambulate safely around community.    Baseline 1414    Time 3    Period Weeks    Status New    Target Date 07/26/21                   Plan - 07/19/21 1355     Clinical Impression Statement Patient is making good progress.  Last visit she had increased pain but had much less pain this viasit. Therapy assessed patient on land then she moved to water to review water exercises. overall the patient is making great progress. She is back to exercising with only intemittent. She has improved strength. She would benefit from continued skilled therapy 1W4 for continued HEP  in the pool and further review of gym activity.    Personal Factors and Comorbidities Comorbidity 2    Comorbidities Multiple sclerosis rapid, hypertension, osteoathritis    Examination-Activity Limitations Squat;Stairs;Stand;Locomotion Level    Examination-Participation Research scientist (physical sciences);Yard Work    Conservation officer, historic buildings Evolving/Moderate complexity    Clinical Decision Making Moderate    Rehab Potential Good    PT Frequency 1x / week    PT Duration 6 weeks    PT Treatment/Interventions Gait training;Stair training;Cryotherapy;Therapeutic activities;Functional mobility training;Therapeutic exercise;Balance training;Manual techniques;Patient/family education;Ultrasound;Contrast Bath;ADLs/Self Care Home Management;Taping;Energy conservation;Passive range of motion;Joint Manipulations    PT Next Visit Plan continue to progress exercises as tolerated. Review response to gym exercises. Keep weights low and advance reps as tolerated.    PT Home Exercise Plan Quad sets 3x10, seat banded march 3x10, seated banded abduction 3x10, supine green banded abduction 3x10, banded glute raise 3x10, standing marches 3x10, step ups 3x10. Access Code: [email or web address]  URL: [email or web address]  Date: 05/31/2021  Prepared by: Meredith Wright    Exercises  Clamshell with Resistance - 1 x daily - 7 x weekly - 3 sets - 10 reps  Sidestepping in Squat with Resistance and Arms Forward - 1 x daily - 7 x weekly - 3 sets - 10 reps  Standing March with Counter Support - 1 x daily - 7 x weekly - 3 sets - 10 reps  Hurdles - 1 x daily - 7 x weekly - 3 sets - 10 reps    Consulted and Agree with Plan of Care Patient             Patient will benefit from skilled therapeutic intervention in order to improve the following deficits and impairments:  Abnormal gait, Decreased endurance, Decreased strength, Pain, Decreased mobility, Impaired perceived functional ability, Improper body  mechanics, Postural dysfunction, Decreased balance, Decreased activity tolerance, Obesity  Visit Diagnosis: No diagnosis found.     Problem List Patient Active Problem List   Diagnosis Date Noted   Hypertension 04/27/2021   Osteoarthritis of left knee 02/25/2021   Knee pain, bilateral 01/15/2021   BMI 40.0-44.9, adult (HCC) 01/15/2021   Sickle cell trait (HCC) 04/08/2016   DUB (dysfunctional uterine bleeding) 04/01/2014   Multiple sclerosis (HCC)     Dessie Coma, PT DPT  07/19/2021, 3:21 PM  [facility] [address] [place], [place], [address] Phone: [phone]   Fax:  [phone]  Name: Meredith Wright MRN: [id number] Date of Birth: 03/05/1971

## 2021-07-19 NOTE — Assessment & Plan Note (Signed)
Continues to work with physical therapy, is doing home exercise program Feels that knee pain continues to be much improved from where it was prior to initiating physical therapy Has at least 4 more sessions of PT upcoming, encouraged to continue with this Encouraged to continue with home exercise program Monitor at future office visits

## 2021-07-20 ENCOUNTER — Encounter (HOSPITAL_BASED_OUTPATIENT_CLINIC_OR_DEPARTMENT_OTHER): Payer: Self-pay | Admitting: Physical Therapy

## 2021-07-20 NOTE — Addendum Note (Signed)
Addended by: Dessie Coma on: 07/20/2021 12:23 PM   Modules accepted: Orders

## 2021-07-27 ENCOUNTER — Other Ambulatory Visit: Payer: Self-pay

## 2021-07-27 ENCOUNTER — Ambulatory Visit (HOSPITAL_BASED_OUTPATIENT_CLINIC_OR_DEPARTMENT_OTHER): Payer: Medicare Other | Admitting: Physical Therapy

## 2021-07-27 ENCOUNTER — Encounter (HOSPITAL_BASED_OUTPATIENT_CLINIC_OR_DEPARTMENT_OTHER): Payer: Self-pay | Admitting: Physical Therapy

## 2021-07-27 DIAGNOSIS — M25562 Pain in left knee: Secondary | ICD-10-CM

## 2021-07-27 DIAGNOSIS — R2689 Other abnormalities of gait and mobility: Secondary | ICD-10-CM

## 2021-07-27 DIAGNOSIS — R296 Repeated falls: Secondary | ICD-10-CM

## 2021-07-27 DIAGNOSIS — M25561 Pain in right knee: Secondary | ICD-10-CM

## 2021-07-27 DIAGNOSIS — G8929 Other chronic pain: Secondary | ICD-10-CM

## 2021-07-27 NOTE — Therapy (Signed)
[facility] [address] [place], [place], [address] Phone: [phone]   Fax:  [phone]  Physical Therapy Treatment  Patient Details  Name: Meredith Wright MRN: [id number] Date of Birth: 03/07/1971 Referring Provider (PT): Dr Raymond De [place]   Encounter Date: 07/27/2021   PT End of Session - 07/27/21 2140     Visit Number 11    Number of Visits 16    Date for PT Re-Evaluation 08/30/21    PT Start Time 1030    PT Stop Time 1100    PT Time Calculation (min) 30 min    Activity Tolerance Patient tolerated treatment well    Behavior During Therapy [facility] for tasks assessed/performed             Past Medical History:  Diagnosis Date   Multiple sclerosis (HCC)    Sickle cell anemia (HCC)    trait    Past Surgical History:  Procedure Laterality Date   BRAIN SURGERY  11/07/1996   Biopsy -Dx MS   CESAREAN SECTION      There were no vitals filed for this visit.   Subjective Assessment - 07/27/21 2137     Subjective Patient reports her knees have been doing well. She is hoping to join the gym soon.    Pertinent History MS (rapid),    Limitations Walking;Lifting;Standing    How long can you stand comfortably? no issues.    How long can you walk comfortably? limited.    Diagnostic tests X ray: mild-moderate OA    Currently in Pain? No/denies                   Pt seen for aquatic therapy today.  Treatment took place in water 3.25-4 ft in depth at the [facility] pool. Temp of water was 86 ( patient kept in cool pool) .  Pt entered/exited the pool via stairs (step through pattern) independently with bilat rail.   Introduction to water. Had patient stand at different levels so she could feel the bouncy    Warm up: regular walking 2 laps with noodle for balance; long strides 2x20 '; standing march walk 2x20'  Ambulation with noodle support: long strides, march    Exercises: Slow march x20; squats  x20; hip extension x20; hip abduction x20; Sit to stand x20;     board trunk flexion x10 lateral board rotation x10 each way seated    Steps step up x20 each leg; lateral step up x20 each leg;     bilateral weight push x20     Noodle push and pull x20 with core breathing; noodle press x20 with core breathing        Pt requires buoyancy for support and to offload joints with strengthening exercises. Viscosity of the water is needed for resistance of strengthening; water current perturbations provides challenge to standing balance unsupported, requiring increased core activation.                     PT Education - 07/27/21 2138     Education Details reviewd pool exercises    Person(s) Educated Patient    Methods Explanation;Demonstration;Tactile cues;Verbal cues    Comprehension Verbalized understanding;Returned demonstration;Verbal cues required;Tactile cues required              PT Short Term Goals - 06/29/21 0833       PT SHORT TERM GOAL #1   Title Patient will report decreased pain 2/10.  Baseline 0/10    Time 3    Period Weeks    Status Achieved      PT SHORT TERM GOAL #2   Title Patient will show increased bilateral hip flexion 4+/5.    Baseline 4/5    Time 3    Period Weeks    Status On-going    Target Date 07/05/21               PT Long Term Goals - 06/14/21 1415       PT LONG TERM GOAL #1   Title Patient will show increased strength of bilateral hip throughout 5/5 to improve ambulation for ADLs.    Baseline 4+/5    Time 6    Period Weeks    Status On-going    Target Date 07/26/21      PT LONG TERM GOAL #2   Title Pt will improve gait mechanics to be able to ambulate 1000 feet around community.    Baseline 1414  feet    Time 6    Period Weeks    Status Achieved    Target Date 07/26/21      PT LONG TERM GOAL #3   Title Pt will demostrate thorough understanding of final HEP.    Baseline full understanding of inital HEP     Time 6    Period Weeks    Status On-going    Target Date 07/26/21      PT LONG TERM GOAL #4   Title Patient will improve strength and balance in order to reduce falls risk during ambulation in community.    Baseline no falls since last visit.    Time 6    Period Weeks    Status On-going    Target Date 07/26/21      PT LONG TERM GOAL #5   Title Patient will improve 6 minute walk test distance to 1800 feet in order to ambulate safely around community.    Baseline 1414    Time 3    Period Weeks    Status New    Target Date 07/26/21                   Plan - 07/27/21 2143     Clinical Impression Statement Patient was limited by time today. Therapy reviewd exercises in the pool that she can perfrom. She alsowas able to see the water aerobics class which would be good for her to work on. Therapy reviewed techniques with squats. Therapy will continue to progess as tolerated.    Personal Factors and Comorbidities Comorbidity 2    Comorbidities Multiple sclerosis rapid, hypertension, osteoathritis    Examination-Activity Limitations Squat;Stairs;Stand;Locomotion Level    Examination-Participation Research scientist (physical sciences);Yard Work    Conservation officer, historic buildings Evolving/Moderate complexity    Clinical Decision Making Moderate    Rehab Potential Good    PT Frequency 1x / week    PT Duration 6 weeks    PT Treatment/Interventions Gait training;Stair training;Cryotherapy;Therapeutic activities;Functional mobility training;Therapeutic exercise;Balance training;Manual techniques;Patient/family education;Ultrasound;Contrast Bath;ADLs/Self Care Home Management;Taping;Energy conservation;Passive range of motion;Joint Manipulations    PT Next Visit Plan continue to progress exercises as tolerated. Review response to gym exercises. Keep weights low and advance reps as tolerated.    PT Home Exercise Plan Quad sets 3x10, seat banded march 3x10, seated banded  abduction 3x10, supine green banded abduction 3x10, banded glute raise 3x10, standing marches 3x10, step ups 3x10. Access Code: [email or web address]  URL: [email or web address]  Date: 05/31/2021  Prepared by: Lorayne Bender    Exercises  Clamshell with Resistance - 1 x daily - 7 x weekly - 3 sets - 10 reps  Sidestepping in Squat with Resistance and Arms Forward - 1 x daily - 7 x weekly - 3 sets - 10 reps  Standing March with Counter Support - 1 x daily - 7 x weekly - 3 sets - 10 reps  Hurdles - 1 x daily - 7 x weekly - 3 sets - 10 reps    Consulted and Agree with Plan of Care Patient             Patient will benefit from skilled therapeutic intervention in order to improve the following deficits and impairments:  Abnormal gait, Decreased endurance, Decreased strength, Pain, Decreased mobility, Impaired perceived functional ability, Improper body mechanics, Postural dysfunction, Decreased balance, Decreased activity tolerance, Obesity  Visit Diagnosis: Chronic pain of left knee  Other abnormalities of gait and mobility  Chronic pain of right knee  Repeated falls     Problem List Patient Active Problem List   Diagnosis Date Noted   Hypertension 04/27/2021   Osteoarthritis of left knee 02/25/2021   Knee pain, bilateral 01/15/2021   BMI 40.0-44.9, adult (HCC) 01/15/2021   Sickle cell trait (HCC) 04/08/2016   DUB (dysfunctional uterine bleeding) 04/01/2014   Multiple sclerosis (HCC)     Dessie Coma, PT 07/27/2021, 9:48 PM  [facility] [address] [place], [place], [address] Phone: [phone]   Fax:  [phone]  Name: Meredith Wright MRN: [id number] Date of Birth: 06-Dec-1970

## 2021-08-03 ENCOUNTER — Other Ambulatory Visit: Payer: Self-pay

## 2021-08-03 ENCOUNTER — Encounter (HOSPITAL_BASED_OUTPATIENT_CLINIC_OR_DEPARTMENT_OTHER): Payer: Self-pay | Admitting: Physical Therapy

## 2021-08-03 ENCOUNTER — Ambulatory Visit (HOSPITAL_BASED_OUTPATIENT_CLINIC_OR_DEPARTMENT_OTHER): Payer: Medicare Other | Admitting: Physical Therapy

## 2021-08-03 DIAGNOSIS — M25562 Pain in left knee: Secondary | ICD-10-CM

## 2021-08-03 DIAGNOSIS — R2689 Other abnormalities of gait and mobility: Secondary | ICD-10-CM

## 2021-08-03 DIAGNOSIS — M25561 Pain in right knee: Secondary | ICD-10-CM

## 2021-08-03 DIAGNOSIS — R296 Repeated falls: Secondary | ICD-10-CM

## 2021-08-03 DIAGNOSIS — G8929 Other chronic pain: Secondary | ICD-10-CM

## 2021-08-04 ENCOUNTER — Encounter (HOSPITAL_BASED_OUTPATIENT_CLINIC_OR_DEPARTMENT_OTHER): Payer: Self-pay | Admitting: Physical Therapy

## 2021-08-04 NOTE — Therapy (Signed)
[facility] [address] [place], [place], [address] Phone: [phone]   Fax:  [phone]  Physical Therapy Treatment  Patient Details  Name: Meredith Wright MRN: [id number] Date of Birth: 1971-10-25 Referring Provider (PT): Dr Raymond De [place]   Encounter Date: 08/03/2021   PT End of Session - 08/04/21 1533     Visit Number 12    Number of Visits 16    Date for PT Re-Evaluation 08/30/21    Authorization Type UHC Medicare 2022 [place] Medicaid  prgoress note perfromed at visit 10    PT Start Time 1015    PT Stop Time 1057    PT Time Calculation (min) 42 min    Activity Tolerance Patient tolerated treatment well    Behavior During Therapy [facility] for tasks assessed/performed             Past Medical History:  Diagnosis Date   Multiple sclerosis (HCC)    Sickle cell anemia (HCC)    trait    Past Surgical History:  Procedure Laterality Date   BRAIN SURGERY  11/07/1996   Biopsy -Dx MS   CESAREAN SECTION      There were no vitals filed for this visit.   Subjective Assessment - 08/03/21 1051     Subjective Patient reports her left knee has been a little sore today but not too bad. She has joined the gym She has been coming to the gym and working [employer] with the trainers.    How long can you stand comfortably? no issues.    How long can you walk comfortably? limited.    Diagnostic tests X ray: mild-moderate OA    Patient Stated Goals decrease pain, walk dog    Currently in Pain? No/denies                [facility] PT Assessment - 08/04/21 0001       Assessment   Medical Diagnosis Knee OA    Referring Provider (PT) Dr Raymond De [place]                           [facility] Adult PT Treatment/Exercise - 08/04/21 0001       Lumbar Exercises: Machines for Strengthening   Other Lumbar Machine Exercise ; leg press 3x10 60 lbs; knee extension 3x10 15 lbs; hip abduction machine 3x10; bicpes curls 3lbs  shoulder flexion 2x15 Pallof press 2x10 5 lbs;                     PT Education - 08/04/21 1533     Education Details HEP and symptom management    Person(s) Educated Patient    Methods Explanation;Demonstration;Tactile cues;Verbal cues    Comprehension Verbalized understanding;Returned demonstration;Verbal cues required;Tactile cues required              PT Short Term Goals - 08/04/21 1540       PT SHORT TERM GOAL #1   Title Patient will report decreased pain 2/10.    Baseline 0/10    Time 3    Period Weeks    Status Achieved    Target Date 06/01/21      PT SHORT TERM GOAL #2   Title Patient will show increased bilateral hip flexion 4+/5.    Baseline 5/5    Time 3    Period Weeks    Status Achieved    Target Date 07/05/21  PT Long Term Goals - 08/04/21 1540       PT LONG TERM GOAL #1   Title Patient will show increased strength of bilateral hip throughout 5/5 to improve ambulation for ADLs.    Baseline 4+/5    Time 6    Period Weeks    Status Achieved      PT LONG TERM GOAL #2   Title Pt will improve gait mechanics to be able to ambulate 1000 feet around community.    Baseline 1414  feet from rpevious visit    Time 6    Period Weeks    Status Achieved      PT LONG TERM GOAL #3   Title Pt will demostrate thorough understanding of final HEP.    Baseline full understanding of inital HEP    Time 6    Period Weeks    Status Achieved      PT LONG TERM GOAL #4   Title Patient will improve strength and balance in order to reduce falls risk during ambulation in community.    Baseline no falls since last visit.    Time 6    Period Weeks    Status Achieved      PT LONG TERM GOAL #5   Title Patient will improve 6 minute walk test distance to 1800 feet in order to ambulate safely around community.    Time 3    Period Weeks    Status On-going                   Plan - 08/04/21 1535     Clinical Impression Statement  Patient done well with therapy. She has been working independently at [employer] and with the trainers. Her knees have doen well. She has pain every once and wawhile. Today therapy went through her exercises with her but they were all things she is doing on her own. She has no need fro further skilled therapy. Her last 2 visits were canceled and she will be discharged to HEP. She has a full [employer] as well. She had no pain with ehr gym exercises today.    Personal Factors and Comorbidities Comorbidity 2    Comorbidities Multiple sclerosis rapid, hypertension, osteoathritis    Examination-Activity Limitations Squat;Stairs;Stand;Locomotion Level    Examination-Participation Research scientist (physical sciences);Yard Work    Conservation officer, historic buildings Evolving/Moderate complexity    Clinical Decision Making Moderate    Rehab Potential Good    PT Frequency 1x / week    PT Duration 6 weeks    PT Treatment/Interventions Gait training;Stair training;Cryotherapy;Therapeutic activities;Functional mobility training;Therapeutic exercise;Balance training;Manual techniques;Patient/family education;Ultrasound;Contrast Bath;ADLs/Self Care Home Management;Taping;Energy conservation;Passive range of motion;Joint Manipulations    PT Next Visit Plan continue to progress exercises as tolerated. Review response to gym exercises. Keep weights low and advance reps as tolerated.    PT Home Exercise Plan Quad sets 3x10, seat banded march 3x10, seated banded abduction 3x10, supine green banded abduction 3x10, banded glute raise 3x10, standing marches 3x10, step ups 3x10. Access Code: [email or web address]  URL: [email or web address]  Date: 05/31/2021  Prepared by: Lorayne Bender    Exercises  Clamshell with Resistance - 1 x daily - 7 x weekly - 3 sets - 10 reps  Sidestepping in Squat with Resistance and Arms Forward - 1 x daily - 7 x weekly - 3 sets - 10 reps  Standing March with Counter Support - 1 x daily - 7  x weekly - 3 sets - 10 reps  Hurdles - 1 x daily - 7 x weekly - 3 sets - 10 reps    Consulted and Agree with Plan of Care Patient             Patient will benefit from skilled therapeutic intervention in order to improve the following deficits and impairments:  Abnormal gait, Decreased endurance, Decreased strength, Pain, Decreased mobility, Impaired perceived functional ability, Improper body mechanics, Postural dysfunction, Decreased balance, Decreased activity tolerance, Obesity  Visit Diagnosis: Chronic pain of left knee  Other abnormalities of gait and mobility  Chronic pain of right knee  Repeated falls     Problem List Patient Active Problem List   Diagnosis Date Noted   Hypertension 04/27/2021   Osteoarthritis of left knee 02/25/2021   Knee pain, bilateral 01/15/2021   BMI 40.0-44.9, adult (HCC) 01/15/2021   Sickle cell trait (HCC) 04/08/2016   DUB (dysfunctional uterine bleeding) 04/01/2014   Multiple sclerosis (HCC)     Dessie Coma, PT 08/04/2021, 3:43 PM  [facility] [address] [place], [place], [address] Phone: [phone]   Fax:  [phone]  Name: Meredith Wright MRN: [id number] Date of Birth: [birth year]

## 2021-08-09 ENCOUNTER — Encounter (HOSPITAL_BASED_OUTPATIENT_CLINIC_OR_DEPARTMENT_OTHER): Payer: Medicare Other | Admitting: Physical Therapy

## 2021-08-11 ENCOUNTER — Other Ambulatory Visit (HOSPITAL_BASED_OUTPATIENT_CLINIC_OR_DEPARTMENT_OTHER): Payer: Self-pay | Admitting: Family Medicine

## 2021-08-11 DIAGNOSIS — I10 Essential (primary) hypertension: Secondary | ICD-10-CM

## 2021-08-16 ENCOUNTER — Encounter (HOSPITAL_BASED_OUTPATIENT_CLINIC_OR_DEPARTMENT_OTHER): Payer: Medicare Other | Admitting: Physical Therapy

## 2021-09-02 ENCOUNTER — Other Ambulatory Visit (HOSPITAL_BASED_OUTPATIENT_CLINIC_OR_DEPARTMENT_OTHER): Payer: Self-pay | Admitting: Family Medicine

## 2021-10-18 ENCOUNTER — Ambulatory Visit (HOSPITAL_BASED_OUTPATIENT_CLINIC_OR_DEPARTMENT_OTHER): Payer: Medicare Other | Admitting: Family Medicine

## 2021-10-25 ENCOUNTER — Other Ambulatory Visit (HOSPITAL_BASED_OUTPATIENT_CLINIC_OR_DEPARTMENT_OTHER): Payer: Self-pay | Admitting: Family Medicine

## 2021-11-11 ENCOUNTER — Other Ambulatory Visit (HOSPITAL_BASED_OUTPATIENT_CLINIC_OR_DEPARTMENT_OTHER): Payer: Self-pay | Admitting: Family Medicine

## 2021-11-11 DIAGNOSIS — I10 Essential (primary) hypertension: Secondary | ICD-10-CM

## 2021-11-30 ENCOUNTER — Encounter: Payer: Self-pay | Admitting: Obstetrics & Gynecology

## 2021-12-27 ENCOUNTER — Other Ambulatory Visit: Payer: Self-pay

## 2021-12-27 ENCOUNTER — Encounter (HOSPITAL_BASED_OUTPATIENT_CLINIC_OR_DEPARTMENT_OTHER): Payer: Self-pay | Admitting: Family Medicine

## 2021-12-27 ENCOUNTER — Ambulatory Visit (INDEPENDENT_AMBULATORY_CARE_PROVIDER_SITE_OTHER): Payer: Medicare Other | Admitting: Family Medicine

## 2021-12-27 DIAGNOSIS — M65312 Trigger thumb, left thumb: Secondary | ICD-10-CM | POA: Diagnosis not present

## 2021-12-27 DIAGNOSIS — M79642 Pain in left hand: Secondary | ICD-10-CM | POA: Diagnosis not present

## 2021-12-27 DIAGNOSIS — M79641 Pain in right hand: Secondary | ICD-10-CM | POA: Diagnosis not present

## 2021-12-27 NOTE — Progress Notes (Signed)
° ° °  Procedures performed today:    None.  Independent interpretation of notes and tests performed by another provider:   None.  Brief History, Exam, Impression, and Recommendations:    BP 117/72    Pulse 96    Ht 5\' 8"  (1.727 m)    Wt (!) 310 lb (140.6 kg)    SpO2 96%    BMI 47.14 kg/m   Trigger thumb of left hand Patient reports bilateral hand pain, primarily located over thumbs.  She reports that she will wake up sometimes at night and her thumb will be stuck in the flexed position and she will need to use her other hand to help straighten it.  Left thumb is more symptomatic than the right thumb.  Denies any prior similar issues pertaining to the locking and pain.  Indicates that pain is primarily in the region of the MCP and IP joint.  When it does lock, pain is mostly over palmar aspect of MCP joint.  She does not have any associated numbness or tingling.  No weakness appreciated. On exam, she does have some reproducible locking of the thumbs bilaterally.  Some tenderness to palpation along palmar aspect of MCP joints bilaterally.  Neurovascular exam is intact. Suspect that current symptoms are related to trigger thumb with left more symptomatic than right.  Discussed likely etiology with patient and treatment options including doing nothing, ultrasound-guided injection, surgical intervention.  Patient would prefer to avoid surgical intervention. We will proceed with initial x-rays of bilateral hands If x-rays are reassuring, likely can proceed with ultrasound-guided trigger thumb injection on the left to be scheduled at patient's earliest convenience once imaging is reviewed Did discuss that a second injection is sometimes needed.  Also discussed that it is possible that no significant relief is obtained from injections and surgical consideration would be next step We will plan for follow-up as needed.  We will contact patient once imaging results  obtained   ___________________________________________ Meredith Potier de [place], MD, ABFM, [facility] and [place]

## 2021-12-27 NOTE — Patient Instructions (Signed)
°  Medication Instructions:  Your physician recommends that you continue on your current medications as directed. Please refer to the Current Medication list given to you today. --If you need a refill on any your medications before your next appointment, please call your pharmacy first. If no refills are authorized on file call the office.--   Referrals/Procedures/Imaging: Bilateral hand xray  Follow-Up: Your next appointment:   Your physician recommends that you schedule a follow-up appointment in:PRN with Dr. de [place]  You will receive a text message or e-mail with a link to a survey about your care and experience with [place] today! We would greatly appreciate your feedback!   Thanks for letting us be apart of your health journey!!  Primary Care and Sports Medicine   Dr. Ceasar Mons Peru   We encourage you to activate your patient portal called "MyChart".  Sign up information is provided on this After Visit Summary.  MyChart is used to connect with patients for Virtual Visits (Telemedicine).  Patients are able to view lab/test results, encounter notes, upcoming appointments, etc.  Non-urgent messages can be sent to your provider as well. To learn more about what you can do with MyChart, please visit --  [email or web address].

## 2021-12-27 NOTE — Assessment & Plan Note (Signed)
Patient reports bilateral hand pain, primarily located over thumbs.  She reports that she will wake up sometimes at night and her thumb will be stuck in the flexed position and she will need to use her other hand to help straighten it.  Left thumb is more symptomatic than the right thumb.  Denies any prior similar issues pertaining to the locking and pain.  Indicates that pain is primarily in the region of the MCP and IP joint.  When it does lock, pain is mostly over palmar aspect of MCP joint.  She does not have any associated numbness or tingling.  No weakness appreciated. On exam, she does have some reproducible locking of the thumbs bilaterally.  Some tenderness to palpation along palmar aspect of MCP joints bilaterally.  Neurovascular exam is intact. Suspect that current symptoms are related to trigger thumb with left more symptomatic than right.  Discussed likely etiology with patient and treatment options including doing nothing, ultrasound-guided injection, surgical intervention.  Patient would prefer to avoid surgical intervention. We will proceed with initial x-rays of bilateral hands If x-rays are reassuring, likely can proceed with ultrasound-guided trigger thumb injection on the left to be scheduled at patient's earliest convenience once imaging is reviewed Did discuss that a second injection is sometimes needed.  Also discussed that it is possible that no significant relief is obtained from injections and surgical consideration would be next step We will plan for follow-up as needed.  We will contact patient once imaging results obtained

## 2021-12-28 ENCOUNTER — Other Ambulatory Visit: Payer: Self-pay | Admitting: Obstetrics & Gynecology

## 2021-12-28 ENCOUNTER — Ambulatory Visit
Admission: RE | Admit: 2021-12-28 | Discharge: 2021-12-28 | Disposition: A | Discharge status: Home / Self Care | Payer: Medicare Other | Source: Ambulatory Visit | Attending: Obstetrics & Gynecology | Admitting: Obstetrics & Gynecology

## 2021-12-28 ENCOUNTER — Ambulatory Visit
Admission: RE | Admit: 2021-12-28 | Discharge: 2021-12-28 | Disposition: A | Discharge status: Home / Self Care | Payer: Medicare Other | Source: Ambulatory Visit | Attending: Family Medicine | Admitting: Family Medicine

## 2021-12-28 DIAGNOSIS — Z1231 Encounter for screening mammogram for malignant neoplasm of breast: Secondary | ICD-10-CM

## 2021-12-28 DIAGNOSIS — M79641 Pain in right hand: Secondary | ICD-10-CM

## 2021-12-28 DIAGNOSIS — M79642 Pain in left hand: Secondary | ICD-10-CM

## 2022-01-10 ENCOUNTER — Ambulatory Visit (INDEPENDENT_AMBULATORY_CARE_PROVIDER_SITE_OTHER): Payer: Medicare Other

## 2022-01-10 ENCOUNTER — Other Ambulatory Visit: Payer: Self-pay

## 2022-01-10 ENCOUNTER — Encounter (HOSPITAL_BASED_OUTPATIENT_CLINIC_OR_DEPARTMENT_OTHER): Payer: Self-pay | Admitting: Family Medicine

## 2022-01-10 ENCOUNTER — Ambulatory Visit (INDEPENDENT_AMBULATORY_CARE_PROVIDER_SITE_OTHER): Payer: Medicare Other | Admitting: Family Medicine

## 2022-01-10 VITALS — BP 122/82 | HR 96 | Ht 72.0 in | Wt 310.3 lb

## 2022-01-10 DIAGNOSIS — M65312 Trigger thumb, left thumb: Secondary | ICD-10-CM

## 2022-01-10 NOTE — Progress Notes (Signed)
? ? ?  Procedures performed today:   ? ?Procedure: Real-time Ultrasound Guided injection of left trigger thumb ?Device: Samsung HS60  ?Verbal informed consent obtained.  ?Time-out conducted.  ?Noted no overlying erythema, induration, or other signs of local infection.  ?Skin prepped in a sterile fashion.  ?Local anesthesia: None.  ?With sterile technique and under real time ultrasound guidance: 0.5 cc Kenalog 40 mg per mL, 0.5 cc lidocaine 1% injected easily into flexor tendon sheath near the A1 pulley ?Completed without difficulty  ?Advised to call if fevers/chills, erythema, induration, drainage, or persistent bleeding.  ?Images permanently stored and available for review in PACS.  ?Impression: Technically successful ultrasound guided injection. ? ?Independent interpretation of notes and tests performed by another provider:  ? ?None. ? ?Brief History, Exam, Impression, and Recommendations:   ? ?BP 122/82   Pulse 96   Ht 6' (1.829 m)   Wt (!) 310 lb 4.8 oz (140.8 kg)   SpO2 (!) 72%   BMI 42.08 kg/m?  ? ?Trigger thumb of left hand ?Patient presents for trigger thumb injection of left hand.  Patient was seen in the office and evaluated previously with discussion of possible ultrasound-guided injection to address symptoms.  X-rays completed previously, reviewed, discussed with patient ?She is interested in proceeding with trigger thumb injection, discussed precautions previously and reviewed again today. ?Procedure note as above ? ?Patient with ongoing left knee pain, has worked with PT in the past, prior imaging revealed tricompartmental osteoarthritis of left knee.  She is interested in steroid injection to left knee.  We will arrange for close follow-up later this week or next week to complete left knee injection at procedure visit ? ? ?___________________________________________ ?Yousif Edelson de [place], MD, ABFM, CAQSM ?Primary Care and Sports Medicine ?[place] ?

## 2022-01-10 NOTE — Patient Instructions (Signed)
?  Medication Instructions:  ?Rest Hand and use ice. ? ?Follow-Up: ?Your next appointment:   ?Your physician recommends that you schedule a follow-up appointment NO:[facility] this week for left knee injection with Dr. de [place] ? ?You will receive a text message or e-mail with a link to a survey about your care and experience with [place] today! We would greatly appreciate your feedback!  ? ?Thanks for letting us be apart of your health journey!!  ?Primary Care and Sports Medicine  ? ?Dr. Marcy Salvo de [place]  ? ?We encourage you to activate your patient portal called "MyChart".  Sign up information is provided on this After Visit Summary.  MyChart is used to connect with patients for Virtual Visits (Telemedicine).  Patients are able to view lab/test results, encounter notes, upcoming appointments, etc.  Non-urgent messages can be sent to your provider as well. To learn more about what you can do with MyChart, please visit --  [email or web address].    ?

## 2022-01-10 NOTE — Assessment & Plan Note (Signed)
Patient presents for trigger thumb injection of left hand.  Patient was seen in the office and evaluated previously with discussion of possible ultrasound-guided injection to address symptoms.  X-rays completed previously, reviewed, discussed with patient ?She is interested in proceeding with trigger thumb injection, discussed precautions previously and reviewed again today. ?Procedure note as above ?

## 2022-01-11 ENCOUNTER — Ambulatory Visit (INDEPENDENT_AMBULATORY_CARE_PROVIDER_SITE_OTHER): Payer: Medicare Other | Admitting: Family Medicine

## 2022-01-11 ENCOUNTER — Encounter (HOSPITAL_BASED_OUTPATIENT_CLINIC_OR_DEPARTMENT_OTHER): Payer: Self-pay | Admitting: Family Medicine

## 2022-01-11 ENCOUNTER — Ambulatory Visit: Payer: Medicare Other | Attending: Internal Medicine

## 2022-01-11 DIAGNOSIS — M1712 Unilateral primary osteoarthritis, left knee: Secondary | ICD-10-CM | POA: Diagnosis not present

## 2022-01-11 DIAGNOSIS — Z23 Encounter for immunization: Secondary | ICD-10-CM

## 2022-01-11 NOTE — Assessment & Plan Note (Signed)
51 year old female with osteoarthritis of left knee.  Has been seen previously related to left knee symptoms with prior x-rays completed.  Previously discussed treatment options and she has elected that she would like to proceed with steroid injection into left knee.  Discussed risk and benefits related to intervention as well as options of no intervention, surgical consideration, conservative measures including physical therapy, OTC medications. ?Procedure note above ?Plan for follow-up in about 3 months to monitor progress with left knee injection, left trigger thumb injection ?

## 2022-01-11 NOTE — Progress Notes (Signed)
? ? ?  Procedures performed today:   ? ?Procedure: Injection of the left knee ?Verbal informed consent obtained.  ?Time-out conducted.  ?Noted no overlying erythema, induration, or other signs of local infection.  ?Skin prepped in a sterile fashion.  ?Local anesthesia: Topical Ethyl chloride.  ?With sterile technique and under real time ultrasound guidance: 2 cc Kenalog 40mg /mL, 3 cc lidocaine injected easily ?Completed without difficulty  ?Advised to call if fevers/chills, erythema, induration, drainage, or persistent bleeding.  ?Impression: Technically successful corticosteroid injection.. ? ?Independent interpretation of notes and tests performed by another provider:  ? ?None. ? ?Brief History, Exam, Impression, and Recommendations:   ? ?Osteoarthritis of left knee ?51 year old female with osteoarthritis of left knee.  Has been seen previously related to left knee symptoms with prior x-rays completed.  Previously discussed treatment options and she has elected that she would like to proceed with steroid injection into left knee.  Discussed risk and benefits related to intervention as well as options of no intervention, surgical consideration, conservative measures including physical therapy, OTC medications. ?Procedure note above ?Plan for follow-up in about 3 months to monitor progress with left knee injection, left trigger thumb injection ? ? ?___________________________________________ ?Meredith Wright de 55, MD, ABFM, CAQSM ?Primary Care and Sports Medicine ?[facility] ?

## 2022-01-14 ENCOUNTER — Other Ambulatory Visit (HOSPITAL_BASED_OUTPATIENT_CLINIC_OR_DEPARTMENT_OTHER): Payer: Self-pay

## 2022-01-14 MED ORDER — MODERNA COVID-19 BIVAL BOOSTER 50 MCG/0.5ML IM SUSP
INTRAMUSCULAR | 0 refills | Status: DC
Start: 2022-01-14 — End: 2022-05-04
  Filled 2022-01-14: qty 0.5, 1d supply, fill #0

## 2022-01-14 NOTE — Progress Notes (Signed)
? ?  Covid-19 Vaccination Clinic ? ?Name:  Meredith Wright    ?MRN: [id number] ?DOB: [birth year] ? ?01/14/2022 ? ?[name] was observed post Covid-19 immunization for 15 minutes without incident. She was provided with Vaccine Information Sheet and instruction to access the V-Safe system.  ? ?[name] was instructed to call 911 with any severe reactions post vaccine: ?Difficulty breathing  ?Swelling of face and throat  ?A fast heartbeat  ?A bad rash all over body  ?Dizziness and weakness  ? ?Immunizations Administered   ? ? Name Date Dose VIS Date Route  ? Moderna Covid-19 vaccine Bivalent Booster 01/11/2022 10:44 AM 0.5 mL 06/19/2021 Intramuscular  ? Manufacturer: Moderna  ? Lot: [id number]  ? [place]: 80777-282-99  ? ?  ? ? ?

## 2022-01-27 ENCOUNTER — Telehealth (HOSPITAL_BASED_OUTPATIENT_CLINIC_OR_DEPARTMENT_OTHER): Payer: Self-pay | Admitting: Family Medicine

## 2022-01-27 NOTE — Telephone Encounter (Signed)
Pt came in for referral to Physical Therapy downstairs with Onalee Hua for her knee. Was discussed at last OV. ?

## 2022-02-05 ENCOUNTER — Other Ambulatory Visit (HOSPITAL_BASED_OUTPATIENT_CLINIC_OR_DEPARTMENT_OTHER): Payer: Self-pay | Admitting: Family Medicine

## 2022-02-05 DIAGNOSIS — I10 Essential (primary) hypertension: Secondary | ICD-10-CM

## 2022-03-10 NOTE — Telephone Encounter (Signed)
Pt came into office again to inquire about her referral to Physical Therapy downstairs with Lorayne Bender. Pt states that when speaking to someone in their office she was told that she needs another referral to continue treatment with this provider. Please advise. ?

## 2022-03-11 NOTE — Telephone Encounter (Signed)
Looking at referral it was closed due to visits completed. Please advise if you want to send in another referral for physical therapy. ?

## 2022-03-22 ENCOUNTER — Ambulatory Visit (INDEPENDENT_AMBULATORY_CARE_PROVIDER_SITE_OTHER): Payer: Medicare Other | Admitting: Family Medicine

## 2022-03-22 VITALS — BP 168/89 | HR 80 | Ht 67.0 in | Wt 311.6 lb

## 2022-03-22 DIAGNOSIS — I10 Essential (primary) hypertension: Secondary | ICD-10-CM | POA: Diagnosis not present

## 2022-03-22 DIAGNOSIS — R600 Localized edema: Secondary | ICD-10-CM

## 2022-03-22 NOTE — Assessment & Plan Note (Signed)
Patient presents for evaluation of lower extremity edema.  She reports that she has not necessarily noticed significant swelling, however reports that those around her have mentioned it -indicates family and friends have made comments about having increased swelling in bilateral lower extremities.  She denies any discomfort or pain in her lower legs.  Does not feel as though she has specifically noticed changes in swelling during the day.  She has not had any associated shortness of breath, no trouble breathing when lying down at night to sleep. ?On exam, patient has about 1+ pitting edema in bilateral lower extremity extending distally from distal aspect of tibia. ?Discussed potential etiologies including cardiac, vascular, medication.  Patient is currently taking amlodipine, however do not feel that current edema is related to amlodipine.  Also feel that cardiac etiology is less likely given lack of other symptoms, denies any orthopnea, no dyspnea with exertion ?Given this, feel that venous insufficiency or other vascular etiology is most likely.  Did discuss conservative measures including lower extremity elevation, possible use of compression stockings.  Will refer to vascular and vein specialist for further evaluation and treatment recommendations ?

## 2022-03-22 NOTE — Assessment & Plan Note (Signed)
Blood pressure is elevated in office today, has been better controlled in the past ?Currently taking amlodipine 5 mg ?Patient does have follow-up scheduled in the next few weeks, will plan to reassess at that time ?Recommend intermittent monitoring at home, DASH diet, regular aerobic exercise, she has been exercising at the gym fairly regularly ?

## 2022-03-22 NOTE — Patient Instructions (Signed)
?  Medication Instructions:  ?Your physician recommends that you continue on your current medications as directed. Please refer to the Current Medication list given to you today. ?--If you need a refill on any your medications before your next appointment, please call your pharmacy first. If no refills are authorized on file call the office.-- ?Lab Work: ?Your physician has recommended that you have lab work today: No ?If you have labs (blood work) drawn today and your tests are completely normal, you will receive your results via MyChart message OR a phone call from our staff.  ?Please ensure you check your voicemail in the event that you authorized detailed messages to be left on a delegated number. If you have any lab test that is abnormal or we need to change your treatment, we will call you to review the results. ? ?Referrals/Procedures/Imaging: ?Yes ? ? ?Follow-Up: ?Your next appointment:   ?Your physician recommends that you schedule a follow-up appointment in already scheduled with Dr. de [place]. ? ?You will receive a text message or e-mail with a link to a survey about your care and experience with [place] today! We would greatly appreciate your feedback!  ? ?Thanks for letting us be apart of your health journey!!  ?Primary Care and Sports Medicine  ? ?Dr. Marcy Salvo de [place]  ? ?We encourage you to activate your patient portal called "MyChart".  Sign up information is provided on this After Visit Summary.  MyChart is used to connect with patients for Virtual Visits (Telemedicine).  Patients are able to view lab/test results, encounter notes, upcoming appointments, etc.  Non-urgent messages can be sent to your provider as well. To learn more about what you can do with MyChart, please visit --  [email or web address].    ?

## 2022-03-22 NOTE — Progress Notes (Signed)
? ? ?  Procedures performed today:   ? ?None. ? ?Independent interpretation of notes and tests performed by another provider:  ? ?None. ? ?Brief History, Exam, Impression, and Recommendations:   ? ?BP (!) 168/89   Pulse 80   Ht 5\' 7"  (1.702 m)   Wt (!) 311 lb 9.6 oz (141.3 kg)   SpO2 97%   BMI 48.80 kg/m?  ? ?Lower extremity edema ?Patient presents for evaluation of lower extremity edema.  She reports that she has not necessarily noticed significant swelling, however reports that those around her have mentioned it -indicates family and friends have made comments about having increased swelling in bilateral lower extremities.  She denies any discomfort or pain in her lower legs.  Does not feel as though she has specifically noticed changes in swelling during the day.  She has not had any associated shortness of breath, no trouble breathing when lying down at night to sleep. ?On exam, patient has about 1+ pitting edema in bilateral lower extremity extending distally from distal aspect of tibia. ?Discussed potential etiologies including cardiac, vascular, medication.  Patient is currently taking amlodipine, however do not feel that current edema is related to amlodipine.  Also feel that cardiac etiology is less likely given lack of other symptoms, denies any orthopnea, no dyspnea with exertion ?Given this, feel that venous insufficiency or other vascular etiology is most likely.  Did discuss conservative measures including lower extremity elevation, possible use of compression stockings.  Will refer to vascular and vein specialist for further evaluation and treatment recommendations ? ?Hypertension ?Blood pressure is elevated in office today, has been better controlled in the past ?Currently taking amlodipine 5 mg ?Patient does have follow-up scheduled in the next few weeks, will plan to reassess at that time ?Recommend intermittent monitoring at home, DASH diet, regular aerobic exercise, she has been exercising at  the gym fairly regularly ? ? ?___________________________________________ ?Ardel Jagger de , MD, ABFM, CAQSM ?Primary Care and Sports Medicine ?[facility] ?

## 2022-04-11 ENCOUNTER — Ambulatory Visit (HOSPITAL_BASED_OUTPATIENT_CLINIC_OR_DEPARTMENT_OTHER): Payer: Medicare Other | Attending: Family Medicine | Admitting: Physical Therapy

## 2022-04-11 DIAGNOSIS — M25572 Pain in left ankle and joints of left foot: Secondary | ICD-10-CM | POA: Diagnosis present

## 2022-04-11 DIAGNOSIS — G8929 Other chronic pain: Secondary | ICD-10-CM | POA: Diagnosis present

## 2022-04-11 DIAGNOSIS — M25562 Pain in left knee: Secondary | ICD-10-CM | POA: Diagnosis present

## 2022-04-11 DIAGNOSIS — R2689 Other abnormalities of gait and mobility: Secondary | ICD-10-CM

## 2022-04-11 NOTE — Therapy (Addendum)
OUTPATIENT PHYSICAL THERAPY LOWER EXTREMITY EVALUATION/discharge    Patient Name: Meredith Wright MRN: [id number] DOB:[birth year], 51 y.o., female Today's Date: 04/12/2022   PT End of Session - 04/12/22 1050     Visit Number 1    Number of Visits 6    Date for PT Re-Evaluation 05/24/22    PT Start Time 1530    PT Stop Time 1612    PT Time Calculation (min) 42 min    Activity Tolerance Patient tolerated treatment well    Behavior During Therapy [facility] for tasks assessed/performed             Past Medical History:  Diagnosis Date   Multiple sclerosis ([place])    Sickle cell anemia ([place])    trait   Past Surgical History:  Procedure Laterality Date   BRAIN SURGERY  11/07/1996   Biopsy -Dx MS   CESAREAN SECTION     Patient Active Problem List   Diagnosis Date Noted   Lower extremity edema 03/22/2022   Bilateral hand pain 12/27/2021   Trigger thumb of left hand 12/27/2021   Hypertension 04/27/2021   Osteoarthritis of left knee 02/25/2021   Knee pain, bilateral 01/15/2021   BMI 40.0-44.9, adult ([place]) 01/15/2021   Sickle cell trait ([place]) 04/08/2016   DUB (dysfunctional uterine bleeding) 04/01/2014   Multiple sclerosis ([place])     PCP: de [place], Raymond J, MD   REFERRING PROVIDER: de [place], Raymond J, MD   REFERRING DIAG: [phone] (ICD-10-CM) - Primary osteoarthritis of left knee  THERAPY DIAG:  Chronic pain of left knee  Pain in left ankle and joints of left foot  Other abnormalities of gait and mobility  Rationale for Evaluation and Treatment Rehabilitation  ONSET DATE: April 2023  SUBJECTIVE:   SUBJECTIVE STATEMENT: Pt presents with L knee and foot pain, knee pain of insidious onset 6 weeks and foot pain of one week following tripping in kitchen while reaching overhead on toes. Pt states knee has been swollen but under control with wrap.   PERTINENT HISTORY: MS, edema, high BMI  PAIN:  Are you having pain? Yes: NPRS scale: 5/10 Pain location: left knee Pain  description: aching  Aggravating factors: standing and walking  Relieving factors: rest   PAIN:  Are you having pain? Yes: NPRS scale: 5/10 Pain location: left lateral ankle  Pain description: aching Aggravating factors: standing and walking Relieving factors: rest     PRECAUTIONS: None  WEIGHT BEARING RESTRICTIONS No  FALLS:  Has patient fallen in last 6 months? No  LIVING ENVIRONMENT:  OCCUPATION: not working   PLOF: Independent Patient was active prior to onset of pain  PATIENT GOALS   To get back to working out without ankle and knee pain    OBJECTIVE:   DIAGNOSTIC FINDINGS: nothing recent  2022 x-ray left knee   FINDINGS: Mild-to-moderate tricompartmental osteoarthritic changes, joint space narrowing and marginal osteophytes. No acute fracture or dislocation. Findings worse in the medial patellofemoral compartments.   IMPRESSION: tricompartmental osteoarthritic changes as described.   PATIENT SURVEYS:  No FOTO 2nd to progressive neurological condition   COGNITION:  Overall cognitive status: Within functional limits for tasks assessed     SENSATION: WFL  EDEMA:  Figure 8: L ankle: 57.5cm (R 57.8)   POSTURE: No Significant postural limitations  PALPATION: Knee tender to palpation over L lateral jt line and patellar tendon Ankle tender upon palpation of anterior talus and ATFL  LOWER EXTREMITY ROM:  Active ROM Right eval Left eval  Hip flexion    Hip extension    Hip abduction    Hip adduction    Hip internal rotation    Hip external rotation    Knee flexion 112 109, painful  Knee extension    Ankle dorsiflexion 12 0  Ankle plantarflexion    Ankle inversion  20  Ankle eversion  15   (Blank rows = not tested)  LOWER EXTREMITY MMT:  MMT Right eval Left eval  Hip flexion 36.4 33.5  Hip extension    Hip abduction 35.9 32.4  Hip adduction    Hip internal rotation    Hip external rotation    Knee flexion    Knee extension 39.5  26.2  Ankle dorsiflexion    Ankle plantarflexion    Ankle inversion    Ankle eversion     (Blank rows = not tested)    GAIT: Signiifcant lateral movement to the right away from the left LE; decreased hip flexion   TODAY'S TREATMENT: Eval   Exercises - Supine Straight Leg Raises  - 1 x daily - 7 x weekly - 3 sets - 10 reps - Supine Quad Set  - 1 x daily - 7 x weekly - 3 sets - 10 reps - Sidelying Hip Abduction  - 1 x daily - 7 x weekly - 3 sets - 10 reps - Seated Ankle Pumps  - 1 x daily - 7 x weekly - 3 sets - 10 reps - Ankle Inversion Eversion Towel Slide  - 1 x daily - 7 x weekly - 3 sets - 10 reps     PATIENT EDUCATION:  Education details: HEP; symptom management; progression of activity  Person educated: Patient Education method: Explanation, Demonstration, Tactile cues, Verbal cues, and Handouts Education comprehension: verbalized understanding, returned demonstration, verbal cues required, tactile cues required, and needs further education   HOME EXERCISE PROGRAM: Access Code: [email or web address] URL: [email or web address] Date: 04/12/2022 Prepared by: Carolyne Littles  Exercises - Supine Straight Leg Raises  - 1 x daily - 7 x weekly - 3 sets - 10 reps - Supine Quad Set  - 1 x daily - 7 x weekly - 3 sets - 10 reps - Sidelying Hip Abduction  - 1 x daily - 7 x weekly - 3 sets - 10 reps - Seated Ankle Pumps  - 1 x daily - 7 x weekly - 3 sets - 10 reps - Ankle Inversion Eversion Towel Slide  - 1 x daily - 7 x weekly - 3 sets - 10 reps    ASSESSMENT:  CLINICAL IMPRESSION: Patient is a 51 y.o. F who was seen today for physical therapy evaluation and treatment for left knee pain, gait abnormality, and left ankle pain. Her left ankle pain is acute. She felt a pop 5 days ago while doing a workout video and has pain in weight bearing since. She denies ay kind of inversion. Her knee has been painful on and off for over a month.  She has pain with end range knee flexion, and  limited knee patella mobility in all directions. She would benefit from skilled therapy to improve her ability to go back to the gym and her workout videos.    OBJECTIVE IMPAIRMENTS Abnormal gait, decreased activity tolerance, decreased balance, difficulty walking, decreased ROM, decreased strength, hypomobility, obesity, and pain.   ACTIVITY LIMITATIONS lifting, bending, squatting, sleeping, stairs, transfers, and locomotion level  PARTICIPATION LIMITATIONS: community activity and going to the gym   PERSONAL FACTORS 3+ comorbidities:  are also affecting patient's functional outcome.   REHAB POTENTIAL: Good  CLINICAL DECISION MAKING: Evolving/moderate complexity increased pain in her ankle and knee   EVALUATION COMPLEXITY: Moderate   GOALS: Goals reviewed with patient? Yes  SHORT TERM GOALS: Target date: 05/03/2022   Patient will demonstrate equal left knee ROM to right without pain  Baseline: Goal status: INITIAL  2.  Patient will increased gross left knee extension strength by 5 lbs  Baseline:  Goal status: INITIAL  3.  Patient will increase active right DF to 10 degrees without pain  Baseline:  Goal status: INITIAL  LONG TERM GOALS: Target date: 05/24/2022   Patient will ambulate 3000' without increased pain  Baseline:  Goal status: INITIAL  2.  Patient will go up and down steps with minimal pain  Baseline: INITIAL Goal status: INITIAL  3.  Patient will return to the gym without increased pain  Baseline:  Goal status: INITIAL  PLAN: PT FREQUENCY: 2x/week  PT DURATION: 6 weeks  PLANNED INTERVENTIONS: Therapeutic exercises, Therapeutic activity, Neuromuscular re-education, Balance training, Gait training, Patient/Family education, Joint mobilization, Aquatic Therapy, Dry Needling, Electrical stimulation, Cryotherapy, Moist heat, Ultrasound, and Manual therapy  PLAN FOR NEXT SESSION: review HEP; progress to standing exercises; look at single leg stability. If  patient cant tolerated start in the pool; progress to gym activity as tolerated.   PHYSICAL THERAPY DISCHARGE SUMMARY  Visits from Start of Care: 1    Current functional level related to goals / functional outcomes: Unknown, only came for 1 visit   Remaining deficits: Unknown    Education / Equipment: HEP   Patient agrees to discharge. Patient goals were not met. Patient is being discharged due to not returning since the last visit.   Carney Living, PT 04/12/2022, 1:34 PM

## 2022-04-12 ENCOUNTER — Encounter (HOSPITAL_BASED_OUTPATIENT_CLINIC_OR_DEPARTMENT_OTHER): Payer: Self-pay | Admitting: Physical Therapy

## 2022-04-13 ENCOUNTER — Ambulatory Visit (INDEPENDENT_AMBULATORY_CARE_PROVIDER_SITE_OTHER): Payer: Medicare Other | Admitting: Family Medicine

## 2022-04-13 VITALS — BP 156/96 | HR 85 | Ht 67.0 in | Wt 314.6 lb

## 2022-04-13 DIAGNOSIS — R600 Localized edema: Secondary | ICD-10-CM

## 2022-04-13 DIAGNOSIS — I10 Essential (primary) hypertension: Secondary | ICD-10-CM | POA: Diagnosis not present

## 2022-04-13 MED ORDER — VALSARTAN 80 MG PO TABS: 80.0000 mg | ORAL_TABLET | Freq: Every day | ORAL | 1 refills | Status: DC

## 2022-04-13 NOTE — Progress Notes (Signed)
    Procedures performed today:    None.  Independent interpretation of notes and tests performed by another provider:   None.  Brief History, Exam, Impression, and Recommendations:    BP (!) 156/96   Pulse 85   Ht 5\' 7"  (1.702 m)   Wt (!) 314 lb 9.6 oz (142.7 kg)   SpO2 96%   BMI 49.27 kg/m   Hypertension Blood pressure continues to be above goal in office as well as on home readings.  She has been continue with amlodipine.  5 mg daily.  No current issues with chest pain or headaches Discussed options with patient, given that she is having some issues with lower extremity edema, will proceed with stopping amlodipine and starting new medication.  Feel that current lower extremity edema is less likely related to her amlodipine, however feel it would be prudent to remove any potential causes We will have patient start valsartan 80 mg once daily Discussed that further additional medication may be needed if blood pressure not adequately controlled with this, next medication that would be considered is likely indapamide at a low dose Recommend intermittent monitoring of blood pressure at home, DASH diet  Lower extremity edema Previously referred to vascular and vein specialist, referral is ready to be schedule, provided patient with specialist office information today for her to call and set up appointment As above, we will also stop amlodipine  Return in about 3 weeks (around 05/04/2022) for HTN.   ___________________________________________ Ramiya Delahunty de 05/06/2022, MD, ABFM, CAQSM Primary Care and Sports Medicine [facility]

## 2022-04-13 NOTE — Assessment & Plan Note (Signed)
Blood pressure continues to be above goal in office as well as on home readings.  She has been continue with amlodipine.  5 mg daily.  No current issues with chest pain or headaches Discussed options with patient, given that she is having some issues with lower extremity edema, will proceed with stopping amlodipine and starting new medication.  Feel that current lower extremity edema is less likely related to her amlodipine, however feel it would be prudent to remove any potential causes We will have patient start valsartan 80 mg once daily Discussed that further additional medication may be needed if blood pressure not adequately controlled with this, next medication that would be considered is likely indapamide at a low dose Recommend intermittent monitoring of blood pressure at home, DASH diet

## 2022-04-13 NOTE — Assessment & Plan Note (Signed)
Previously referred to vascular and vein specialist, referral is ready to be schedule, provided patient with specialist office information today for her to call and set up appointment As above, we will also stop amlodipine

## 2022-04-27 ENCOUNTER — Other Ambulatory Visit: Payer: Self-pay | Admitting: Physician Assistant

## 2022-04-27 ENCOUNTER — Ambulatory Visit (HOSPITAL_BASED_OUTPATIENT_CLINIC_OR_DEPARTMENT_OTHER): Payer: Self-pay | Admitting: Physical Therapy

## 2022-04-27 DIAGNOSIS — G35 Multiple sclerosis: Secondary | ICD-10-CM

## 2022-04-27 DIAGNOSIS — D849 Immunodeficiency, unspecified: Secondary | ICD-10-CM

## 2022-04-29 ENCOUNTER — Other Ambulatory Visit: Payer: Self-pay | Admitting: *Deleted

## 2022-04-29 DIAGNOSIS — R600 Localized edema: Secondary | ICD-10-CM

## 2022-05-04 ENCOUNTER — Encounter (HOSPITAL_BASED_OUTPATIENT_CLINIC_OR_DEPARTMENT_OTHER): Payer: Self-pay | Admitting: Family Medicine

## 2022-05-04 ENCOUNTER — Ambulatory Visit (INDEPENDENT_AMBULATORY_CARE_PROVIDER_SITE_OTHER): Payer: Medicare Other | Admitting: Family Medicine

## 2022-05-04 VITALS — BP 140/92 | HR 71 | Ht 62.0 in | Wt 307.0 lb

## 2022-05-04 DIAGNOSIS — Z91018 Allergy to other foods: Secondary | ICD-10-CM | POA: Diagnosis not present

## 2022-05-04 DIAGNOSIS — R600 Localized edema: Secondary | ICD-10-CM

## 2022-05-04 DIAGNOSIS — I10 Essential (primary) hypertension: Secondary | ICD-10-CM

## 2022-05-04 MED ORDER — VALSARTAN 160 MG PO TABS: 160.0000 mg | ORAL_TABLET | Freq: Every day | ORAL | 1 refills | Status: DC

## 2022-05-04 NOTE — Assessment & Plan Note (Signed)
Patient does have upcoming appointment with vascular and vein specialist scheduled She is currently using compression stockings which she feels does provide she is not Nestl.  She has had a significant change in her lower extremity edema since office visit.  At last visit we had discontinued her amlodipine in case this was contributing to her lower extremity edema Recommend proceeding with evaluation with vein specialist

## 2022-05-04 NOTE — Progress Notes (Signed)
    Procedures performed today:    None.  Independent interpretation of notes and tests performed by another provider:   None.  Brief History, Exam, Impression, and Recommendations:    BP (!) 140/92   Pulse 71   Ht 5\' 2"  (1.575 m)   Wt (!) 307 lb (139.3 kg)   SpO2 96%   BMI 56.15 kg/m   Hypertension She has been checking her blood pressure at home and reports that readings generally have been slightly elevated with systolic readings in the 150s.  She continues with valsartan 80 mg once daily.  At last visit we did discontinue amlodipine due to some lower extremity edema that she was having.  Edema has been generally has been adequately controlled with use of compression stockings. Blood pressure in office today is borderline Discussed options with patient, at this time we will proceed with slight titration of valsartan, increasing to 160 mg daily Recommend continuing with intermittent monitoring at home, DASH diet If blood pressure continues to be slightly above goal, next step would likely be initiation of low-dose of indapamide  Food allergy She reports that she has some family members with various food allergies including peanuts, shrimp.  She reports that they have been advising her that she should also be, although she seems uncertain as to what she would prefer to have tested.  She has had some concern about lactose intolerance in the past and generally has tried to avoid this in her diet Discussed options with patient, at this time we will proceed with referral to allergist for further evaluation and testing as indicated  Lower extremity edema Patient does have upcoming appointment with vascular and vein specialist scheduled She is currently using compression stockings which she feels does provide she is not Nestl.  She has had a significant change in her lower extremity edema since office visit.  At last visit we had discontinued her amlodipine in case this was contributing  to her lower extremity edema Recommend proceeding with evaluation with vein specialist  Return in about 4 weeks (around 06/01/2022) for HTN.   ___________________________________________ Meredith Wright de 06/03/2022, MD, ABFM, [facility] Primary Care and Sports Medicine [facility]

## 2022-05-04 NOTE — Assessment & Plan Note (Signed)
She has been checking her blood pressure at home and reports that readings generally have been slightly elevated with systolic readings in the 150s.  She continues with valsartan 80 mg once daily.  At last visit we did discontinue amlodipine due to some lower extremity edema that she was having.  Edema has been generally has been adequately controlled with use of compression stockings. Blood pressure in office today is borderline Discussed options with patient, at this time we will proceed with slight titration of valsartan, increasing to 160 mg daily Recommend continuing with intermittent monitoring at home, DASH diet If blood pressure continues to be slightly above goal, next step would likely be initiation of low-dose of indapamide

## 2022-05-04 NOTE — Assessment & Plan Note (Signed)
She reports that she has some family members with various food allergies including peanuts, shrimp.  She reports that they have been advising her that she should also be, although she seems uncertain as to what she would prefer to have tested.  She has had some concern about lactose intolerance in the past and generally has tried to avoid this in her diet Discussed options with patient, at this time we will proceed with referral to allergist for further evaluation and testing as indicated

## 2022-05-05 ENCOUNTER — Other Ambulatory Visit (HOSPITAL_BASED_OUTPATIENT_CLINIC_OR_DEPARTMENT_OTHER): Payer: Self-pay | Admitting: Family Medicine

## 2022-05-05 DIAGNOSIS — I10 Essential (primary) hypertension: Secondary | ICD-10-CM

## 2022-05-08 ENCOUNTER — Ambulatory Visit
Admission: RE | Admit: 2022-05-08 | Discharge: 2022-05-08 | Disposition: A | Discharge status: Home / Self Care | Payer: Medicare Other | Source: Ambulatory Visit | Attending: Physician Assistant | Admitting: Physician Assistant

## 2022-05-08 DIAGNOSIS — G35 Multiple sclerosis: Secondary | ICD-10-CM

## 2022-05-08 DIAGNOSIS — D849 Immunodeficiency, unspecified: Secondary | ICD-10-CM

## 2022-05-08 MED ORDER — GADOBENATE DIMEGLUMINE 529 MG/ML IV SOLN
20.0000 mL | Freq: Once | INTRAVENOUS | Status: AC | PRN
  Administered 2022-05-08: 20 mL via INTRAVENOUS

## 2022-05-16 ENCOUNTER — Ambulatory Visit (HOSPITAL_COMMUNITY)
Admission: RE | Admit: 2022-05-16 | Discharge: 2022-05-16 | Disposition: A | Discharge status: Home / Self Care | Payer: Medicare Other | Source: Ambulatory Visit | Attending: Family Medicine | Admitting: Family Medicine

## 2022-05-16 DIAGNOSIS — R600 Localized edema: Secondary | ICD-10-CM | POA: Insufficient documentation

## 2022-05-18 ENCOUNTER — Ambulatory Visit (INDEPENDENT_AMBULATORY_CARE_PROVIDER_SITE_OTHER): Payer: Medicare Other | Admitting: Physician Assistant

## 2022-05-18 ENCOUNTER — Encounter (HOSPITAL_COMMUNITY): Payer: Medicare Other

## 2022-05-18 VITALS — BP 187/101 | HR 72 | Temp 98.4°F | Resp 20 | Ht 62.0 in | Wt 312.7 lb

## 2022-05-18 DIAGNOSIS — R600 Localized edema: Secondary | ICD-10-CM

## 2022-05-18 NOTE — Progress Notes (Signed)
Office Note     CC:  follow up Requesting Provider:  [place], Buren Kos, MD  HPI: Meredith Wright is a 51 y.o. (06/03/1971) female who presents for evaluation of varicose veins.  Patient states she was referred here by her primary care physician who was concerned with mild edema and varicose veins of bilateral lower extremities right worse than left.  Patient states she is not bothered by leg swelling however would like to be examined to make sure there are no issues.  She denies any history of DVT, venous ulcerations, trauma, or prior vascular intervention.  She does wear knee-high compression on a regular basis.  She denies claudication of bilateral lower extremities.  She is being treated for multiple sclerosis and recently had an Ocrevus infusion which she tolerated well.   Past Medical History:  Diagnosis Date   Multiple sclerosis (H[facility])    Sickle cell anemia ([facility])    trait    Past Surgical History:  Procedure Laterality Date   BRAIN SURGERY  11/07/1996   Biopsy -Dx MS   CESAREAN SECTION      Social History   Socioeconomic History   Marital status: Married    Spouse name: Not on file   Number of children: Not on file   Years of education: Not on file   Highest education level: Not on file  Occupational History   Not on file  Tobacco Use   Smoking status: Never    Passive exposure: Never   Smokeless tobacco: Never  Substance and Sexual Activity   Alcohol use: Never   Drug use: Never   Sexual activity: Yes  Other Topics Concern   Not on file  Social History Narrative   On Disability Secondary To Multiple Sclerosis   Does not work sec to this      Married. 2 children.   Son: 17 y/o   Daughter: 17 y/o   Social Determinants of Community education officer: Not on file  Food Insecurity: Not on file  Transportation Needs: Not on file  Physical Activity: Not on file  Stress: Not on file  Social Connections: Not on file  Intimate Partner Violence: Not on  file    Family History  Problem Relation Age of Onset   Heart disease Mother        murmur   Heart disease Daughter        murmur   Heart disease Maternal Grandmother    Diabetes Maternal Grandmother     Current Outpatient Medications  Medication Sig Dispense Refill   diclofenac (VOLTAREN) 75 MG EC tablet TAKE 1 TABLET BY MOUTH TWICE A DAY 60 tablet 1   gabapentin (NEURONTIN) 300 MG capsule Take 1 capsule by mouth 4 (four) times daily.     levETIRAcetam (KEPPRA) 750 MG tablet Take 750 mg by mouth 2 (two) times daily.     Multiple Vitamin (MULTIVITAMIN) tablet Take 1 tablet by mouth daily.     ocrelizumab (OCREVUS) 300 MG/10ML injection Inject 1 Dose into the vein every 6 (six) months.     omeprazole (PRILOSEC) 20 MG capsule Take 20 mg by mouth daily.     solifenacin (VESICARE) 5 MG tablet Take 1 tablet by mouth in the morning and at bedtime.     valsartan (DIOVAN) 160 MG tablet Take 1 tablet (160 mg total) by mouth daily. 30 tablet 1   No current facility-administered medications for this visit.    Allergies  Allergen Reactions  Lactose Intolerance (Gi) Diarrhea     REVIEW OF SYSTEMS:   [X]  denotes positive finding, [ ]  denotes negative finding Cardiac  Comments:  Chest pain or chest pressure:    Shortness of breath upon exertion:    Short of breath when lying flat:    Irregular heart rhythm:        Vascular    Pain in calf, thigh, or hip brought on by ambulation:    Pain in feet at night that wakes you up from your sleep:     Blood clot in your veins:    Leg swelling:         Pulmonary    Oxygen at home:    Productive cough:     Wheezing:         Neurologic    Sudden weakness in arms or legs:     Sudden numbness in arms or legs:     Sudden onset of difficulty speaking or slurred speech:    Temporary loss of vision in one eye:     Problems with dizziness:         Gastrointestinal    Blood in stool:     Vomited blood:         Genitourinary    Burning  when urinating:     Blood in urine:        Psychiatric    Major depression:         Hematologic    Bleeding problems:    Problems with blood clotting too easily:        Skin    Rashes or ulcers:        Constitutional    Fever or chills:      PHYSICAL EXAMINATION:  Vitals:   05/18/22 0859  BP: (!) 187/101  Pulse: 72  Resp: 20  Temp: 98.4 F (36.9 C)  TempSrc: Temporal  SpO2: 97%  Weight: (!) 312 lb 11.2 oz (141.8 kg)  Height: 5\' 2"  (1.575 m)    General:  WDWN in NAD; vital signs documented above Gait: Not observed HENT: WNL, normocephalic Pulmonary: normal non-labored breathing , without Rales, rhonchi,  wheezing Cardiac: regular HR Abdomen: soft, NT, no masses Skin: without rashes Vascular Exam/Pulses:  Right Left  DP 2+ (normal) 2+ (normal)   Extremities: Mild edema of the ankles; small varicosity right mid shin without tenderness on palpation Musculoskeletal: no muscle wasting or atrophy  Neurologic: A&O X 3;  No focal weakness or paresthesias are detected Psychiatric:  The pt has Normal affect.   Non-Invasive Vascular Imaging:   Right lower extremity venous reflux study negative for DVT Right common femoral vein is incompetent GSV reflux at the level of the knee only    ASSESSMENT/PLAN:: 51 y.o. female here for evaluation for mild edema symptoms of bilateral lower extremities and asymptomatic varicosities  -Right lower extremity venous reflux study was negative for DVT.  Only mild reflux of the deep system as well as the greater saphenous vein however she would not be a candidate for ablation.  If patient were to develop bothersome venous symptoms including edema or tender varicose veins we would recommend continued use of 15 to 20 mmHg knee-high compression on a regular basis.  She should also elevate her legs above the level of her heart periodically throughout the day.  I also encouraged her to avoid prolonged sitting and standing.  We also briefly  discussed weight loss to help venous outflow from her legs.  If  she has a drastic change in exam we can repeat this study however for now she will follow-up as needed.   Dagoberto Ligas, PA-C [facility] [phone]  Clinic MD:   Donzetta Matters

## 2022-05-25 ENCOUNTER — Ambulatory Visit (INDEPENDENT_AMBULATORY_CARE_PROVIDER_SITE_OTHER): Payer: Medicare Other | Admitting: Family Medicine

## 2022-05-25 ENCOUNTER — Encounter (HOSPITAL_BASED_OUTPATIENT_CLINIC_OR_DEPARTMENT_OTHER): Payer: Self-pay | Admitting: Family Medicine

## 2022-05-25 VITALS — BP 178/93 | HR 73 | Ht 62.0 in | Wt 313.8 lb

## 2022-05-25 DIAGNOSIS — I10 Essential (primary) hypertension: Secondary | ICD-10-CM | POA: Diagnosis not present

## 2022-05-25 DIAGNOSIS — Z6841 Body Mass Index (BMI) 40.0 and over, adult: Secondary | ICD-10-CM | POA: Insufficient documentation

## 2022-05-25 MED ORDER — AMLODIPINE BESYLATE 5 MG PO TABS: 5.0000 mg | ORAL_TABLET | Freq: Every day | ORAL | 1 refills | Status: DC

## 2022-05-25 NOTE — Patient Instructions (Signed)

## 2022-05-25 NOTE — Progress Notes (Signed)
    Procedures performed today:    None.  Independent interpretation of notes and tests performed by another provider:   None.  Brief History, Exam, Impression, and Recommendations:    BP (!) 178/93   Pulse 73   Ht 5\' 2"  (1.575 m)   Wt (!) 313 lb 12.8 oz (142.3 kg)   SpO2 96%   BMI 57.39 kg/m   Hypertension She continues to have some elevated blood pressure readings at home, it is elevated in the office today as well She continues with valsartan, has been taking higher dose of 60 mg daily.  Denies any current issues with chest pain or headaches Previously she has been on amlodipine but this was held due to concerns regarding lower extremity edema.  There has not been a significant change in lower extremity edema since discontinuation of amlodipine.  At present edema is adequately controlled with use of compression stockings Discussed options with patient, we will proceed with resuming amlodipine, will also continue with valsartan at current dose Recommend intermittent monitoring at home, DASH diet Plan for close follow-up to monitor response to new medication regimen  Severe obesity (BMI >= 40) (HCC) Patient with notably elevated BMI, she does have questions today regarding assistance with weight loss.  Physical activity is general limited due to body habitus as well as orthopedic concerns.  She has questions about certain medications to assist with weight loss. We did have a general discussion regarding medications which can be utilized to assist with weight loss.  Initially, we will proceed with obtaining baseline labs prior to specific determination on potential pharmacotherapy Plan for close follow-up in the next few weeks to review labs and further discuss pharmacologic options  Return in about 4 weeks (around 06/22/2022) for HTN, weight - NV for FBW a few days prior next appt.   ___________________________________________ Meredith Wright de 06/24/2022, MD, ABFM, CAQSM Primary Care and  Sports Medicine [facility]

## 2022-05-26 ENCOUNTER — Other Ambulatory Visit (HOSPITAL_BASED_OUTPATIENT_CLINIC_OR_DEPARTMENT_OTHER): Payer: Self-pay | Admitting: Family Medicine

## 2022-05-26 DIAGNOSIS — I10 Essential (primary) hypertension: Secondary | ICD-10-CM

## 2022-06-03 NOTE — Assessment & Plan Note (Signed)
Patient with notably elevated BMI, she does have questions today regarding assistance with weight loss.  Physical activity is general limited due to body habitus as well as orthopedic concerns.  She has questions about certain medications to assist with weight loss. We did have a general discussion regarding medications which can be utilized to assist with weight loss.  Initially, we will proceed with obtaining baseline labs prior to specific determination on potential pharmacotherapy Plan for close follow-up in the next few weeks to review labs and further discuss pharmacologic options

## 2022-06-03 NOTE — Assessment & Plan Note (Signed)
She continues to have some elevated blood pressure readings at home, it is elevated in the office today as well She continues with valsartan, has been taking higher dose of 60 mg daily.  Denies any current issues with chest pain or headaches Previously she has been on amlodipine but this was held due to concerns regarding lower extremity edema.  There has not been a significant change in lower extremity edema since discontinuation of amlodipine.  At present edema is adequately controlled with use of compression stockings Discussed options with patient, we will proceed with resuming amlodipine, will also continue with valsartan at current dose Recommend intermittent monitoring at home, DASH diet Plan for close follow-up to monitor response to new medication regimen

## 2022-06-07 NOTE — Progress Notes (Unsigned)
New Patient Note  RE: KANAE Wright MRN: [id number] DOB: 09/21/1971 Date of Office Visit: 06/08/2022  Consult requested by: de Peru, Raymond J, MD Primary care provider: de Peru, Raymond J, MD  Chief Complaint: No chief complaint on file.  History of Present Illness: I had the pleasure of seeing Charleigh Rocco for initial evaluation at the [facility] on 06/07/2022. She is a 51 y.o. female, who is referred here by de Peru, Buren Kos, MD for the evaluation of food allergy.  She reports food allergy to ***. The reaction occurred at the age of ***, after she ate *** amount of ***. Symptoms started within *** and was in the form of *** hives, swelling, wheezing, abdominal pain, diarrhea, vomiting. ***Denies any associated cofactors such as exertion, infection, NSAID use, or alcohol consumption. The symptoms lasted for ***. She was evaluated in ED and received ***. Since this episode, she does *** not report other accidental exposures to ***. She does *** not have access to epinephrine autoinjector and *** needed to use it.   Past work up includes: ***. Dietary History: patient has been eating other foods including ***milk, ***eggs, ***peanut, ***treenuts, ***sesame, ***shellfish, ***fish, ***soy, ***wheat, ***meats, ***fruits and ***vegetables.  She reports reading labels and avoiding *** in diet completely. She tolerates ***baked egg and baked milk products.   05/04/2022 PCP visit: "Food allergy She reports that she has some family members with various food allergies including peanuts, shrimp.  She reports that they have been advising her that she should also be, although she seems uncertain as to what she would prefer to have tested.  She has had some concern about lactose intolerance in the past and generally has tried to avoid this in her diet Discussed options with patient, at this time we will proceed with referral to allergist for further evaluation and testing as  indicated"  Assessment and Plan: [name] is a 51 y.o. female with: No problem-specific Assessment & Plan notes found for this encounter.  No follow-ups on file.  No orders of the defined types were placed in this encounter.  Lab Orders  No laboratory test(s) ordered today    Other allergy screening: Asthma: {Blank single:19197::"yes","no"} Rhino conjunctivitis: {Blank single:19197::"yes","no"} Food allergy: {Blank single:19197::"yes","no"} Medication allergy: {Blank single:19197::"yes","no"} Hymenoptera allergy: {Blank single:19197::"yes","no"} Urticaria: {Blank single:19197::"yes","no"} Eczema:{Blank single:19197::"yes","no"} History of recurrent infections suggestive of immunodeficency: {Blank single:19197::"yes","no"}  Diagnostics: Spirometry:  Tracings reviewed. Her effort: {Blank single:19197::"Good reproducible efforts.","It was hard to get consistent efforts and there is a question as to whether this reflects a maximal maneuver.","Poor effort, data can not be interpreted."} FVC: ***L FEV1: ***L, ***% predicted FEV1/FVC ratio: ***% Interpretation: {Blank single:19197::"Spirometry consistent with mild obstructive disease","Spirometry consistent with moderate obstructive disease","Spirometry consistent with severe obstructive disease","Spirometry consistent with possible restrictive disease","Spirometry consistent with mixed obstructive and restrictive disease","Spirometry uninterpretable due to technique","Spirometry consistent with normal pattern","No overt abnormalities noted given today's efforts"}.  Please see scanned spirometry results for details.  Skin Testing: {Blank single:19197::"Select foods","Environmental allergy panel","Environmental allergy panel and select foods","Food allergy panel","None","Deferred due to recent antihistamines use"}. *** Results discussed with patient/family.   Past Medical History: Patient Active Problem List   Diagnosis Date Noted  .  Severe obesity (BMI >= 40) (HCC) 05/25/2022  . Food allergy 05/04/2022  . Lower extremity edema 03/22/2022  . Bilateral hand pain 12/27/2021  . Trigger thumb of left hand 12/27/2021  . Hypertension 04/27/2021  . Osteoarthritis of left knee 02/25/2021  . Knee pain, bilateral  01/15/2021  . BMI 40.0-44.9, adult (HCC) 01/15/2021  . Sickle cell trait (HCC) 04/08/2016  . DUB (dysfunctional uterine bleeding) 04/01/2014  . Multiple sclerosis (HCC)    Past Medical History:  Diagnosis Date  . Multiple sclerosis (HCC)   . Sickle cell anemia (HCC)    trait   Past Surgical History: Past Surgical History:  Procedure Laterality Date  . BRAIN SURGERY  11/07/1996   Biopsy -Dx MS  . CESAREAN SECTION     Medication List:  Current Outpatient Medications  Medication Sig Dispense Refill  . amLODipine (NORVASC) 5 MG tablet Take 1 tablet (5 mg total) by mouth daily. 90 tablet 1  . diclofenac (VOLTAREN) 75 MG EC tablet TAKE 1 TABLET BY MOUTH TWICE A DAY 60 tablet 1  . gabapentin (NEURONTIN) 300 MG capsule Take 1 capsule by mouth 4 (four) times daily.    Marland Kitchen levETIRAcetam (KEPPRA) 750 MG tablet Take 750 mg by mouth 2 (two) times daily.    . Multiple Vitamin (MULTIVITAMIN) tablet Take 1 tablet by mouth daily.    Marland Kitchen ocrelizumab (OCREVUS) 300 MG/10ML injection Inject 1 Dose into the vein every 6 (six) months.    Marland Kitchen omeprazole (PRILOSEC) 20 MG capsule Take 20 mg by mouth daily.    . solifenacin (VESICARE) 5 MG tablet Take 1 tablet by mouth in the morning and at bedtime.    . valsartan (DIOVAN) 160 MG tablet TAKE 1 TABLET BY MOUTH EVERY DAY 30 tablet 1   No current facility-administered medications for this visit.   Allergies: Allergies  Allergen Reactions  . Lactose Intolerance (Gi) Diarrhea   Social History: Social History   Socioeconomic History  . Marital status: Married    Spouse name: Not on file  . Number of children: Not on file  . Years of education: Not on file  . Highest education  level: Not on file  Occupational History  . Not on file  Tobacco Use  . Smoking status: Never    Passive exposure: Never  . Smokeless tobacco: Never  Substance and Sexual Activity  . Alcohol use: Never  . Drug use: Never  . Sexual activity: Yes  Other Topics Concern  . Not on file  Social History Narrative   On Disability Secondary To Multiple Sclerosis   Does not work sec to this      Married. 2 children.   Son: 30 y/o   Daughter: 59 y/o   Social Determinants of Corporate investment banker Strain: Not on file  Food Insecurity: Not on file  Transportation Needs: Not on file  Physical Activity: Not on file  Stress: Not on file  Social Connections: Not on file   Lives in a ***. Smoking: *** Occupation: ***  Environmental HistorySurveyor, minerals in the house: Copywriter, advertising in the family room: {Blank single:19197::"yes","no"} Carpet in the bedroom: {Blank single:19197::"yes","no"} Heating: {Blank single:19197::"electric","gas","heat pump"} Cooling: {Blank single:19197::"central","window","heat pump"} Pet: {Blank single:19197::"yes ***","no"}  Family History: Family History  Problem Relation Age of Onset  . Heart disease Mother        murmur  . Heart disease Daughter        murmur  . Heart disease Maternal Grandmother   . Diabetes Maternal Grandmother    Problem                               Relation Asthma                                   ***  Eczema                                *** Food allergy                          *** Allergic rhino conjunctivitis     ***  Review of Systems  Constitutional:  Negative for appetite change, chills, fever and unexpected weight change.  HENT:  Negative for congestion and rhinorrhea.   Eyes:  Negative for itching.  Respiratory:  Negative for cough, chest tightness, shortness of breath and wheezing.   Cardiovascular:  Negative for chest pain.  Gastrointestinal:  Negative for abdominal pain.   Genitourinary:  Negative for difficulty urinating.  Skin:  Negative for rash.  Neurological:  Negative for headaches.   Objective: There were no vitals taken for this visit. There is no height or weight on file to calculate BMI. Physical Exam Vitals and nursing note reviewed.  Constitutional:      Appearance: Normal appearance. She is well-developed.  HENT:     Head: Normocephalic and atraumatic.     Right Ear: Tympanic membrane and external ear normal.     Left Ear: Tympanic membrane and external ear normal.     Nose: Nose normal.     Mouth/Throat:     Mouth: Mucous membranes are moist.     Pharynx: Oropharynx is clear.  Eyes:     Conjunctiva/sclera: Conjunctivae normal.  Cardiovascular:     Rate and Rhythm: Normal rate and regular rhythm.     Heart sounds: Normal heart sounds. No murmur heard.    No friction rub. No gallop.  Pulmonary:     Effort: Pulmonary effort is normal.     Breath sounds: Normal breath sounds. No wheezing, rhonchi or rales.  Musculoskeletal:     Cervical back: Neck supple.  Skin:    General: Skin is warm.     Findings: No rash.  Neurological:     Mental Status: She is alert and oriented to person, place, and time.  Psychiatric:        Behavior: Behavior normal.  The plan was reviewed with the patient/family, and all questions/concerned were addressed.  It was my pleasure to see Jannat today and participate in her care. Please feel free to contact me with any questions or concerns.  Sincerely,  Wyline Mood, DO Allergy & Immunology  [facility] office: [phone] [facility] office: [phone]

## 2022-06-08 ENCOUNTER — Ambulatory Visit (INDEPENDENT_AMBULATORY_CARE_PROVIDER_SITE_OTHER): Payer: Medicare Other | Admitting: Allergy

## 2022-06-08 ENCOUNTER — Encounter: Payer: Self-pay | Admitting: Allergy

## 2022-06-08 VITALS — BP 146/84 | HR 72 | Temp 98.0°F | Resp 16 | Ht 67.0 in | Wt 309.4 lb

## 2022-06-08 DIAGNOSIS — Z713 Dietary counseling and surveillance: Secondary | ICD-10-CM

## 2022-06-08 DIAGNOSIS — L819 Disorder of pigmentation, unspecified: Secondary | ICD-10-CM | POA: Insufficient documentation

## 2022-06-08 DIAGNOSIS — R030 Elevated blood-pressure reading, without diagnosis of hypertension: Secondary | ICD-10-CM

## 2022-06-08 DIAGNOSIS — T781XXA Other adverse food reactions, not elsewhere classified, initial encounter: Secondary | ICD-10-CM | POA: Diagnosis not present

## 2022-06-08 DIAGNOSIS — E739 Lactose intolerance, unspecified: Secondary | ICD-10-CM | POA: Diagnosis not present

## 2022-06-08 DIAGNOSIS — T781XXD Other adverse food reactions, not elsewhere classified, subsequent encounter: Secondary | ICD-10-CM

## 2022-06-08 NOTE — Patient Instructions (Addendum)
Today's skin testing showed: Negative to select foods but positive control was negative questioning the validity of the results.  Lactose intolerance May use lactose free milk or take 1-2 lactaid pills right before consuming anything with dairy.  Skin discoloration This does not look allergic. Continue proper skin care. You may want to see a dermatologist for further evaluation if it worsens.  Elevated blood pressure Follow up with PCP regarding this.  Follow up as needed.  Skin care recommendations  Bath time: Always use lukewarm water. AVOID very hot or cold water. Keep bathing time to 5-10 minutes. Do NOT use bubble bath. Use a mild soap and use just enough to wash the dirty areas. Do NOT scrub skin vigorously.  After bathing, pat dry your skin with a towel. Do NOT rub or scrub the skin.  Moisturizers and prescriptions:  ALWAYS apply moisturizers immediately after bathing (within 3 minutes). This helps to lock-in moisture. Use the moisturizer several times a day over the whole body. Good summer moisturizers include: Aveeno, CeraVe, Cetaphil. Good winter moisturizers include: Aquaphor, Vaseline, Cerave, Cetaphil, Eucerin, Vanicream. When using moisturizers along with medications, the moisturizer should be applied about one hour after applying the medication to prevent diluting effect of the medication or moisturize around where you applied the medications. When not using medications, the moisturizer can be continued twice daily as maintenance.  Laundry and clothing: Avoid laundry products with added color or perfumes. Use unscented hypo-allergenic laundry products such as Tide free, Cheer free & gentle, and All free and clear.  If the skin still seems dry or sensitive, you can try double-rinsing the clothes. Avoid tight or scratchy clothing such as wool. Do not use fabric softeners or dyer sheets.

## 2022-06-08 NOTE — Assessment & Plan Note (Signed)
Noted hypopigmentation on abdominal and lower back area. Denies any associated pruritus or rash. . This does not look allergic. Marland Kitchen Continue proper skin care. . Recommend dermatology evaluation if it worsens.

## 2022-06-08 NOTE — Assessment & Plan Note (Signed)
.   Follow up with PCP regarding this. Already on antihypertensive medications.

## 2022-06-08 NOTE — Assessment & Plan Note (Signed)
Concerned about food allergies. Noted diarrhea mainly with dairy products. Tried lactose free milk with no benefit. Hesitant about eating tree nuts, shellfish. Denies any other associated symptoms.   Today's skin testing showed: Negative to select foods but positive control was negative questioning the validity of the results.  Patient states she has NOT taken any antihistamines the past 3 days.  Discussed with patient that she most likely has lactose intolerance to the dairy based on clinical symptoms.   May use lactose free milk or take 1-2 lactaid pills right before consuming anything with dairy.  If patient develops any other symptoms then recommend getting bloodwork to the foods that she is noticing symptoms to in the future.  No indication at this time for any further testing.  No dietary restrictions needed from allergy perspective.

## 2022-06-13 ENCOUNTER — Ambulatory Visit (HOSPITAL_BASED_OUTPATIENT_CLINIC_OR_DEPARTMENT_OTHER): Payer: Medicare Other

## 2022-06-13 DIAGNOSIS — I10 Essential (primary) hypertension: Secondary | ICD-10-CM

## 2022-06-14 LAB — COMPREHENSIVE METABOLIC PANEL
ALT: 21 IU/L (ref 0–32)
AST: 21 IU/L (ref 0–40)
Albumin/Globulin Ratio: 1.3 (ref 1.2–2.2)
Albumin: 4.2 g/dL (ref 3.9–4.9)
Alkaline Phosphatase: 127 IU/L — ABNORMAL HIGH (ref 44–121)
BUN/Creatinine Ratio: 24 — ABNORMAL HIGH (ref 9–23)
BUN: 22 mg/dL (ref 6–24)
Bilirubin Total: 0.3 mg/dL (ref 0.0–1.2)
CO2: 21 mmol/L (ref 20–29)
Calcium: 9.4 mg/dL (ref 8.7–10.2)
Chloride: 105 mmol/L (ref 96–106)
Creatinine, Ser: 0.9 mg/dL (ref 0.57–1.00)
Globulin, Total: 3.2 g/dL (ref 1.5–4.5)
Glucose: 116 mg/dL — ABNORMAL HIGH (ref 70–99)
Potassium: 4.2 mmol/L (ref 3.5–5.2)
Sodium: 142 mmol/L (ref 134–144)
Total Protein: 7.4 g/dL (ref 6.0–8.5)
eGFR: 78 mL/min/{1.73_m2} (ref >59)

## 2022-06-14 LAB — CBC WITH DIFFERENTIAL/PLATELET
Basophils Absolute: 0 10*3/uL (ref 0.0–0.2)
Basos: 1 %
EOS (ABSOLUTE): 0.2 10*3/uL (ref 0.0–0.4)
Eos: 3 %
Hematocrit: 40.9 % (ref 34.0–46.6)
Hemoglobin: 13.8 g/dL (ref 11.1–15.9)
Immature Grans (Abs): 0 10*3/uL (ref 0.0–0.1)
Immature Granulocytes: 0 %
Lymphocytes Absolute: 1.1 10*3/uL (ref 0.7–3.1)
Lymphs: 14 %
MCH: 28.3 pg (ref 26.6–33.0)
MCHC: 33.7 g/dL (ref 31.5–35.7)
MCV: 84 fL (ref 79–97)
Monocytes Absolute: 0.3 10*3/uL (ref 0.1–0.9)
Monocytes: 4 %
Neutrophils Absolute: 6.2 10*3/uL (ref 1.4–7.0)
Neutrophils: 78 %
Platelets: 342 10*3/uL (ref 150–450)
RBC: 4.87 x10E6/uL (ref 3.77–5.28)
RDW: 14.1 % (ref 11.7–15.4)
WBC: 8 10*3/uL (ref 3.4–10.8)

## 2022-06-14 LAB — LIPID PANEL
Chol/HDL Ratio: 5.4 ratio — ABNORMAL HIGH (ref 0.0–4.4)
Cholesterol, Total: 204 mg/dL — ABNORMAL HIGH (ref 100–199)
HDL: 38 mg/dL — ABNORMAL LOW (ref >39)
LDL Chol Calc (NIH): 123 mg/dL — ABNORMAL HIGH (ref 0–99)
Triglycerides: 241 mg/dL — ABNORMAL HIGH (ref 0–149)
VLDL Cholesterol Cal: 43 mg/dL — ABNORMAL HIGH (ref 5–40)

## 2022-06-14 LAB — TSH RFX ON ABNORMAL TO FREE T4: TSH: 1.95 u[IU]/mL (ref 0.450–4.500)

## 2022-06-22 ENCOUNTER — Ambulatory Visit (INDEPENDENT_AMBULATORY_CARE_PROVIDER_SITE_OTHER): Payer: Medicare Other | Admitting: Family Medicine

## 2022-06-22 ENCOUNTER — Encounter (HOSPITAL_BASED_OUTPATIENT_CLINIC_OR_DEPARTMENT_OTHER): Payer: Self-pay | Admitting: Family Medicine

## 2022-06-22 DIAGNOSIS — I10 Essential (primary) hypertension: Secondary | ICD-10-CM

## 2022-06-22 NOTE — Progress Notes (Signed)
    Procedures performed today:    None.  Independent interpretation of notes and tests performed by another provider:   None.  Brief History, Exam, Impression, and Recommendations:    BP (!) 148/96   Pulse 67   Ht 5' 6.4" (1.687 m)   Wt (!) 309 lb (140.2 kg)   SpO2 95%   BMI 49.27 kg/m   Hypertension Blood pressure borderline in office today, it is better controlled as compared to last office visit.  She previously had been on amlodipine however this was stopped due to concern for possible lower extremity edema as side effect.  She has been taking valsartan and at last visit was started back on amlodipine as it did not seem that lower extremity edema was impacted by stopping amlodipine.  She has been doing well with resumption of amlodipine, blood pressure is better controlled at this time as well.  She denies any current issues related chest pain or headaches. For now, we will continue with current medication regimen without change.  Recommend intermittent monitoring of blood pressure at home, DASH diet We will also refer to healthy weight and wellness clinic to assist with lifestyle modifications, working towards gradual weight loss which ultimately would also be beneficial in regards to controlling blood pressure  Severe obesity (BMI >= 40) (HCC) We reviewed labs and discussed treatment options today.  After discussion, she would prefer to avoid any additional medications for now.  She is interested in referral to healthy weight and wellness clinic, referral placed today.  Spent 33 minutes on this patient encounter, including preparation, chart review, face-to-face counseling with patient and coordination of care, and documentation of encounter  Return in about 3 months (around 09/22/2022) for HTN.   ___________________________________________ Rishaan Gunner de Peru, MD, ABFM, CAQSM Primary Care and Sports Medicine [facility]

## 2022-06-22 NOTE — Patient Instructions (Signed)
  Medication Instructions:  Your physician recommends that you continue on your current medications as directed. Please refer to the Current Medication list given to you today. --If you need a refill on any your medications before your next appointment, please call your pharmacy first. If no refills are authorized on file call the office.-- Lab Work: Your physician has recommended that you have lab work today: No If you have labs (blood work) drawn today and your tests are completely normal, you will receive your results via MyChart message OR a phone call from our staff.  Please ensure you check your voicemail in the event that you authorized detailed messages to be left on a delegated number. If you have any lab test that is abnormal or we need to change your treatment, we will call you to review the results.  Referrals/Procedures/Imaging: No  Follow-Up: Your next appointment:   Your physician recommends that you schedule a follow-up appointment in: 2-3 months with Dr. de Cuba.  You will receive a text message or e-mail with a link to a survey about your care and experience with us today! We would greatly appreciate your feedback!   Thanks for letting us be apart of your health journey!!  Primary Care and Sports Medicine   Dr. Raymond de Cuba   We encourage you to activate your patient portal called "MyChart".  Sign up information is provided on this After Visit Summary.  MyChart is used to connect with patients for Virtual Visits (Telemedicine).  Patients are able to view lab/test results, encounter notes, upcoming appointments, etc.  Non-urgent messages can be sent to your provider as well. To learn more about what you can do with MyChart, please visit --  [email or web address].    

## 2022-06-25 ENCOUNTER — Other Ambulatory Visit (HOSPITAL_BASED_OUTPATIENT_CLINIC_OR_DEPARTMENT_OTHER): Payer: Self-pay | Admitting: Family Medicine

## 2022-06-25 DIAGNOSIS — I10 Essential (primary) hypertension: Secondary | ICD-10-CM

## 2022-07-18 ENCOUNTER — Ambulatory Visit: Payer: Medicare Other | Admitting: Allergy

## 2022-07-21 ENCOUNTER — Other Ambulatory Visit (HOSPITAL_BASED_OUTPATIENT_CLINIC_OR_DEPARTMENT_OTHER): Payer: Self-pay | Admitting: Family Medicine

## 2022-07-21 DIAGNOSIS — I10 Essential (primary) hypertension: Secondary | ICD-10-CM

## 2022-07-22 NOTE — Assessment & Plan Note (Signed)
Blood pressure borderline in office today, it is better controlled as compared to last office visit.  She previously had been on amlodipine however this was stopped due to concern for possible lower extremity edema as side effect.  She has been taking valsartan and at last visit was started back on amlodipine as it did not seem that lower extremity edema was impacted by stopping amlodipine.  She has been doing well with resumption of amlodipine, blood pressure is better controlled at this time as well.  She denies any current issues related chest pain or headaches. For now, we will continue with current medication regimen without change.  Recommend intermittent monitoring of blood pressure at home, DASH diet We will also refer to healthy weight and wellness clinic to assist with lifestyle modifications, working towards gradual weight loss which ultimately would also be beneficial in regards to controlling blood pressure

## 2022-07-22 NOTE — Assessment & Plan Note (Signed)
We reviewed labs and discussed treatment options today.  After discussion, she would prefer to avoid any additional medications for now.  She is interested in referral to healthy weight and wellness clinic, referral placed today.

## 2022-08-24 ENCOUNTER — Other Ambulatory Visit (HOSPITAL_BASED_OUTPATIENT_CLINIC_OR_DEPARTMENT_OTHER): Payer: Self-pay

## 2022-08-24 DIAGNOSIS — I10 Essential (primary) hypertension: Secondary | ICD-10-CM

## 2022-08-24 MED ORDER — VALSARTAN 160 MG PO TABS: 160.0000 mg | ORAL_TABLET | Freq: Every day | ORAL | 1 refills | Status: DC

## 2022-08-29 ENCOUNTER — Encounter: Payer: Self-pay | Admitting: *Deleted

## 2022-09-22 ENCOUNTER — Ambulatory Visit (INDEPENDENT_AMBULATORY_CARE_PROVIDER_SITE_OTHER): Payer: Medicare Other | Admitting: Family Medicine

## 2022-09-22 ENCOUNTER — Encounter (HOSPITAL_BASED_OUTPATIENT_CLINIC_OR_DEPARTMENT_OTHER): Payer: Self-pay | Admitting: Family Medicine

## 2022-09-22 VITALS — BP 144/93 | HR 70 | Temp 97.8°F | Ht 66.4 in | Wt 314.4 lb

## 2022-09-22 DIAGNOSIS — I10 Essential (primary) hypertension: Secondary | ICD-10-CM | POA: Diagnosis not present

## 2022-09-22 DIAGNOSIS — Z23 Encounter for immunization: Secondary | ICD-10-CM

## 2022-09-22 NOTE — Patient Instructions (Signed)
  Medication Instructions:  Your physician recommends that you continue on your current medications as directed. Please refer to the Current Medication list given to you today. --If you need a refill on any your medications before your next appointment, please call your pharmacy first. If no refills are authorized on file call the office.-- Lab Work: Your physician has recommended that you have lab work today: No If you have labs (blood work) drawn today and your tests are completely normal, you will receive your results via MyChart message OR a phone call from our staff.  Please ensure you check your voicemail in the event that you authorized detailed messages to be left on a delegated number. If you have any lab test that is abnormal or we need to change your treatment, we will call you to review the results.  Referrals/Procedures/Imaging: No  Follow-Up: Your next appointment:   Your physician recommends that you schedule a follow-up appointment in: 2-3 months with Dr. de Cuba.  You will receive a text message or e-mail with a link to a survey about your care and experience with us today! We would greatly appreciate your feedback!   Thanks for letting us be apart of your health journey!!  Primary Care and Sports Medicine   Dr. Raymond de Cuba   We encourage you to activate your patient portal called "MyChart".  Sign up information is provided on this After Visit Summary.  MyChart is used to connect with patients for Virtual Visits (Telemedicine).  Patients are able to view lab/test results, encounter notes, upcoming appointments, etc.  Non-urgent messages can be sent to your provider as well. To learn more about what you can do with MyChart, please visit --  [email or web address].    

## 2022-09-22 NOTE — Progress Notes (Signed)
    Procedures performed today:    None.  Independent interpretation of notes and tests performed by another provider:   None.  Brief History, Exam, Impression, and Recommendations:    BP (!) 144/93 (BP Location: Right Arm, Patient Position: Sitting, Cuff Size: Large)   Pulse 70   Temp 97.8 F (36.6 C) (Oral)   Ht 5' 6.4" (1.687 m)   Wt (!) 314 lb 6.4 oz (142.6 kg)   SpO2 100%   BMI 50.14 kg/m   Hypertension Blood pressure is borderline in office today, did improve on repeat check.  She has been checking her blood pressure at home, generally systolic readings are in the 120s to 130s.  She denies any current issues with chest pain or headaches.  She continues with amlodipine and valsartan, denies any issues with medication, has not noticed any worsening lower extremity edema. Given reported good control at home and borderline readings in office today, we will not make any changes to medication dosages at this time and we will plan to follow-up closely to monitor blood pressure.  Advised patient to bring home blood pressure cuff with her to next appointment so that we can compare home blood pressure cuff to our cuff in the office and ensure that home readings are accurate Recommend intermittent monitoring of blood pressure at home, DASH diet  Return in about 3 months (around 12/23/2022) for HTN. Recommend seasonal influenza vaccine, patient amenable, administered today   ___________________________________________ Winferd Wease de [place], MD, ABFM, CAQSM Primary Care and Sports Medicine [facility]  Outpatient Encounter Medications as of 09/22/2022  Medication Sig   amLODipine (NORVASC) 5 MG tablet Take 1 tablet (5 mg total) by mouth daily.   diclofenac (VOLTAREN) 75 MG EC tablet TAKE 1 TABLET BY MOUTH TWICE A DAY   gabapentin (NEURONTIN) 300 MG capsule Take 1 capsule by mouth 4 (four) times daily.   levETIRAcetam (KEPPRA) 750 MG tablet Take 750 mg by mouth 2 (two)  times daily.   Multiple Vitamin (MULTIVITAMIN) tablet Take 1 tablet by mouth daily.   ocrelizumab (OCREVUS) 300 MG/10ML injection Inject 1 Dose into the vein every 6 (six) months.   omeprazole (PRILOSEC) 20 MG capsule Take 20 mg by mouth daily.   solifenacin (VESICARE) 5 MG tablet Take 1 tablet by mouth in the morning and at bedtime.   valsartan (DIOVAN) 160 MG tablet Take 1 tablet (160 mg total) by mouth daily.   No facility-administered encounter medications on file as of 09/22/2022.

## 2022-09-22 NOTE — Assessment & Plan Note (Signed)
Blood pressure is borderline in office today, did improve on repeat check.  She has been checking her blood pressure at home, generally systolic readings are in the 120s to 130s.  She denies any current issues with chest pain or headaches.  She continues with amlodipine and valsartan, denies any issues with medication, has not noticed any worsening lower extremity edema. Given reported good control at home and borderline readings in office today, we will not make any changes to medication dosages at this time and we will plan to follow-up closely to monitor blood pressure.  Advised patient to bring home blood pressure cuff with her to next appointment so that we can compare home blood pressure cuff to our cuff in the office and ensure that home readings are accurate Recommend intermittent monitoring of blood pressure at home, DASH diet

## 2022-10-03 ENCOUNTER — Other Ambulatory Visit (HOSPITAL_BASED_OUTPATIENT_CLINIC_OR_DEPARTMENT_OTHER): Payer: Self-pay | Admitting: Family Medicine

## 2022-10-03 DIAGNOSIS — I10 Essential (primary) hypertension: Secondary | ICD-10-CM

## 2022-10-27 ENCOUNTER — Encounter (HOSPITAL_BASED_OUTPATIENT_CLINIC_OR_DEPARTMENT_OTHER): Payer: Medicare Other | Admitting: Obstetrics & Gynecology

## 2022-11-01 ENCOUNTER — Encounter (INDEPENDENT_AMBULATORY_CARE_PROVIDER_SITE_OTHER): Payer: Self-pay

## 2022-11-09 ENCOUNTER — Encounter (INDEPENDENT_AMBULATORY_CARE_PROVIDER_SITE_OTHER): Payer: Medicare Other | Admitting: Family Medicine

## 2022-11-09 DIAGNOSIS — Z0289 Encounter for other administrative examinations: Secondary | ICD-10-CM

## 2022-11-30 ENCOUNTER — Encounter (INDEPENDENT_AMBULATORY_CARE_PROVIDER_SITE_OTHER): Payer: Self-pay | Admitting: Family Medicine

## 2022-11-30 ENCOUNTER — Ambulatory Visit (INDEPENDENT_AMBULATORY_CARE_PROVIDER_SITE_OTHER): Payer: 59 | Admitting: Family Medicine

## 2022-11-30 VITALS — BP 150/84 | HR 97 | Temp 98.8°F | Ht 67.0 in | Wt 308.0 lb

## 2022-11-30 DIAGNOSIS — R5383 Other fatigue: Secondary | ICD-10-CM | POA: Diagnosis not present

## 2022-11-30 DIAGNOSIS — E785 Hyperlipidemia, unspecified: Secondary | ICD-10-CM

## 2022-11-30 DIAGNOSIS — R0602 Shortness of breath: Secondary | ICD-10-CM

## 2022-11-30 DIAGNOSIS — G35 Multiple sclerosis: Secondary | ICD-10-CM

## 2022-11-30 DIAGNOSIS — I10 Essential (primary) hypertension: Secondary | ICD-10-CM | POA: Diagnosis not present

## 2022-11-30 DIAGNOSIS — R739 Hyperglycemia, unspecified: Secondary | ICD-10-CM | POA: Diagnosis not present

## 2022-11-30 DIAGNOSIS — Z6841 Body Mass Index (BMI) 40.0 and over, adult: Secondary | ICD-10-CM

## 2022-11-30 DIAGNOSIS — Z1331 Encounter for screening for depression: Secondary | ICD-10-CM

## 2022-11-30 DIAGNOSIS — D573 Sickle-cell trait: Secondary | ICD-10-CM

## 2022-12-01 LAB — CBC WITH DIFFERENTIAL/PLATELET
Basophils Absolute: 0.1 10*3/uL (ref 0.0–0.2)
Basos: 0 %
EOS (ABSOLUTE): 0.2 10*3/uL (ref 0.0–0.4)
Eos: 2 %
Hematocrit: 40.3 % (ref 34.0–46.6)
Hemoglobin: 13.3 g/dL (ref 11.1–15.9)
Immature Grans (Abs): 0 10*3/uL (ref 0.0–0.1)
Immature Granulocytes: 0 %
Lymphocytes Absolute: 1.7 10*3/uL (ref 0.7–3.1)
Lymphs: 14 %
MCH: 27.3 pg (ref 26.6–33.0)
MCHC: 33 g/dL (ref 31.5–35.7)
MCV: 83 fL (ref 79–97)
Monocytes Absolute: 0.9 10*3/uL (ref 0.1–0.9)
Monocytes: 7 %
Neutrophils Absolute: 9.6 10*3/uL — ABNORMAL HIGH (ref 1.4–7.0)
Neutrophils: 77 %
Platelets: 364 10*3/uL (ref 150–450)
RBC: 4.87 x10E6/uL (ref 3.77–5.28)
RDW: 14.5 % (ref 11.7–15.4)
WBC: 12.4 10*3/uL — ABNORMAL HIGH (ref 3.4–10.8)

## 2022-12-01 LAB — COMPREHENSIVE METABOLIC PANEL WITH GFR
ALT: 22 IU/L (ref 0–32)
AST: 16 IU/L (ref 0–40)
Albumin/Globulin Ratio: 1.4 (ref 1.2–2.2)
Albumin: 4.3 g/dL (ref 3.9–4.9)
Alkaline Phosphatase: 142 IU/L — ABNORMAL HIGH (ref 44–121)
BUN/Creatinine Ratio: 21 (ref 9–23)
BUN: 17 mg/dL (ref 6–24)
Bilirubin Total: 0.4 mg/dL (ref 0.0–1.2)
CO2: 21 mmol/L (ref 20–29)
Calcium: 9.3 mg/dL (ref 8.7–10.2)
Chloride: 103 mmol/L (ref 96–106)
Creatinine, Ser: 0.81 mg/dL (ref 0.57–1.00)
Globulin, Total: 3.1 g/dL (ref 1.5–4.5)
Glucose: 81 mg/dL (ref 70–99)
Potassium: 4 mmol/L (ref 3.5–5.2)
Sodium: 142 mmol/L (ref 134–144)
Total Protein: 7.4 g/dL (ref 6.0–8.5)
eGFR: 88 mL/min/1.73

## 2022-12-01 LAB — FOLATE: Folate: 13.4 ng/mL

## 2022-12-01 LAB — HEMOGLOBIN A1C
Est. average glucose Bld gHb Est-mCnc: 128 mg/dL
Hgb A1c MFr Bld: 6.1 % — ABNORMAL HIGH (ref 4.8–5.6)

## 2022-12-01 LAB — TSH: TSH: 1.52 u[IU]/mL (ref 0.450–4.500)

## 2022-12-01 LAB — LIPID PANEL WITH LDL/HDL RATIO
Cholesterol, Total: 243 mg/dL — ABNORMAL HIGH (ref 100–199)
HDL: 42 mg/dL (ref >39)
LDL Chol Calc (NIH): 157 mg/dL — ABNORMAL HIGH (ref 0–99)
LDL/HDL Ratio: 3.7 ratio — ABNORMAL HIGH (ref 0.0–3.2)
Triglycerides: 236 mg/dL — ABNORMAL HIGH (ref 0–149)
VLDL Cholesterol Cal: 44 mg/dL — ABNORMAL HIGH (ref 5–40)

## 2022-12-01 LAB — VITAMIN D 25 HYDROXY (VIT D DEFICIENCY, FRACTURES): Vit D, 25-Hydroxy: 11.7 ng/mL — ABNORMAL LOW (ref 30.0–100.0)

## 2022-12-01 LAB — T3: T3, Total: 122 ng/dL (ref 71–180)

## 2022-12-01 LAB — VITAMIN B12: Vitamin B-12: 455 pg/mL (ref 232–1245)

## 2022-12-01 LAB — T4, FREE: Free T4: 1.18 ng/dL (ref 0.82–1.77)

## 2022-12-01 LAB — INSULIN, RANDOM: INSULIN: 13.7 u[IU]/mL (ref 2.6–24.9)

## 2022-12-12 NOTE — Progress Notes (Signed)
Chief Complaint:   OBESITY Meredith Wright (MR# [id number]) is a 52 y.o. female who presents for evaluation and treatment of obesity and related comorbidities. Current BMI is Body mass index is 49.24 kg/m. Meredith Wright has been struggling with her weight for many years and has been unsuccessful in either losing weight, maintaining weight loss, or reaching her healthy weight goal.  Meredith Wright lives at home with her husband, [name].  Previously was a Biomedical scientist at [employer] is am-strawberry, blueberry, cherry, grapes, banana, lemon and apple cider vinegar and spinach at 11:30 am banana 0000000 pm-slice of pizza with peperoini bell peppers and pineapple and spinach.  Chili's Caesar salad, salmon fish, rice and broccoli and strawberry lemonade-eats all (satisfied).  Meredith Wright is currently in the action stage of change and ready to dedicate time achieving and maintaining a healthier weight. Wright is interested in becoming our patient and working on intensive lifestyle modifications including (but not limited to) diet and exercise for weight loss.  Meredith Wright's habits were reviewed today and are as follows: Her family eats meals together, she thinks her family will eat healthier with her, her desired weight loss is 129 pounds, she has been heavy most of her life, she started gaining weight when she was diagnosed with MS, her heaviest weight ever was 308 pounds, she has significant food cravings issues, she is frequently drinking liquids with calories, and she frequently makes poor food choices.  Depression Screen Meredith Wright's Food and Mood (modified PHQ-9) Wright was 10.     09/22/2022    9:57 AM  Depression screen PHQ 2/9  Decreased Interest 0  Down, Depressed, Hopeless 0  PHQ - 2 Wright 0  Altered sleeping 0  Tired, decreased energy 0  Change in appetite 0  Feeling bad or failure about yourself  0  Trouble concentrating 0  Moving slowly or fidgety/restless 0  Suicidal thoughts 0  PHQ-9 Wright 0  Difficult doing  work/chores Not difficult at all   Subjective:   1. Other fatigue Trenady admits to daytime somnolence and reports waking up still tired. Patient has a history of symptoms of daytime fatigue, morning fatigue, and morning headache. Meredith Wright generally gets 6 hours of sleep per night, and states that she has nightime awakenings and difficulty falling back asleep if awakened. Meredith Wright is present. Apneic episodes are not present. Meredith Wright is 20.    2. SOBOE (shortness of breath on exertion) Emmilia notes increasing shortness of breath with exercising and seems to be worsening over time with weight gain. She notes getting out of breath sooner with activity than she used to. This has not gotten worse recently. Javette denies shortness of breath at rest or orthopnea.   3. Primary hypertension Keighan is on Valsartan and Amlodipine.  Blood pressure elevated today and almost all doctor appointments prior.  Blood pressure at home [id number] systolically.  4. Multiple sclerosis (HCC) [name] sees neurology but does not know who.  Reports that she will continue current medications.   5. Dyslipidemia Last LDL of 123, HDL of 38, Trig of 241.  Not on medication.  6. Hyperglycemia Prior Fasting blood sugars elevated A1c.   7. Sickle cell trait ([place]) T[place]ka was diagnosed in her 1st pregnancy.  Last CBC within normal limits.  Assessment/Plan:   1. Other fatigue We will obtain labs today.  Meredith Wright does feel that her weight is causing her energy to be lower than it should be. Fatigue may be related to obesity, depression or many other  causes. Labs will be ordered, and in the meanwhile, Meredith Wright will focus on self care including making healthy food choices, increasing physical activity and focusing on stress reduction.   - EKG 12-Lead - Vitamin B12 - Folate - VITAMIN D 25 Hydroxy (Vit-D Deficiency, Fractures) - TSH - T4, free - T3  2. SOBOE (shortness of breath on exertion) Meredith Wright does feel that she gets out of breath  more easily that she used to when she exercises. Meredith Wright's shortness of breath appears to be obesity related and exercise induced. She has agreed to work on weight loss and gradually increase exercise to treat her exercise induced shortness of breath. Will continue to monitor closely.   3. Primary hypertension We will obtain labs today.   Will follow up at next appointment.  - Comprehensive metabolic panel  4. Multiple sclerosis ([name]) Will follow up with patient on Provider's name, location and then request recent appointment note.  5. Dyslipidemia We will obtain labs today.  - Lipid Panel With LDL/HDL Ratio  6. Hyperglycemia We will obtain labs today.  - Hemoglobin A1c - Insulin, random  7. Sickle cell trait ([place]) We will obtain labs today.  - CBC with Differential/Platelet  8. Depression screening Meredith Wright had a positive depression screening. Depression is commonly associated with obesity and often results in emotional eating behaviors. We will monitor this closely and work on CBT to help improve the non-hunger eating patterns. Referral to Psychology may be required if no improvement is seen as she continues in our clinic.   9. Class 3 severe obesity with serious comorbidity and body mass index (BMI) of 45.0 to 49.9 in adult, unspecified obesity type (HCC) Meredith Wright is currently in the action stage of change and her goal is to continue with weight loss efforts. I recommend Meredith Wright begin the structured treatment plan as follows:  She has agreed to the Category 4 Plan with lactose substitutions.  Exercise goals: No exercise has been prescribed at this time.   Behavioral modification strategies: increasing lean protein intake, decreasing eating out, meal planning and cooking strategies, keeping healthy foods in the home, and planning for success.  She was informed of the importance of frequent follow-up visits to maximize her success with intensive lifestyle modifications for her multiple  health conditions. She was informed we would discuss her lab results at her next visit unless there is a critical issue that needs to be addressed sooner. Jani agreed to keep her next visit at the agreed upon time to discuss these results.  Objective:   Blood pressure (!) 150/84, pulse 97, temperature 98.8 F (37.1 C), height 5' 7"$  (1.702 m), SpO2 96 %. Body mass index is 49.24 kg/m.  EKG: Normal sinus rhythm, rate 87 bpm.  Indirect Calorimeter completed today shows a VO2 of 331 ml and a REE of 2290.  Her calculated basal metabolic rate is [id number] thus her basal metabolic rate is better than expected.  General: Cooperative, alert, well developed, in no acute distress. HEENT: Conjunctivae and lids unremarkable. Cardiovascular: Regular rhythm.  Lungs: Normal work of breathing. Neurologic: No focal deficits.   Lab Results  Component Value Date   CREATININE 0.81 11/30/2022   BUN 17 11/30/2022   NA 142 11/30/2022   K 4.0 11/30/2022   CL 103 11/30/2022   CO2 21 11/30/2022   Lab Results  Component Value Date   ALT 22 11/30/2022   AST 16 11/30/2022   ALKPHOS 142 (H) 11/30/2022   BILITOT 0.4 11/30/2022   Lab  Results  Component Value Date   HGBA1C 6.1 (H) 11/30/2022   Lab Results  Component Value Date   INSULIN 13.7 11/30/2022   Lab Results  Component Value Date   TSH 1.520 11/30/2022   Lab Results  Component Value Date   CHOL 243 (H) 11/30/2022   HDL 42 11/30/2022   LDLCALC 157 (H) 11/30/2022   TRIG 236 (H) 11/30/2022   CHOLHDL 5.4 (H) 06/13/2022   Lab Results  Component Value Date   WBC 12.4 (H) 11/30/2022   HGB 13.3 11/30/2022   HCT 40.3 11/30/2022   MCV 83 11/30/2022   PLT 364 11/30/2022   Lab Results  Component Value Date   IRON 43 06/06/2013   TIBC 328 06/06/2013   Attestation Statements:   Reviewed by clinician on day of visit: allergies, medications, problem list, medical history, surgical history, family history, social history, and previous encounter  notes.  Time spent on visit including pre-visit chart review and post-visit charting and care was 80 minutes.   I, Elnora Morrison, RMA am acting as transcriptionist for Coralie Common, MD. This is the patient's first visit at [facility]. The patient's NEW PATIENT PACKET was reviewed at length. Included in the packet: current and past health history, medications, allergies, ROS, gynecologic history (women only), surgical history, family history, social history, weight history, weight loss surgery history (for those that have had weight loss surgery), nutritional evaluation, mood and food questionnaire, PHQ9, Meredith questionnaire, sleep habits questionnaire, patient life and health improvement goals questionnaire. These will all be scanned into the patient's chart under media.   During the visit, I independently reviewed the patient's EKG, bioimpedance scale results, and indirect calorimeter results. I used this information to tailor a meal plan for the patient that will help her to lose weight and will improve her obesity-related conditions going forward. I performed a medically necessary appropriate examination and/or evaluation. I discussed the assessment and treatment plan with the patient. The patient was provided an opportunity to ask questions and all were answered. The patient agreed with the plan and demonstrated an understanding of the instructions. Labs were ordered at this visit and will be reviewed at the next visit unless more critical results need to be addressed immediately. Clinical information was updated and documented in the EMR.   I have reviewed the above documentation for accuracy and completeness, and I agree with the above. - Coralie Common, MD

## 2022-12-13 ENCOUNTER — Ambulatory Visit (HOSPITAL_BASED_OUTPATIENT_CLINIC_OR_DEPARTMENT_OTHER): Payer: Medicare Other

## 2022-12-14 ENCOUNTER — Encounter (INDEPENDENT_AMBULATORY_CARE_PROVIDER_SITE_OTHER): Payer: Self-pay | Admitting: Family Medicine

## 2022-12-14 ENCOUNTER — Ambulatory Visit (INDEPENDENT_AMBULATORY_CARE_PROVIDER_SITE_OTHER): Payer: 59 | Admitting: Family Medicine

## 2022-12-14 VITALS — BP 148/82 | HR 62 | Temp 98.4°F | Ht 67.0 in

## 2022-12-14 DIAGNOSIS — R7303 Prediabetes: Secondary | ICD-10-CM

## 2022-12-14 DIAGNOSIS — E559 Vitamin D deficiency, unspecified: Secondary | ICD-10-CM

## 2022-12-14 DIAGNOSIS — E7849 Other hyperlipidemia: Secondary | ICD-10-CM

## 2022-12-14 DIAGNOSIS — E785 Hyperlipidemia, unspecified: Secondary | ICD-10-CM

## 2022-12-14 DIAGNOSIS — E669 Obesity, unspecified: Secondary | ICD-10-CM | POA: Diagnosis not present

## 2022-12-14 DIAGNOSIS — Z6841 Body Mass Index (BMI) 40.0 and over, adult: Secondary | ICD-10-CM

## 2022-12-14 MED ORDER — VITAMIN D (ERGOCALCIFEROL) 1.25 MG (50000 UNIT) PO CAPS: 50000.0000 [IU] | ORAL_CAPSULE | ORAL | 0 refills | Status: DC

## 2022-12-14 MED ORDER — ATORVASTATIN CALCIUM 10 MG PO TABS: 10.0000 mg | ORAL_TABLET | Freq: Every day | ORAL | 0 refills | Status: DC

## 2022-12-14 NOTE — Progress Notes (Deleted)
Patient really enjoyed the meal plan for the last few weeks.  Her husbands birthday was [place] and her son prepared a meal and made food aligned with plan to ensure patient could follow her plan.  Denies any hunger. Was able to get all food in for each meal.  She wasn't able to get all 10oz of meat at supper.  She is trying to get used to getting all the food in and wrapping her mind around the idea of eating more to lose weight. Next few weeks she has no activities or events except occasionally on the weekends with her family. No changes need to be made to meal plan. Wondering about incorporating white rice into meal plan.  Wasn't getting snacks in frequently and if she did she would eat blueberries.

## 2022-12-23 ENCOUNTER — Ambulatory Visit (HOSPITAL_BASED_OUTPATIENT_CLINIC_OR_DEPARTMENT_OTHER): Payer: Medicare Other | Admitting: Family Medicine

## 2022-12-28 ENCOUNTER — Encounter (INDEPENDENT_AMBULATORY_CARE_PROVIDER_SITE_OTHER): Payer: Self-pay | Admitting: Family Medicine

## 2022-12-28 ENCOUNTER — Ambulatory Visit (INDEPENDENT_AMBULATORY_CARE_PROVIDER_SITE_OTHER): Payer: 59 | Admitting: Family Medicine

## 2022-12-28 VITALS — BP 142/80 | HR 73 | Temp 98.7°F | Ht 67.0 in

## 2022-12-28 DIAGNOSIS — E669 Obesity, unspecified: Secondary | ICD-10-CM

## 2022-12-28 DIAGNOSIS — Z6841 Body Mass Index (BMI) 40.0 and over, adult: Secondary | ICD-10-CM

## 2022-12-28 DIAGNOSIS — E559 Vitamin D deficiency, unspecified: Secondary | ICD-10-CM | POA: Diagnosis not present

## 2022-12-28 DIAGNOSIS — I10 Essential (primary) hypertension: Secondary | ICD-10-CM

## 2022-12-28 MED ORDER — VITAMIN D (ERGOCALCIFEROL) 1.25 MG (50000 UNIT) PO CAPS: 50000.0000 [IU] | ORAL_CAPSULE | ORAL | 0 refills | Status: DC

## 2022-12-28 NOTE — Progress Notes (Signed)
Chief Complaint:   OBESITY Meredith Wright is here to discuss her progress with her obesity treatment plan along with follow-up of her obesity related diagnoses.  Today's visit was #: 2 Starting weight: 308 lbs Starting date: 11/30/2022 Today's weight: 305 lbs Today's date: 12/14/2022 Total lbs lost to date:  3 lbs Total lbs lost since last in-office visit: 3  Interim History: Meredith Wright really enjoyed the meal plan for the last few weeks.  Her husbands birthday was [place] and her son prepared a meal and made food aligned with plan to ensure patient could follow her plan.  Denies any hunger. Was able to get all food in for each meal.  She wasn't able to get all 10oz of meat at supper.  She is trying to get used to getting all the food in and wrapping her mind around the idea of eating more to lose weight. Next few weeks she has no activities or events except occasionally on the weekends with her family. No changes need to be made to meal plan. Wondering about incorporating white rice into meal plan.  Wasn't getting snacks in frequently and if she did she would eat blueberries.  Subjective:   1. Other hyperlipidemia Labs discussed during visit today, worsening.  LDL elevated at 157, HDL 42, Trigly 236.  Not on medications but LDL has increased from 123.  10 years ASCVD risk of 10.5% (Optimal 1%)-mod intensity statin recommend.  2. Prediabetes Labs discussed during visit today.  A1c 6.1, insulin 13.7.  Not on medications.  3. Vitamin D deficiency Labs discussed during visit today.  Not on Vit D.  Notes fatigue.  Vit D level of 11.7.  Assessment/Plan:   1. Other hyperlipidemia Will Start Lipitor 10 mg by mouth daily for 3 months with 0 refills.  -Start atorvastatin (LIPITOR) 10 MG tablet; Take 1 tablet (10 mg total) by mouth daily.  Dispense: 90 tablet; Refill: 0  2. Prediabetes Pathophysiology of IR, Prediabetes and diabetes discussed.  No medications at this time--follow up labs in 3  months.  3. Vitamin D deficiency Will Start Vit D 50K IU once a week for 1 month with 0 refills.  -Start Vitamin D, Ergocalciferol, (DRISDOL) 1.25 MG (50000 UNIT) CAPS capsule; Take 1 capsule (50,000 Units total) by mouth every 7 (seven) days.  Dispense: 4 capsule; Refill: 0  5. BMI 45.0-49.9, adult ([place])  6. Obesity with starting BMI of 48.4 Meredith Wright is currently in the action stage of change. As such, her goal is to continue with weight loss efforts. She has agreed to the Category 4 Plan.   Exercise goals: No exercise has been prescribed at this time.  Behavioral modification strategies: increasing lean protein intake, meal planning and cooking strategies, keeping healthy foods in the home, and planning for success.  Meredith Wright has agreed to follow-up with our clinic in 2 weeks. She was informed of the importance of frequent follow-up visits to maximize her success with intensive lifestyle modifications for her multiple health conditions.   Objective:   Blood pressure (!) 148/82, pulse 62, temperature 98.4 F (36.9 C), height '5\' 7"'$  (1.702 m), SpO2 98 %. Body mass index is 49.24 kg/m.  General: Cooperative, alert, well developed, in no acute distress. HEENT: Conjunctivae and lids unremarkable. Cardiovascular: Regular rhythm.  Lungs: Normal work of breathing. Neurologic: No focal deficits.   Lab Results  Component Value Date   CREATININE 0.81 11/30/2022   BUN 17 11/30/2022   NA 142 11/30/2022   K  4.0 11/30/2022   CL 103 11/30/2022   CO2 21 11/30/2022   Lab Results  Component Value Date   ALT 22 11/30/2022   AST 16 11/30/2022   ALKPHOS 142 (H) 11/30/2022   BILITOT 0.4 11/30/2022   Lab Results  Component Value Date   HGBA1C 6.1 (H) 11/30/2022   Lab Results  Component Value Date   INSULIN 13.7 11/30/2022   Lab Results  Component Value Date   TSH 1.520 11/30/2022   Lab Results  Component Value Date   CHOL 243 (H) 11/30/2022   HDL 42 11/30/2022   LDLCALC 157 (H)  11/30/2022   TRIG 236 (H) 11/30/2022   CHOLHDL 5.4 (H) 06/13/2022   Lab Results  Component Value Date   VD25OH 11.7 (L) 11/30/2022   Lab Results  Component Value Date   WBC 12.4 (H) 11/30/2022   HGB 13.3 11/30/2022   HCT 40.3 11/30/2022   MCV 83 11/30/2022   PLT 364 11/30/2022   Lab Results  Component Value Date   IRON 43 06/06/2013   TIBC 328 06/06/2013   Attestation Statements:   Reviewed by clinician on day of visit: allergies, medications, problem list, medical history, surgical history, family history, social history, and previous encounter notes.  Time spent on visit including pre-visit chart review and post-visit care and charting was 45 minutes.   I, Elnora Morrison, RMA am acting as transcriptionist for Coralie Common, MD. I have reviewed the above documentation for accuracy and completeness, and I agree with the above. - Coralie Common, MD

## 2022-12-28 NOTE — Progress Notes (Deleted)
Patient asking if her daughter can come to this appointment.  Patient questions patients metabolism.  Patient mentions that her MS is acting up.  She is bringing her meal plan sheet with her to restaurants and ensures she is eating on plan.  Has been eating on plan and denies any hunger.  She has date nights with her husband and does sometimes have to throw food away.  She is able to get all food in and does occasionally have to sit longer to get all food in.

## 2023-01-01 ENCOUNTER — Other Ambulatory Visit (INDEPENDENT_AMBULATORY_CARE_PROVIDER_SITE_OTHER): Payer: Self-pay | Admitting: Family Medicine

## 2023-01-01 DIAGNOSIS — E559 Vitamin D deficiency, unspecified: Secondary | ICD-10-CM

## 2023-01-03 ENCOUNTER — Encounter (HOSPITAL_BASED_OUTPATIENT_CLINIC_OR_DEPARTMENT_OTHER): Payer: Self-pay

## 2023-01-03 ENCOUNTER — Ambulatory Visit (INDEPENDENT_AMBULATORY_CARE_PROVIDER_SITE_OTHER): Payer: 59

## 2023-01-03 VITALS — Ht 67.0 in | Wt 308.0 lb

## 2023-01-03 DIAGNOSIS — Z0000 Encounter for general adult medical examination without abnormal findings: Secondary | ICD-10-CM

## 2023-01-03 NOTE — Progress Notes (Addendum)
Subjective:   Meredith Wright is a 52 y.o. female who presents for Medicare Annual (Subsequent) preventive examination.  Review of Systems    Virtual Visit via Telephone Note  I connected with  Meredith Wright on 01/03/23 at  2:00 PM EST by telephone and verified that I am speaking with the correct person using two identifiers.  Location: Patient: Home Provider: Office Persons participating in the virtual visit: patient/Nurse Health Advisor   I discussed the limitations, risks, security and privacy concerns of performing an evaluation and management service by telephone and the availability of in person appointments. The patient expressed understanding and agreed to proceed.  Interactive audio and video telecommunications were attempted between this nurse and patient, however failed, due to patient having technical difficulties OR patient did not have access to video capability.  We continued and completed visit with audio only.  Some vital signs may be absent or patient reported.   Meredith Peaches, LPN  Cardiac Risk Factors include: advanced age (>38mn, >>90women);hypertension     Objective:    Today's Vitals   01/03/23 1350  Weight: (!) 308 lb (139.7 kg)  Height: '5\' 7"'$  (1.702 m)   Body mass index is 48.24 kg/m.     01/03/2023    2:02 PM 05/11/2021    9:41 AM 12/20/2015    5:45 PM  Advanced Directives  Does Patient Have a Medical Advance Directive? No No No  Would patient like information on creating a medical advance directive? No - Patient declined Yes (MAU/Ambulatory/Procedural Areas - Information given) No - patient declined information    Current Medications (verified) Outpatient Encounter Medications as of 01/03/2023  Medication Sig   amLODipine (NORVASC) 5 MG tablet TAKE 1 TABLET (5 MG TOTAL) BY MOUTH DAILY.   atorvastatin (LIPITOR) 10 MG tablet Take 1 tablet (10 mg total) by mouth daily.   diclofenac (VOLTAREN) 75 MG EC tablet TAKE 1 TABLET BY MOUTH TWICE A DAY    gabapentin (NEURONTIN) 300 MG capsule Take 1 capsule by mouth 4 (four) times daily.   levETIRAcetam (KEPPRA) 500 MG tablet Take 500 mg by mouth 2 (two) times daily.   Multiple Vitamin (MULTIVITAMIN) tablet Take 1 tablet by mouth daily.   ocrelizumab (OCREVUS) 300 MG/10ML injection Inject 1 Dose into the vein every 6 (six) months.   omeprazole (PRILOSEC) 20 MG capsule Take 20 mg by mouth daily.   solifenacin (VESICARE) 5 MG tablet Take 1 tablet by mouth in the morning and at bedtime.   valsartan (DIOVAN) 160 MG tablet Take 1 tablet (160 mg total) by mouth daily.   Vitamin D, Ergocalciferol, (DRISDOL) 1.25 MG (50000 UNIT) CAPS capsule Take 1 capsule (50,000 Units total) by mouth every 7 (seven) days.   No facility-administered encounter medications on file as of 01/03/2023.    Allergies (verified) Lactose intolerance (gi)   History: Past Medical History:  Diagnosis Date   Arthritis    Joint pain    Multiple food allergies    Multiple sclerosis (HCC)    Sickle cell anemia (HCC)    trait   Past Surgical History:  Procedure Laterality Date   BRAIN SURGERY  11/07/1996   Biopsy -Dx MS   CESAREAN SECTION     Family History  Problem Relation Age of Onset   Heart disease Mother        murmur   Heart disease Daughter        murmur   Heart disease Maternal Grandmother  Diabetes Maternal Grandmother    Social History   Socioeconomic History   Marital status: Married    Spouse name: Not on file   Number of children: Not on file   Years of education: Not on file   Highest education level: Not on file  Occupational History   Occupation: Stay at home  Tobacco Use   Smoking status: Never    Passive exposure: Never   Smokeless tobacco: Never  Substance and Sexual Activity   Alcohol use: Never   Drug use: Never   Sexual activity: Yes  Other Topics Concern   Not on file  Social History Narrative   On Disability Secondary To Multiple Sclerosis   Does not work sec to this       Married. 2 children.   Son: 80 y/o   Daughter: 57 y/o   Social Determinants of Radio broadcast assistant Strain: Low Risk  (01/03/2023)   Overall Financial Resource Strain (CARDIA)    Difficulty of Paying Living Expenses: Not hard at all  Food Insecurity: No Food Insecurity (01/03/2023)   Hunger Vital Sign    Worried About Running Out of Food in the Last Year: Never true    Ran Out of Food in the Last Year: Never true  Transportation Needs: No Transportation Needs (01/03/2023)   PRAPARE - Hydrologist (Medical): No    Lack of Transportation (Non-Medical): No  Physical Activity: Sufficiently Active (01/03/2023)   Exercise Vital Sign    Days of Exercise per Week: 5 days    Minutes of Exercise per Session: 30 min  Stress: No Stress Concern Present (01/03/2023)   [place]    Feeling of Stress : Not at all  Social Connections: Moderately Isolated (01/03/2023)   Social Connection and Isolation Panel [NHANES]    Frequency of Communication with Friends and Family: More than three times a week    Frequency of Social Gatherings with Friends and Family: More than three times a week    Attends Religious Services: Never    Marine scientist or Organizations: No    Attends Music therapist: Never    Marital Status: Married    Tobacco Counseling Counseling given: Not Answered   Clinical Intake:  Pre-visit preparation completed: Yes  Pain : No/denies pain     BMI - recorded: 48.24 Nutritional Status: BMI > 30  Obese Nutritional Risks: None Diabetes: No  How often do you need to have someone help you when you read instructions, pamphlets, or other written materials from your doctor or pharmacy?: 1 - Never  Diabetic?  No  Interpreter Needed?: No  Information entered by :: Rolene Arbour LPN   Activities of Daily Living    01/03/2023    2:01 PM 01/02/2023     7:31 PM  In your present state of health, do you have any difficulty performing the following activities:  Hearing? 0 0  Vision? 0 0  Difficulty concentrating or making decisions? 0 0  Walking or climbing stairs? 0 1  Dressing or bathing? 0 0  Doing errands, shopping? 0 0  Preparing Food and eating ? N N  Using the Toilet? N N  In the past six months, have you accidently leaked urine? N N  Do you have problems with loss of bowel control? N N  Managing your Medications? N N  Managing your Finances? N N  Housekeeping or managing  your Housekeeping? N N    Patient Care Team: [place], Blondell Reveal, MD as PCP - General (Family Medicine)  Indicate any recent Medical Services you may have received from other than [facility] providers in the past year (date may be approximate).     Assessment:   This is a routine wellness examination for Meredith Wright.  Hearing/Vision screen Hearing Screening - Comments:: Denies hearing difficulties   Vision Screening - Comments:: Wears rx glasses - up to date with routine eye exams with  [facility]  Dietary issues and exercise activities discussed: Exercise limited by: None identified   Goals Addressed               This Visit's Progress     Patient stated (pt-stated)        Just take things one day at a time.       Depression Screen    01/03/2023    2:00 PM 01/03/2023    1:59 PM 09/22/2022    9:57 AM 06/22/2022    2:17 PM 04/13/2022   10:10 AM 03/22/2022    4:11 PM 04/23/2021   10:38 AM  PHQ 2/9 Scores  PHQ - 2 Score 0 0 0 0 0 0 0  PHQ- 9 Score   0 0 0    Exception Documentation   Medical reason Medical reason Medical reason Medical reason     Fall Risk    01/03/2023    2:01 PM 01/02/2023    7:31 PM 09/22/2022    9:57 AM 06/22/2022    2:12 PM 05/25/2022    2:14 PM  [place] in the past year? 0 0 0 0 0  Number falls in past yr: 0 0 0 0 0  Injury with Fall? 0 0 0 0 0  Risk for fall due to : No Fall Risks  No Fall Risks No Fall Risks No  Fall Risks  Follow up Falls prevention discussed  Falls evaluation completed Falls evaluation completed Falls evaluation completed    [place]:  Any stairs in or around the home? Yes  If so, are there any without handrails? No  Home free of loose throw rugs in walkways, pet beds, electrical cords, etc? Yes  Adequate lighting in your home to reduce risk of falls? Yes   ASSISTIVE DEVICES UTILIZED TO PREVENT FALLS:  Life alert? No  Use of a cane, walker or w/c? No  Grab bars in the bathroom? Yes  Shower chair or bench in shower? No  Elevated toilet seat or a handicapped toilet? No   TIMED UP AND GO:  Was the test performed? No . Audio Visit   Cognitive Function:        01/03/2023    2:03 PM  6CIT Screen  What Year? 0 points  What month? 0 points  What time? 0 points  Count back from 20 0 points  Months in reverse 0 points  Repeat phrase 0 points  Total Score 0 points    Immunizations Immunization History  Administered Date(s) Administered   Influenza Split 02/05/2014   Influenza,inj,Quad PF,6+ Mos 09/22/2022   Influenza,inj,quad, With Preservative 11/11/2017   Influenza-Unspecified 02/05/2014, 11/11/2017, 09/29/2021   Moderna Covid-19 Vaccine Bivalent Booster 33yr & up 01/11/2022   Moderna Sars-Covid-2 Vaccination 01/23/2020, 02/21/2020, 10/17/2020, 04/15/2021   Tdap 06/06/2013, 02/05/2014    TDAP status: Up to date  Flu Vaccine status: Up to date    Covid-19 vaccine status: Completed  vaccines  Qualifies for Shingles Vaccine? Yes   Zostavax completed No   Shingrix Completed?: No.    Education has been provided regarding the importance of this vaccine. Patient has been advised to call insurance company to determine out of pocket expense if they have not yet received this vaccine. Advised may also receive vaccine at local pharmacy or Health Dept. Verbalized acceptance and understanding.  Screening Tests Health Maintenance   Topic Date Due   COVID-19 Vaccine (6 - 2023-24 season) 01/19/2023 (Originally 07/08/2022)   Zoster Vaccines- Shingrix (1 of 2) 04/03/2023 (Originally 01/22/1991)   COLONOSCOPY (Pts 45-60yr Insurance coverage will need to be confirmed)  01/04/2024 (Originally 01/21/2017)   Hepatitis C Screening  01/04/2024 (Originally 01/21/1990)   HIV Screening  01/04/2024 (Originally 01/22/1987)   MAMMOGRAM  12/29/2023   Medicare Annual Wellness (AWV)  01/04/2024   DTaP/Tdap/Td (3 - Td or Tdap) 02/06/2024   PAP SMEAR-Modifier  04/23/2024   INFLUENZA VACCINE  Completed   HPV VACCINES  Aged Out    Health Maintenance  There are no preventive care reminders to display for this patient.     Mammogram status: Completed 12/28/21. Repeat every year    Lung Cancer Screening: (Low Dose CT Chest recommended if Age 51-80years, 30 pack-year currently smoking OR have quit w/in 15years.) does not qualify.     Additional Screening:  Hepatitis C Screening: does qualify; Completed Deferred  Vision Screening: Recommended annual ophthalmology exams for early detection of glaucoma and other disorders of the eye. Is the patient up to date with their annual eye exam?  Yes  Who is the provider or what is the name of the office in which the patient attends annual eye exams? [facility] If pt is not established with a provider, would they like to be referred to a provider to establish care? No .   Dental Screening: Recommended annual dental exams for proper oral hygiene  Community Resource Referral / Chronic Care Management:  CRR required this visit?  No   CCM required this visit?  No      Plan:     I have personally reviewed and noted the following in the patient's chart:   Medical and social history Use of alcohol, tobacco or illicit drugs  Current medications and supplements including opioid prescriptions. Patient is not currently taking opioid prescriptions. Functional ability and status Nutritional  status Physical activity Advanced directives List of other physicians Hospitalizations, surgeries, and ER visits in previous 12 months Vitals Screenings to include cognitive, depression, and falls Referrals and appointments  In addition, I have reviewed and discussed with patient certain preventive protocols, quality metrics, and best practice recommendations. A written personalized care plan for preventive services as well as general preventive health recommendations were provided to patient.     BCriselda Peaches LPN   [id number]  Nurse Notes: Patient due Hep-C and HIV Screening

## 2023-01-03 NOTE — Progress Notes (Signed)
Chief Complaint:   OBESITY Meredith Wright is here to discuss her progress with her obesity treatment plan along with follow-up of her obesity related diagnoses. Meredith Wright is on the Category 4 Plan and states she is following her eating plan approximately 100% of the time. Meredith Wright states she is exercising 45-60 minutes 7 times per week.  Today's visit was #: 3 Starting weight: 308 lbs Starting date: 11/30/2022 Today's weight: 303 lbs Today's date: 12/28/2022 Total lbs lost to date: 5 lbs Total lbs lost since last in-office visit: 2  Interim History:Meredith Wright is asking if her daughter can come to this appointment.  Patient questions patients metabolism.  Patient mentions that her MS is acting up.  She is bringing her meal plan sheet with her to restaurants and ensures she is eating on plan.  Has been eating on plan and denies any hunger.  She has date nights with her husband and does sometimes have to throw food away.  She is able to get all food in and does occasionally have to sit longer to get all food in.  Subjective:   1. Essential hypertension Blood pressure slightly elevated today as it has been for the last few appointments.  Denies chest pain, chest pressure and headache.  On Amlodipine and Diovan.  2. Vitamin D deficiency Meredith Wright is currently taking prescription Vit D 50,000 IU once a week.   Denies any nausea, vomiting or muscle weakness.  She notes fatigue.  Assessment/Plan:   1. Essential hypertension Continue with current medications.  May need to increase amlodipine if blood pressure still elevated at next appointment.  2. Vitamin D deficiency We will refill Vit D 50K IU once a week for 1 month with 0 refills.  -Refill Vitamin D, Ergocalciferol, (DRISDOL) 1.25 MG (50000 UNIT) CAPS capsule; Take 1 capsule (50,000 Units total) by mouth every 7 (seven) days.  Dispense: 4 capsule; Refill: 0  3. BMI 45.0-49.9, adult ([place])  4. Obesity with starting BMI of 48.4 Meredith Wright is currently in the action  stage of change. As such, her goal is to continue with weight loss efforts. She has agreed to the Category 4 Plan and keeping a food journal and adhering to recommended goals of 550-650 calories and 50+ grams of protein for supper.   Exercise goals: All adults should avoid inactivity. Some physical activity is better than none, and adults who participate in any amount of physical activity gain some health benefits.  Behavioral modification strategies: increasing lean protein intake, meal planning and cooking strategies, keeping healthy foods in the home, and planning for success.  Meredith Wright has agreed to follow-up with our clinic in 2 weeks. She was informed of the importance of frequent follow-up visits to maximize her success with intensive lifestyle modifications for her multiple health conditions.   Objective:   Blood pressure (!) 142/80, pulse 73, temperature 98.7 F (37.1 C), height '5\' 7"'$  (1.702 m), SpO2 99 %. Body mass index is 49.24 kg/m.  General: Cooperative, alert, well developed, in no acute distress. HEENT: Conjunctivae and lids unremarkable. Cardiovascular: Regular rhythm.  Lungs: Normal work of breathing. Neurologic: No focal deficits.   Lab Results  Component Value Date   CREATININE 0.81 11/30/2022   BUN 17 11/30/2022   NA 142 11/30/2022   K 4.0 11/30/2022   CL 103 11/30/2022   CO2 21 11/30/2022   Lab Results  Component Value Date   ALT 22 11/30/2022   AST 16 11/30/2022   ALKPHOS 142 (H) 11/30/2022  BILITOT 0.4 11/30/2022   Lab Results  Component Value Date   HGBA1C 6.1 (H) 11/30/2022   Lab Results  Component Value Date   INSULIN 13.7 11/30/2022   Lab Results  Component Value Date   TSH 1.520 11/30/2022   Lab Results  Component Value Date   CHOL 243 (H) 11/30/2022   HDL 42 11/30/2022   LDLCALC 157 (H) 11/30/2022   TRIG 236 (H) 11/30/2022   CHOLHDL 5.4 (H) 06/13/2022   Lab Results  Component Value Date   VD25OH 11.7 (L) 11/30/2022   Lab Results   Component Value Date   WBC 12.4 (H) 11/30/2022   HGB 13.3 11/30/2022   HCT 40.3 11/30/2022   MCV 83 11/30/2022   PLT 364 11/30/2022   Lab Results  Component Value Date   IRON 43 06/06/2013   TIBC 328 06/06/2013   Attestation Statements:   Reviewed by clinician on day of visit: allergies, medications, problem list, medical history, surgical history, family history, social history, and previous encounter notes.  I, Elnora Morrison, RMA am acting as transcriptionist for Coralie Common, MD.  I have reviewed the above documentation for accuracy and completeness, and I agree with the above. - Coralie Common, MD

## 2023-01-03 NOTE — Patient Instructions (Addendum)
Meredith Wright , Thank you for taking time to come for your Medicare Wellness Visit. I appreciate your ongoing commitment to your health goals. Please review the following plan we discussed and let me know if I can assist you in the future.   These are the goals we discussed:  Goals       Patient stated (pt-stated)      Just take things one day at a time.        This is a list of the screening recommended for you and due dates:  Health Maintenance  Topic Date Due   COVID-19 Vaccine (6 - 2023-24 season) 01/19/2023*   Zoster (Shingles) Vaccine (1 of 2) 04/03/2023*   Colon Cancer Screening  01/04/2024*   Hepatitis C Screening: USPSTF Recommendation to screen - Ages 18-79 yo.  01/04/2024*   HIV Screening  01/04/2024*   Mammogram  12/29/2023   Medicare Annual Wellness Visit  01/04/2024   DTaP/Tdap/Td vaccine (3 - Td or Tdap) 02/06/2024   Pap Smear  04/23/2024   Flu Shot  Completed   HPV Vaccine  Aged Out  *Topic was postponed. The date shown is not the original due date.    Advanced directives: Advance directive discussed with you today. Even though you declined this today, please call our office should you change your mind, and we can give you the proper paperwork for you to fill out.   Conditions/risks identified: None  Next appointment: Follow up in one year for your annual wellness visit.    Preventive Care 40-64 Years, Female Preventive care refers to lifestyle choices and visits with your health care provider that can promote health and wellness. What does preventive care include? A yearly physical exam. This is also called an annual well check. Dental exams once or twice a year. Routine eye exams. Ask your health care provider how often you should have your eyes checked. Personal lifestyle choices, including: Daily care of your teeth and gums. Regular physical activity. Eating a healthy diet. Avoiding tobacco and drug use. Limiting alcohol use. Practicing safe  sex. Taking low-dose aspirin daily starting at age 48. Taking vitamin and mineral supplements as recommended by your health care provider. What happens during an annual well check? The services and screenings done by your health care provider during your annual well check will depend on your age, overall health, lifestyle risk factors, and family history of disease. Counseling  Your health care provider may ask you questions about your: Alcohol use. Tobacco use. Drug use. Emotional well-being. Home and relationship well-being. Sexual activity. Eating habits. Work and work Statistician. Method of birth control. Menstrual cycle. Pregnancy history. Screening  You may have the following tests or measurements: Height, weight, and BMI. Blood pressure. Lipid and cholesterol levels. These may be checked every 5 years, or more frequently if you are over 4 years old. Skin check. Lung cancer screening. You may have this screening every year starting at age 54 if you have a 30-pack-year history of smoking and currently smoke or have quit within the past 15 years. Fecal occult blood test (FOBT) of the stool. You may have this test every year starting at age 69. Flexible sigmoidoscopy or colonoscopy. You may have a sigmoidoscopy every 5 years or a colonoscopy every 10 years starting at age 13. Hepatitis C blood test. Hepatitis B blood test. Sexually transmitted disease (STD) testing. Diabetes screening. This is done by checking your blood sugar (glucose) after you have not eaten for a while (fasting).  You may have this done every 1-3 years. Mammogram. This may be done every 1-2 years. Talk to your health care provider about when you should start having regular mammograms. This may depend on whether you have a family history of breast cancer. BRCA-related cancer screening. This may be done if you have a family history of breast, ovarian, tubal, or peritoneal cancers. Pelvic exam and Pap test. This  may be done every 3 years starting at age 33. Starting at age 25, this may be done every 5 years if you have a Pap test in combination with an HPV test. Bone density scan. This is done to screen for osteoporosis. You may have this scan if you are at high risk for osteoporosis. Discuss your test results, treatment options, and if necessary, the need for more tests with your health care provider. Vaccines  Your health care provider may recommend certain vaccines, such as: Influenza vaccine. This is recommended every year. Tetanus, diphtheria, and acellular pertussis (Tdap, Td) vaccine. You may need a Td booster every 10 years. Zoster vaccine. You may need this after age 52. Pneumococcal 13-valent conjugate (PCV13) vaccine. You may need this if you have certain conditions and were not previously vaccinated. Pneumococcal polysaccharide (PPSV23) vaccine. You may need one or two doses if you smoke cigarettes or if you have certain conditions. Talk to your health care provider about which screenings and vaccines you need and how often you need them. This information is not intended to replace advice given to you by your health care provider. Make sure you discuss any questions you have with your health care provider. Document Released: 11/20/2015 Document Revised: 07/13/2016 Document Reviewed: 08/25/2015 Elsevier Interactive Patient Education  2017 [place] Prevention in the Home Falls can cause injuries. They can happen to people of all ages. There are many things you can do to make your home safe and to help prevent falls. What can I do on the outside of my home? Regularly fix the edges of walkways and driveways and fix any cracks. Remove anything that might make you trip as you walk through a door, such as a raised step or threshold. Trim any bushes or trees on the path to your home. Use bright outdoor lighting. Clear any walking paths of anything that might make someone trip, such  as rocks or tools. Regularly check to see if handrails are loose or broken. Make sure that both sides of any steps have handrails. Any raised decks and porches should have guardrails on the edges. Have any leaves, snow, or ice cleared regularly. Use sand or salt on walking paths during winter. Clean up any spills in your garage right away. This includes oil or grease spills. What can I do in the bathroom? Use night lights. Install grab bars by the toilet and in the tub and shower. Do not use towel bars as grab bars. Use non-skid mats or decals in the tub or shower. If you need to sit down in the shower, use a plastic, non-slip stool. Keep the floor dry. Clean up any water that spills on the floor as soon as it happens. Remove soap buildup in the tub or shower regularly. Attach bath mats securely with double-sided non-slip rug tape. Do not have throw rugs and other things on the floor that can make you trip. What can I do in the bedroom? Use night lights. Make sure that you have a light by your bed that is easy to reach.  Do not use any sheets or blankets that are too big for your bed. They should not hang down onto the floor. Have a firm chair that has side arms. You can use this for support while you get dressed. Do not have throw rugs and other things on the floor that can make you trip. What can I do in the kitchen? Clean up any spills right away. Avoid walking on wet floors. Keep items that you use a lot in easy-to-reach places. If you need to reach something above you, use a strong step stool that has a grab bar. Keep electrical cords out of the way. Do not use floor polish or wax that makes floors slippery. If you must use wax, use non-skid floor wax. Do not have throw rugs and other things on the floor that can make you trip. What can I do with my stairs? Do not leave any items on the stairs. Make sure that there are handrails on both sides of the stairs and use them. Fix  handrails that are broken or loose. Make sure that handrails are as long as the stairways. Check any carpeting to make sure that it is firmly attached to the stairs. Fix any carpet that is loose or worn. Avoid having throw rugs at the top or bottom of the stairs. If you do have throw rugs, attach them to the floor with carpet tape. Make sure that you have a light switch at the top of the stairs and the bottom of the stairs. If you do not have them, ask someone to add them for you. What else can I do to help prevent falls? Wear shoes that: Do not have high heels. Have rubber bottoms. Are comfortable and fit you well. Are closed at the toe. Do not wear sandals. If you use a stepladder: Make sure that it is fully opened. Do not climb a closed stepladder. Make sure that both sides of the stepladder are locked into place. Ask someone to hold it for you, if possible. Clearly mark and make sure that you can see: Any grab bars or handrails. First and last steps. Where the edge of each step is. Use tools that help you move around (mobility aids) if they are needed. These include: Canes. Walkers. Scooters. Crutches. Turn on the lights when you go into a dark area. Replace any light bulbs as soon as they burn out. Set up your furniture so you have a clear path. Avoid moving your furniture around. If any of your floors are uneven, fix them. If there are any pets around you, be aware of where they are. Review your medicines with your doctor. Some medicines can make you feel dizzy. This can increase your chance of falling. Ask your doctor what other things that you can do to help prevent falls. This information is not intended to replace advice given to you by your health care provider. Make sure you discuss any questions you have with your health care provider. Document Released: 08/20/2009 Document Revised: 03/31/2016 Document Reviewed: 11/28/2014 Elsevier Interactive Patient Education  2017  [employer].

## 2023-01-09 ENCOUNTER — Ambulatory Visit (INDEPENDENT_AMBULATORY_CARE_PROVIDER_SITE_OTHER): Payer: 59 | Admitting: Family Medicine

## 2023-01-09 ENCOUNTER — Encounter (HOSPITAL_BASED_OUTPATIENT_CLINIC_OR_DEPARTMENT_OTHER): Payer: Self-pay | Admitting: Family Medicine

## 2023-01-09 ENCOUNTER — Ambulatory Visit (HOSPITAL_BASED_OUTPATIENT_CLINIC_OR_DEPARTMENT_OTHER): Payer: Medicare Other | Admitting: Family Medicine

## 2023-01-09 VITALS — BP 138/82 | HR 86 | Ht 67.0 in | Wt 303.0 lb

## 2023-01-09 DIAGNOSIS — I10 Essential (primary) hypertension: Secondary | ICD-10-CM | POA: Diagnosis not present

## 2023-01-09 NOTE — Assessment & Plan Note (Signed)
Blood pressure borderline in office today on initial reading.  She indicates that she has had better control of blood pressure on home readings.  She will typically have improved readings on days when she is able to exercise.  She has not exercised today.  She continues with amlodipine and valsartan.  Denies any issues with lower extremity edema, chest pain or headaches. We discussed options today and we will plan to continue with current medication regimen, no changes to be made today.  Recommend intermittent monitoring of blood pressure at home.  She will return for nurse visit later this month for recheck of blood pressure.  Will plan for follow-up office visit in about 2 to 3 months. We discussed potential management considerations including increasing dose of valsartan, addition of third antihypertensive.  If blood pressure continues to be borderline in the office, would plan to adjust medication.  He will either increase valsartan to 320 mg daily or add hydrochlorothiazide.

## 2023-01-09 NOTE — Progress Notes (Signed)
    Procedures performed today:    None.  Independent interpretation of notes and tests performed by another provider:   None.  Brief History, Exam, Impression, and Recommendations:    BP 138/82   Pulse 86   Ht '5\' 7"'$  (1.702 m)   Wt (!) 303 lb (137.4 kg)   SpO2 100%   BMI 47.46 kg/m   Hypertension Blood pressure borderline in office today on initial reading.  She indicates that she has had better control of blood pressure on home readings.  She will typically have improved readings on days when she is able to exercise.  She has not exercised today.  She continues with amlodipine and valsartan.  Denies any issues with lower extremity edema, chest pain or headaches. We discussed options today and we will plan to continue with current medication regimen, no changes to be made today.  Recommend intermittent monitoring of blood pressure at home.  She will return for nurse visit later this month for recheck of blood pressure.  Will plan for follow-up office visit in about 2 to 3 months. We discussed potential management considerations including increasing dose of valsartan, addition of third antihypertensive.  If blood pressure continues to be borderline in the office, would plan to adjust medication.  He will either increase valsartan to 320 mg daily or add hydrochlorothiazide.  Return in about 3 months (around 04/11/2023) for HTN.   ___________________________________________ Aijalon Kirtz de [place], MD, ABFM, [facility] and [place]

## 2023-01-10 ENCOUNTER — Ambulatory Visit (HOSPITAL_BASED_OUTPATIENT_CLINIC_OR_DEPARTMENT_OTHER): Payer: Medicare Other

## 2023-01-11 ENCOUNTER — Encounter (INDEPENDENT_AMBULATORY_CARE_PROVIDER_SITE_OTHER): Payer: Self-pay | Admitting: Family Medicine

## 2023-01-11 ENCOUNTER — Ambulatory Visit (INDEPENDENT_AMBULATORY_CARE_PROVIDER_SITE_OTHER): Payer: 59 | Admitting: Family Medicine

## 2023-01-11 VITALS — BP 138/66 | HR 79 | Temp 98.4°F | Ht 67.0 in | Wt 301.0 lb

## 2023-01-11 DIAGNOSIS — E559 Vitamin D deficiency, unspecified: Secondary | ICD-10-CM

## 2023-01-11 DIAGNOSIS — E7849 Other hyperlipidemia: Secondary | ICD-10-CM

## 2023-01-11 DIAGNOSIS — E669 Obesity, unspecified: Secondary | ICD-10-CM | POA: Diagnosis not present

## 2023-01-11 DIAGNOSIS — Z6841 Body Mass Index (BMI) 40.0 and over, adult: Secondary | ICD-10-CM | POA: Diagnosis not present

## 2023-01-11 MED ORDER — VITAMIN D (ERGOCALCIFEROL) 1.25 MG (50000 UNIT) PO CAPS: 50000.0000 [IU] | ORAL_CAPSULE | ORAL | 0 refills | Status: DC

## 2023-01-11 NOTE — Progress Notes (Deleted)
Patient and her husband are both present today. She saw her PCP 2 days ago.  She has been taking care of her husband and this made it difficult to follow plan.  She followed breakfast and supper but lunch has been difficult to get all the food in due to her husband needing care.  She hasn't been working out either.  She wants to continue Cat 4 for the next few weeks.  Her husband is going back to work in the next few days and she anticipating being able to get more on track.  Patient's birthday is [birth year] and she has no idea what is planned for her.

## 2023-01-19 NOTE — Progress Notes (Signed)
Chief Complaint:   OBESITY Meredith Wright is here to discuss her progress with her obesity treatment plan along with follow-up of her obesity related diagnoses. Meredith Wright is on the Category 4 plan and journaling dinner with goal of 550-650 calories and 50+ grams of protein and states she is following her eating plan approximately 50% of the time. Meredith Wright states she has not been exercising.  Today's visit was #: 4 Starting weight: 308 lbs Starting date: 11/30/22 Today's weight: 301 lbs Today's date: 01/11/23 Total lbs lost to date: 7 Total lbs lost since last in-office visit: -2  Interim History:  Patient and her husband are both present today. She saw her PCP 2 days ago.  She has been taking care of her husband and this made it difficult to follow plan.  She followed breakfast and supper but lunch has been difficult to get all the food in due to her husband needing care.  She hasn't been working out either.  She wants to continue Cat 4 for the next few weeks.  Her husband is going back to work in the next few days and she anticipating being able to get more on track.  Patient's birthday is [birth year] and she has no idea what is planned for her.   Subjective:   1. Vitamin D deficiency No nausea, vomiting, muscle weakness. Positive for fatigue.  Last vitamin D level 11.7.  2. Other hyperlipidemia Last LDL 157, HDL 42, triglycerides 236. On Lipitor.   Assessment/Plan:   1. Vitamin D deficiency Refill: - Vitamin D, Ergocalciferol, (DRISDOL) 1.25 MG (50000 UNIT) CAPS capsule; Take 1 capsule (50,000 Units total) by mouth every 7 (seven) days.  Dispense: 4 capsule; Refill: 0  2. Other hyperlipidemia Continue Lipitor.  No change in med or dosage.  3. BMI 45.0-49.9, adult ([place]) Obesity with starting BMI of 48.4 Meredith Wright is currently in the action stage of change. As such, her goal is to continue with weight loss efforts. She has agreed to the Category 4 plan and journaling dinner with goal of 550-650  calories and 50+ grams of protein.  Exercise goals: All adults should avoid inactivity. Some physical activity is better than none, and adults who participate in any amount of physical activity gain some health benefits.  Behavioral modification strategies: increasing lean protein intake, meal planning and cooking strategies, keeping healthy foods in the home, and planning for success.  Meredith Wright has agreed to follow-up with our clinic in 3 weeks. She was informed of the importance of frequent follow-up visits to maximize her success with intensive lifestyle modifications for her multiple health conditions.   Objective:   Blood pressure 138/66, pulse 79, temperature 98.4 F (36.9 C), height '5\' 7"'$  (1.702 m), weight (!) 301 lb (136.5 kg), SpO2 96 %. Body mass index is 47.14 kg/m.  General: Cooperative, alert, well developed, in no acute distress. HEENT: Conjunctivae and lids unremarkable. Cardiovascular: Regular rhythm.  Lungs: Normal work of breathing. Neurologic: No focal deficits.   Lab Results  Component Value Date   CREATININE 0.81 11/30/2022   BUN 17 11/30/2022   NA 142 11/30/2022   K 4.0 11/30/2022   CL 103 11/30/2022   CO2 21 11/30/2022   Lab Results  Component Value Date   ALT 22 11/30/2022   AST 16 11/30/2022   ALKPHOS 142 (H) 11/30/2022   BILITOT 0.4 11/30/2022   Lab Results  Component Value Date   HGBA1C 6.1 (H) 11/30/2022   Lab Results  Component Value Date  INSULIN 13.7 11/30/2022   Lab Results  Component Value Date   TSH 1.520 11/30/2022   Lab Results  Component Value Date   CHOL 243 (H) 11/30/2022   HDL 42 11/30/2022   LDLCALC 157 (H) 11/30/2022   TRIG 236 (H) 11/30/2022   CHOLHDL 5.4 (H) 06/13/2022   Lab Results  Component Value Date   VD25OH 11.7 (L) 11/30/2022   Lab Results  Component Value Date   WBC 12.4 (H) 11/30/2022   HGB 13.3 11/30/2022   HCT 40.3 11/30/2022   MCV 83 11/30/2022   PLT 364 11/30/2022   Lab Results  Component Value  Date   IRON 43 06/06/2013   TIBC 328 06/06/2013    Attestation Statements:   Reviewed by clinician on day of visit: allergies, medications, problem list, medical history, surgical history, family history, social history, and previous encounter notes.  I, Dawn Whitmire, FNP-C, am acting as Location manager for Coralie Common, MD.  I have reviewed the above documentation for accuracy and completeness, and I agree with the above. - Coralie Common, MD

## 2023-01-20 LAB — IFOBT (OCCULT BLOOD): IFOBT: NEGATIVE

## 2023-02-01 ENCOUNTER — Encounter (INDEPENDENT_AMBULATORY_CARE_PROVIDER_SITE_OTHER): Payer: Self-pay | Admitting: Family Medicine

## 2023-02-01 ENCOUNTER — Ambulatory Visit (INDEPENDENT_AMBULATORY_CARE_PROVIDER_SITE_OTHER): Payer: 59 | Admitting: Family Medicine

## 2023-02-01 ENCOUNTER — Other Ambulatory Visit: Payer: Self-pay | Admitting: Family Medicine

## 2023-02-01 VITALS — BP 152/80 | HR 68 | Temp 98.4°F | Ht 67.0 in | Wt 301.0 lb

## 2023-02-01 DIAGNOSIS — R7303 Prediabetes: Secondary | ICD-10-CM

## 2023-02-01 DIAGNOSIS — Z6841 Body Mass Index (BMI) 40.0 and over, adult: Secondary | ICD-10-CM

## 2023-02-01 DIAGNOSIS — E669 Obesity, unspecified: Secondary | ICD-10-CM

## 2023-02-01 DIAGNOSIS — I10 Essential (primary) hypertension: Secondary | ICD-10-CM

## 2023-02-01 DIAGNOSIS — Z1231 Encounter for screening mammogram for malignant neoplasm of breast: Secondary | ICD-10-CM

## 2023-02-01 NOTE — Progress Notes (Signed)
Chief Complaint:   OBESITY Meredith Wright is here to discuss her progress with her obesity treatment plan along with follow-up of her obesity related diagnoses. Meredith Wright is on the Category 4 Plan and keeping a food journal and adhering to recommended goals of 550-650 calories and 50+ protein and states she is following her eating plan approximately 100% of the time. Meredith Wright states she is 5,000-10,000 steps 7 times per week.  Today's visit was #: 5 Starting weight: 65 LBS Starting date: 11/30/2022 Today's weight: 301 LBS Today's date: 02/01/2023 Total lbs lost to date: 7 LBS Total lbs lost since last in-office visit: 0  Interim History: Patient has been following meal plan and exercising since last appointment.  Her family has been preparing food for her- salmon patties, steak, broccoli and a small cake for her birthday.  Supper meal has been 10oz occasionally.  For breakfast she is doing 3 eggs, sausage, toast, milk. 2 hours later she is eating a banana.  2 hours after that she is doing a protein shake and a fruit. 2 hours later she is having another banana.  Supper she is doing 10oz of protein and occasionally some noodles and milk.  Pasta is regular store bought pasta. Upon review the lunch option she is taking in is only around 300 calories total and 16g of protein.  Next few weeks her children have some upcoming tournaments.  Subjective:   1. Prediabetes Patient last A1c was 6.1.  Minimal carb cravings.  Patient is not on any medication.  2. Essential hypertension Blood pressure is controlled on second read.  Patient denies chest pain, chest pressure, headache.  Assessment/Plan:   1. Prediabetes Repeat labs in 2 months.  2. Essential hypertension Continue current medications no change in dose today.  Follow-up blood pressure next appointment, may need to increase dose of Norvasc.  3. BMI 45.0-49.9, adult ([place])  4. Obesity with starting BMI of 48.4 Meredith Wright is currently in the action stage of  change. As such, her goal is to continue with weight loss efforts. She has agreed to the Category 4 Plan + lunch options and keeping a food journal and adhering to recommended goals of 450-550 calories 40+protein at lunch 550-650 and 50+ protein at supper.     Exercise goals: All adults should avoid inactivity. Some physical activity is better than none, and adults who participate in any amount of physical activity gain some health benefits.  Behavioral modification strategies: increasing lean protein intake, meal planning and cooking strategies, keeping healthy foods in the home, and planning for success.  Meredith Wright has agreed to follow-up with our clinic in 3 weeks. She was informed of the importance of frequent follow-up visits to maximize her success with intensive lifestyle modifications for her multiple health conditions.   Objective:   Blood pressure (!) 152/80, pulse 68, temperature 98.4 F (36.9 C), height 5\' 7"  (1.702 m), weight (!) 301 lb (136.5 kg), SpO2 96 %. Body mass index is 47.14 kg/m.  General: Cooperative, alert, well developed, in no acute distress. HEENT: Conjunctivae and lids unremarkable. Cardiovascular: Regular rhythm.  Lungs: Normal work of breathing. Neurologic: No focal deficits.   Lab Results  Component Value Date   CREATININE 0.81 11/30/2022   BUN 17 11/30/2022   NA 142 11/30/2022   K 4.0 11/30/2022   CL 103 11/30/2022   CO2 21 11/30/2022   Lab Results  Component Value Date   ALT 22 11/30/2022   AST 16 11/30/2022   ALKPHOS 142 (  H) 11/30/2022   BILITOT 0.4 11/30/2022   Lab Results  Component Value Date   HGBA1C 6.1 (H) 11/30/2022   Lab Results  Component Value Date   INSULIN 13.7 11/30/2022   Lab Results  Component Value Date   TSH 1.520 11/30/2022   Lab Results  Component Value Date   CHOL 243 (H) 11/30/2022   HDL 42 11/30/2022   LDLCALC 157 (H) 11/30/2022   TRIG 236 (H) 11/30/2022   CHOLHDL 5.4 (H) 06/13/2022   Lab Results  Component  Value Date   VD25OH 11.7 (L) 11/30/2022   Lab Results  Component Value Date   WBC 12.4 (H) 11/30/2022   HGB 13.3 11/30/2022   HCT 40.3 11/30/2022   MCV 83 11/30/2022   PLT 364 11/30/2022   Lab Results  Component Value Date   IRON 43 06/06/2013   TIBC 328 06/06/2013   Attestation Statements:   Reviewed by clinician on day of visit: allergies, medications, problem list, medical history, surgical history, family history, social history, and previous encounter notes.  I, Davy Pique, RMA, am acting as transcriptionist for Coralie Common, MD.   I have reviewed the above documentation for accuracy and completeness, and I agree with the above. Coralie Common, MD'

## 2023-02-02 ENCOUNTER — Ambulatory Visit (INDEPENDENT_AMBULATORY_CARE_PROVIDER_SITE_OTHER): Payer: 59 | Admitting: Family Medicine

## 2023-02-02 VITALS — BP 138/81

## 2023-02-02 DIAGNOSIS — I10 Essential (primary) hypertension: Secondary | ICD-10-CM

## 2023-02-02 NOTE — Progress Notes (Signed)
Pt was here for a blood pressure re-check. Pt blood pressure was normal.

## 2023-02-03 ENCOUNTER — Ambulatory Visit
Admission: RE | Admit: 2023-02-03 | Discharge: 2023-02-03 | Disposition: A | Discharge status: Home / Self Care | Payer: 59 | Source: Ambulatory Visit | Attending: Internal Medicine | Admitting: Internal Medicine

## 2023-02-03 DIAGNOSIS — Z1231 Encounter for screening mammogram for malignant neoplasm of breast: Secondary | ICD-10-CM

## 2023-02-15 ENCOUNTER — Ambulatory Visit (INDEPENDENT_AMBULATORY_CARE_PROVIDER_SITE_OTHER): Payer: 59 | Admitting: Family Medicine

## 2023-02-15 ENCOUNTER — Encounter (INDEPENDENT_AMBULATORY_CARE_PROVIDER_SITE_OTHER): Payer: Self-pay | Admitting: Family Medicine

## 2023-02-15 VITALS — BP 132/78 | HR 69 | Temp 98.4°F | Ht 67.0 in | Wt 301.0 lb

## 2023-02-15 DIAGNOSIS — E669 Obesity, unspecified: Secondary | ICD-10-CM

## 2023-02-15 DIAGNOSIS — E559 Vitamin D deficiency, unspecified: Secondary | ICD-10-CM

## 2023-02-15 DIAGNOSIS — I10 Essential (primary) hypertension: Secondary | ICD-10-CM

## 2023-02-15 DIAGNOSIS — Z6841 Body Mass Index (BMI) 40.0 and over, adult: Secondary | ICD-10-CM

## 2023-02-15 MED ORDER — VITAMIN D (ERGOCALCIFEROL) 1.25 MG (50000 UNIT) PO CAPS: 50000.0000 [IU] | ORAL_CAPSULE | ORAL | 0 refills | Status: DC

## 2023-02-15 NOTE — Progress Notes (Signed)
Chief Complaint:   OBESITY Meredith Wright is here to discuss her progress with her obesity treatment plan along with follow-up of her obesity related diagnoses. Meredith Wright is on the Category 4 Plan and keeping a food journal and adhering to recommended goals of 550-650  calories and 50 protein at supper, 450-550 calories and 40 protein at lunch and states she is following her eating plan approximately 100% of the time. Meredith Wright states she is walking at home.   Today's visit was #: 5 Starting weight: 308 lbs Starting date: 11/30/2022 Today's weight: 301 lbs Today's date: 02/15/2023 Total lbs lost to date: 7 lbs Total lbs lost since last in-office visit: 0  Interim History: Patient has been following plan strictly for last few weeks. She is making her own shake in the am, having her microwave meal for lunch and then doing protein and vegetables for dinner. Originally she voiced she was having 4oz at supper but then stated she is actually having 10oz at supper.  She has been keeping track of her food choices on her computer at home.    Subjective:   1. Essential hypertension Patient blood pressure controlled today.  Patient denies chest pain, chest pressure, headache.  2. Vitamin D deficiency Patient is on prescription vitamin D.  Patient denies nausea, vomiting, muscle weakness.  Patient is positive for fatigue.  Assessment/Plan:   1. Essential hypertension Continue Norvasc and Diovan.  No change in dose.  2. Vitamin D deficiency Refill- Vitamin D, Ergocalciferol, (DRISDOL) 1.25 MG (50000 UNIT) CAPS capsule; Take 1 capsule (50,000 Units total) by mouth every 7 (seven) days.  Dispense: 4 capsule; Refill: 0  3. BMI 45.0-49.9, adult  4. Obesity with starting BMI of 48.4 Meredith Wright is currently in the action stage of change. As such, her goal is to continue with weight loss efforts. She has agreed to the Category 4 Plan.  Patient prolonged 2 to 3 days/week on my fitness pal to ensure calorie and macro  intake.  Exercise goals: All adults should avoid inactivity. Some physical activity is better than none, and adults who participate in any amount of physical activity gain some health benefits.  Behavioral modification strategies: increasing lean protein intake, meal planning and cooking strategies, keeping healthy foods in the home, and planning for success.  Meredith Wright has agreed to follow-up with our clinic in 2 weeks. She was informed of the importance of frequent follow-up visits to maximize her success with intensive lifestyle modifications for her multiple health conditions.   Objective:   Blood pressure 132/78, pulse 69, temperature 98.4 F (36.9 C), height 5\' 7"  (1.702 m), weight (!) 301 lb (136.5 kg), SpO2 99 %. Body mass index is 47.14 kg/m.  General: Cooperative, alert, well developed, in no acute distress. HEENT: Conjunctivae and lids unremarkable. Cardiovascular: Regular rhythm.  Lungs: Normal work of breathing. Neurologic: No focal deficits.   Lab Results  Component Value Date   CREATININE 0.81 11/30/2022   BUN 17 11/30/2022   NA 142 11/30/2022   K 4.0 11/30/2022   CL 103 11/30/2022   CO2 21 11/30/2022   Lab Results  Component Value Date   ALT 22 11/30/2022   AST 16 11/30/2022   ALKPHOS 142 (H) 11/30/2022   BILITOT 0.4 11/30/2022   Lab Results  Component Value Date   HGBA1C 6.1 (H) 11/30/2022   Lab Results  Component Value Date   INSULIN 13.7 11/30/2022   Lab Results  Component Value Date   TSH 1.520 11/30/2022  Lab Results  Component Value Date   CHOL 243 (H) 11/30/2022   HDL 42 11/30/2022   LDLCALC 157 (H) 11/30/2022   TRIG 236 (H) 11/30/2022   CHOLHDL 5.4 (H) 06/13/2022   Lab Results  Component Value Date   VD25OH 11.7 (L) 11/30/2022   Lab Results  Component Value Date   WBC 12.4 (H) 11/30/2022   HGB 13.3 11/30/2022   HCT 40.3 11/30/2022   MCV 83 11/30/2022   PLT 364 11/30/2022   Lab Results  Component Value Date   IRON 43 06/06/2013    TIBC 328 06/06/2013   Attestation Statements:   Reviewed by clinician on day of visit: allergies, medications, problem list, medical history, surgical history, family history, social history, and previous encounter notes.  I, Malcolm Metro, RMA, am acting as transcriptionist for Reuben Likes, MD. I have reviewed the above documentation for accuracy and completeness, and I agree with the above. - Reuben Likes, MD

## 2023-03-02 ENCOUNTER — Ambulatory Visit (INDEPENDENT_AMBULATORY_CARE_PROVIDER_SITE_OTHER): Payer: 59 | Admitting: Family Medicine

## 2023-03-02 ENCOUNTER — Encounter (INDEPENDENT_AMBULATORY_CARE_PROVIDER_SITE_OTHER): Payer: Self-pay | Admitting: Family Medicine

## 2023-03-02 VITALS — BP 134/78 | HR 70 | Temp 97.9°F | Ht 67.0 in | Wt 302.0 lb

## 2023-03-02 DIAGNOSIS — R7303 Prediabetes: Secondary | ICD-10-CM | POA: Diagnosis not present

## 2023-03-02 DIAGNOSIS — E669 Obesity, unspecified: Secondary | ICD-10-CM

## 2023-03-02 DIAGNOSIS — Z6841 Body Mass Index (BMI) 40.0 and over, adult: Secondary | ICD-10-CM

## 2023-03-02 DIAGNOSIS — I10 Essential (primary) hypertension: Secondary | ICD-10-CM | POA: Diagnosis not present

## 2023-03-02 DIAGNOSIS — E559 Vitamin D deficiency, unspecified: Secondary | ICD-10-CM

## 2023-03-02 MED ORDER — VITAMIN D (ERGOCALCIFEROL) 1.25 MG (50000 UNIT) PO CAPS: 50000.0000 [IU] | ORAL_CAPSULE | ORAL | 0 refills | Status: DC

## 2023-03-02 NOTE — Progress Notes (Signed)
Carlye Grippe, D.O.  ABFM, ABOM Specializing in Clinical Bariatric Medicine  Office located at: [address] [place], [place]  [address]     Assessment and Plan:   No orders of the defined types were placed in this encounter.   Medications Discontinued During This Encounter  Medication Reason   Vitamin D, Ergocalciferol, (DRISDOL) 1.25 MG (50000 UNIT) CAPS capsule Reorder     Meds ordered this encounter  Medications   Vitamin D, Ergocalciferol, (DRISDOL) 1.25 MG (50000 UNIT) CAPS capsule    Sig: Take 1 capsule (50,000 Units total) by mouth every 7 (seven) days.    Dispense:  4 capsule    Refill:  0    Obtain Labs (I.e Vitamin D) at next OV  Essential hypertension Assessment: Condition is stable.   Last 3 blood pressure readings in our office are as follows: BP Readings from Last 3 Encounters:  03/02/23 134/78  02/15/23 132/78  02/02/23 138/81  - Today her blood pressure is stable. Asymptomatic. No concerns.  - She reports good compliance and tolerance with Diovan 160 mg daily and Norvasc 5 mg daily. Denies any side effects.  Plan:  - Continue with meds as prescribed by her PCP.  - Continue with prudent nutritional plan and low sodium diet, advance exercise as tolerated.     Prediabetes Assessment: Condition is not optimized.  Lab Results  Component Value Date   HGBA1C 6.1 (H) 11/30/2022   INSULIN 13.7 11/30/2022  - Patient is currently not on any prediabetes medication. This is diet controlled. - Patient endorses that her hunger and cravings are controlled when eating on plan.   Plan: - Continue to decrease simple carbs/ sugars; increase fiber and proteins -> follow her meal plan.   - Lusero will continue to work on weight loss, exercise, via their meal plan we devised to help decrease the risk of progressing to diabetes.    Vitamin D deficiency Assessment: Condition is Not optimized..  Lab Results  Component Value Date   VD25OH 11.7 (L)  11/30/2022  - No issues with Ergocalciferol 50K IU weekly. Denies any side effects.  Plan: - Continue with med. Will refill Ergocalciferol 50K IU  today.    - weight loss will likely improve availability of vitamin D, thus encouraged Ashtynn to continue with meal plan and their weight loss efforts to further improve this condition.    TREATMENT PLAN FOR OBESITY: BMI 45.0-49.9, adult Obesity with starting BMI of 48.4 Assessment: Condition is improving, but not optimized. Biometric data collected today, was reviewed with patient.   Muscle mass has increased by 0.6 lb. Total body water has increased by 2.8 lb. Fat mass has decreased by 13.4 lbs.   Plan:  Jossette is currently in the action stage of change. As such, her goal is to continue weight management plan. Donetta will work on healthier eating habits and switched patient from Category 4 meal plan to Category 3 meal plan with breakfast and lunch options based on her most recent RMR calculation.   - I reviewed the Category 3 meal plan with the patient.  - I encouraged her to calculate the amount of calories in her home made fruit smoothies for her own awareness.   Behavioral Intervention Additional resources provided today: category 3 meal plan information, breakfast options, and lunch options Evidence-based interventions for health behavior change were utilized today including the discussion of self monitoring techniques, problem-solving barriers and SMART goal setting techniques.   Regarding patient's less  desirable eating habits and patterns, we employed the technique of small changes.  Pt will specifically work on: continue prudent nutritional plan and paying attention to how much calories she is drinking in her smoothie for next visit.    Recommended Physical Activity Goals Safiyah has been advised to work up to 150 minutes of moderate intensity aerobic activity a week and strengthening exercises 2-3 times per week for cardiovascular health,  weight loss maintenance and preservation of muscle mass.  She has agreed to Continue current level of physical activity   FOLLOW UP: Return in about 3 weeks (around 03/23/2023). She was informed of the importance of frequent follow up visits to maximize her success with intensive lifestyle modifications for her multiple health conditions.   Subjective:   Chief complaint: Obesity Tracye is here to discuss her progress with her obesity treatment plan. She is on the the Category 4 Plan and states she is following her eating plan approximately 90 % of the time. She states she is walking 10,000 step at home 7 days per week.  Interval History:  Nadiah A Garver is here for a follow up office visit.  Since last office visit: - She is measuring her lean protein intake.  - At lunch, she eats a [place] sandwich with 4 ounces of protein - At dinner, she eats about 10 ounces of protein  - For snacks, she makes her own large fruit smoothies (banana's, strawberries, etc..)  she endorses that it is high in calories, but does not know the exact amount.  - When eating on plan, her hunger and cravings are well controlled.     Review of Systems:  Pertinent positives were addressed with patient today.  Weight Summary and Biometrics   No data recorded Weight Gained Since Last Visit: 1 lb    Vitals Temp: 97.9 F (36.6 C) BP: 134/78 Pulse Rate: 70 SpO2: 99 %   Anthropometric Measurements Height: 5\' 7"  (1.702 m) Weight: (!) 302 lb (137 kg) BMI (Calculated): 47.29 Weight at Last Visit: 301 lb Weight Gained Since Last Visit: 1 lb Starting Weight: 308 lb Total Weight Loss (lbs): 6 lb (2.722 kg)   Body Composition  Body Fat %: 51.1 % Fat Mass (lbs): 140.6 lbs Muscle Mass (lbs): 140.6 lbs Total Body Water (lbs): 109.8 lbs Visceral Fat Rating : 18   Other Clinical Data Fasting: No Labs: No Today's Visit #: 6 Starting Date: 11/30/22   Objective:   PHYSICAL EXAM: Blood pressure 134/78,  pulse 70, temperature 97.9 F (36.6 C), height 5\' 7"  (1.702 m), weight (!) 302 lb (137 kg), SpO2 99 %. Body mass index is 47.3 kg/m.  General: Well Developed, well nourished, and in no acute distress.  HEENT: Normocephalic, atraumatic Skin: Warm and dry, cap RF less 2 sec, good turgor Chest:  Normal excursion, shape, no gross abn Respiratory: speaking in full sentences, no conversational dyspnea NeuroM-Sk: Ambulates w/o assistance, moves * 4 Psych: A and O *3, insight good, mood-full  DIAGNOSTIC DATA REVIEWED:  BMET    Component Value Date/Time   NA 142 11/30/2022 0929   K 4.0 11/30/2022 0929   CL 103 11/30/2022 0929   CO2 21 11/30/2022 0929   GLUCOSE 81 11/30/2022 0929   GLUCOSE 92 01/15/2021 1200   BUN 17 11/30/2022 0929   CREATININE 0.81 11/30/2022 0929   CREATININE 0.60 06/06/2013 0942   CALCIUM 9.3 11/30/2022 0929   GFRNONAA >60 01/15/2021 1200   GFRNONAA >89 06/06/2013 0942   GFRAA >60 12/20/2015  1814   GFRAA >89 06/06/2013 0942   Lab Results  Component Value Date   HGBA1C 6.1 (H) 11/30/2022   Lab Results  Component Value Date   INSULIN 13.7 11/30/2022   Lab Results  Component Value Date   TSH 1.520 11/30/2022   CBC    Component Value Date/Time   WBC 12.4 (H) 11/30/2022 0929   WBC 9.0 01/15/2021 1200   RBC 4.87 11/30/2022 0929   RBC 4.76 01/15/2021 1200   HGB 13.3 11/30/2022 0929   HCT 40.3 11/30/2022 0929   PLT 364 11/30/2022 0929   MCV 83 11/30/2022 0929   MCH 27.3 11/30/2022 0929   MCH 27.5 01/15/2021 1200   MCHC 33.0 11/30/2022 0929   MCHC 34.3 01/15/2021 1200   RDW 14.5 11/30/2022 0929   Iron Studies    Component Value Date/Time   IRON 43 06/06/2013 0942   TIBC 328 06/06/2013 0942   IRONPCTSAT 13 (L) 06/06/2013 0942   Lipid Panel     Component Value Date/Time   CHOL 243 (H) 11/30/2022 0929   TRIG 236 (H) 11/30/2022 0929   HDL 42 11/30/2022 0929   CHOLHDL 5.4 (H) 06/13/2022 0923   CHOLHDL 4.2 01/15/2021 1200   VLDL 32 01/15/2021  1200   LDLCALC 157 (H) 11/30/2022 0929   Hepatic Function Panel     Component Value Date/Time   PROT 7.4 11/30/2022 0929   ALBUMIN 4.3 11/30/2022 0929   AST 16 11/30/2022 0929   ALT 22 11/30/2022 0929   ALKPHOS 142 (H) 11/30/2022 0929   BILITOT 0.4 11/30/2022 0929      Component Value Date/Time   TSH 1.520 11/30/2022 0929   Nutritional Lab Results  Component Value Date   VD25OH 11.7 (L) 11/30/2022    Attestations:   Reviewed by clinician on day of visit: allergies, medications, problem list, medical history, surgical history, family history, social history, and previous encounter notes.   I,Special Puri,acting as a Neurosurgeon for [employer], DO.,have documented all relevant documentation on the behalf of Thomasene Lot, DO,as directed by  Thomasene Lot, DO while in the presence of Thomasene Lot, DO.   I, Thomasene Lot, DO, have reviewed all documentation for this visit. The documentation on 03/02/23 for the exam, diagnosis, procedures, and orders are all accurate and complete.

## 2023-03-03 ENCOUNTER — Other Ambulatory Visit (INDEPENDENT_AMBULATORY_CARE_PROVIDER_SITE_OTHER): Payer: Self-pay | Admitting: Family Medicine

## 2023-03-03 DIAGNOSIS — E7849 Other hyperlipidemia: Secondary | ICD-10-CM

## 2023-03-10 ENCOUNTER — Other Ambulatory Visit (HOSPITAL_BASED_OUTPATIENT_CLINIC_OR_DEPARTMENT_OTHER): Payer: Self-pay | Admitting: Family Medicine

## 2023-03-10 DIAGNOSIS — I10 Essential (primary) hypertension: Secondary | ICD-10-CM

## 2023-03-15 ENCOUNTER — Ambulatory Visit (INDEPENDENT_AMBULATORY_CARE_PROVIDER_SITE_OTHER): Payer: 59 | Admitting: Family Medicine

## 2023-03-15 ENCOUNTER — Encounter (INDEPENDENT_AMBULATORY_CARE_PROVIDER_SITE_OTHER): Payer: Self-pay | Admitting: Family Medicine

## 2023-03-15 VITALS — BP 119/71 | HR 69 | Temp 98.5°F | Ht 67.0 in | Wt 304.0 lb

## 2023-03-15 DIAGNOSIS — Z6841 Body Mass Index (BMI) 40.0 and over, adult: Secondary | ICD-10-CM

## 2023-03-15 DIAGNOSIS — Z9189 Other specified personal risk factors, not elsewhere classified: Secondary | ICD-10-CM | POA: Diagnosis not present

## 2023-03-15 DIAGNOSIS — E7849 Other hyperlipidemia: Secondary | ICD-10-CM | POA: Diagnosis not present

## 2023-03-15 DIAGNOSIS — E669 Obesity, unspecified: Secondary | ICD-10-CM | POA: Diagnosis not present

## 2023-03-15 DIAGNOSIS — E559 Vitamin D deficiency, unspecified: Secondary | ICD-10-CM | POA: Diagnosis not present

## 2023-03-15 MED ORDER — ATORVASTATIN CALCIUM 10 MG PO TABS: 10.0000 mg | ORAL_TABLET | Freq: Every day | ORAL | 0 refills | Status: DC

## 2023-03-15 MED ORDER — VITAMIN D (ERGOCALCIFEROL) 1.25 MG (50000 UNIT) PO CAPS
50000.0000 [IU] | ORAL_CAPSULE | ORAL | 0 refills | Status: DC
Start: 2023-03-15 — End: 2023-04-05

## 2023-03-15 NOTE — Progress Notes (Signed)
Chief Complaint:   OBESITY Analysia is here to discuss her progress with her obesity treatment plan along with follow-up of her obesity related diagnoses. Zymirah is on the Category 3 Plan and states she is following her eating plan approximately 100% of the time. Joycelyn states she is not exercising.  Today's visit was #: 7 Starting weight: 308 lbs Starting date: 11/30/22 Today's weight: 304 lbs Today's date: 03/15/2023 Total lbs lost to date: 0 Total lbs lost since last in-office visit: 4  Interim History: Patients arthritis has been acting up and she hasn't been able to be as active as she wanted to be.  She also thinks she may need new shoes. She wrapped up her foot and really couldn't do much even with that. She feels frustrated and discouraged with inability to exercise.  Last few weeks she has been working on getting all the food from the plan in.  She is eating eggs, cheese and bread with milk in am.  For lunch she is occasionally eating out- she went to [employer] and ate the flatbread with grilled chicken and all the greens available. Has been logging food religiously and has been averaging 1400-1500 calories with 97-150g of protein a day.   Subjective:   1. Other hyperlipidemia Marquida is on Lipitor daily and she notes no side effects on Lipitor.  2. Vitamin D deficiency Ashlei is on prescription vitamin D and her last labs were in January. She notes fatigue and denies nausea, vomiting, and muscle weakness.  3. Cardiovascular risk factor Avyn has elevated blood pressure as well as hypercholesterolemia. She has been consistently logging.  4. BMI 45.0-49.9, adult (HCC)  Assessment/Plan:   1. Other hyperlipidemia Brenna agrees to continue taking Lipitor 10mg  and will follow up as directed.  - atorvastatin (LIPITOR) 10 MG tablet; Take 1 tablet (10 mg total) by mouth daily.  Dispense: 30 tablet; Refill: 0  2. Vitamin D deficiency Codi agrees to continue taking prescription  vitamin D and will follow up at the agreed upon time.  - Vitamin D, Ergocalciferol, (DRISDOL) 1.25 MG (50000 UNIT) CAPS capsule; Take 1 capsule (50,000 Units total) by mouth every 7 (seven) days.  Dispense: 4 capsule; Refill: 0  3. Cardiovascular risk factor Rinki agreed to start [facility] 0.25mg  weekly and will follow up as directed.  4. BMI 45.0-49.9, adult (HCC)  5. Obesity with starting BMI of 49.2  Shivya is currently in the action stage of change. As such, her goal is to continue with weight loss efforts. She has agreed to the Category 3 Plan.   Exercise goals:  Sofa Exercises were given with discussion to start the connection between muscle contraction, then movement.  Behavioral modification strategies: increasing lean protein intake, meal planning and cooking strategies, keeping healthy foods in the home, and planning for success.  Yannis has agreed to follow-up with our clinic in 3 weeks. She was informed of the importance of frequent follow-up visits to maximize her success with intensive lifestyle modifications for her multiple health conditions.   Objective:   Blood pressure 119/71, pulse 69, temperature 98.5 F (36.9 C), height 5\' 7"  (1.702 m), weight (!) 304 lb (137.9 kg), SpO2 98 %. Body mass index is 47.61 kg/m.  General: Cooperative, alert, well developed, in no acute distress. HEENT: Conjunctivae and lids unremarkable. Cardiovascular: Regular rhythm.  Lungs: Normal work of breathing. Neurologic: No focal deficits.   Lab Results  Component Value Date   CREATININE 0.81 11/30/2022  BUN 17 11/30/2022   NA 142 11/30/2022   K 4.0 11/30/2022   CL 103 11/30/2022   CO2 21 11/30/2022   Lab Results  Component Value Date   ALT 22 11/30/2022   AST 16 11/30/2022   ALKPHOS 142 (H) 11/30/2022   BILITOT 0.4 11/30/2022   Lab Results  Component Value Date   HGBA1C 6.1 (H) 11/30/2022   Lab Results  Component Value Date   INSULIN 13.7 11/30/2022   Lab Results   Component Value Date   TSH 1.520 11/30/2022   Lab Results  Component Value Date   CHOL 243 (H) 11/30/2022   HDL 42 11/30/2022   LDLCALC 157 (H) 11/30/2022   TRIG 236 (H) 11/30/2022   CHOLHDL 5.4 (H) 06/13/2022   Lab Results  Component Value Date   VD25OH 11.7 (L) 11/30/2022   Lab Results  Component Value Date   WBC 12.4 (H) 11/30/2022   HGB 13.3 11/30/2022   HCT 40.3 11/30/2022   MCV 83 11/30/2022   PLT 364 11/30/2022   Lab Results  Component Value Date   IRON 43 06/06/2013   TIBC 328 06/06/2013    Obesity Behavioral Intervention:   Approximately 15 minutes were spent on the discussion below.  ASK: We discussed the diagnosis of obesity with Jaselynn today and Besse agreed to give [place] permission to discuss obesity behavioral modification therapy today.  ASSESS: Cilicia has the diagnosis of obesity and her BMI today is 47.7. Anyka is in the action stage of change.   ADVISE: Jaelee was educated on the multiple health risks of obesity as well as the benefit of weight loss to improve her health. She was advised of the need for long term treatment and the importance of lifestyle modifications to improve her current health and to decrease her risk of future health problems.  AGREE: Multiple dietary modification options and treatment options were discussed and Lashanti agreed to follow the recommendations documented in the above note.  ARRANGE: Tressy was educated on the importance of frequent visits to treat obesity as outlined per CMS and USPSTF guidelines and agreed to schedule her next follow up appointment today.  Attestation Statements:   Reviewed by clinician on day of visit: allergies, medications, problem list, medical history, surgical history, family history, social history, and previous encounter notes.  IKirke Corin, CMA, am acting as transcriptionist for Reuben Likes, MD  I have reviewed the above documentation for accuracy and completeness, and I agree with the  above. - Reuben Likes, MD

## 2023-03-22 ENCOUNTER — Encounter (INDEPENDENT_AMBULATORY_CARE_PROVIDER_SITE_OTHER): Payer: Self-pay

## 2023-03-30 ENCOUNTER — Other Ambulatory Visit (INDEPENDENT_AMBULATORY_CARE_PROVIDER_SITE_OTHER): Payer: Self-pay | Admitting: Family Medicine

## 2023-03-30 DIAGNOSIS — E7849 Other hyperlipidemia: Secondary | ICD-10-CM

## 2023-04-04 ENCOUNTER — Other Ambulatory Visit (INDEPENDENT_AMBULATORY_CARE_PROVIDER_SITE_OTHER): Payer: Self-pay | Admitting: Family Medicine

## 2023-04-04 ENCOUNTER — Ambulatory Visit (INDEPENDENT_AMBULATORY_CARE_PROVIDER_SITE_OTHER): Payer: 59 | Admitting: Family Medicine

## 2023-04-04 ENCOUNTER — Encounter (HOSPITAL_BASED_OUTPATIENT_CLINIC_OR_DEPARTMENT_OTHER): Payer: Self-pay | Admitting: Family Medicine

## 2023-04-04 VITALS — BP 148/83 | HR 80 | Ht 67.0 in | Wt 312.8 lb

## 2023-04-04 DIAGNOSIS — L910 Hypertrophic scar: Secondary | ICD-10-CM

## 2023-04-04 DIAGNOSIS — M7662 Achilles tendinitis, left leg: Secondary | ICD-10-CM | POA: Diagnosis not present

## 2023-04-04 DIAGNOSIS — I10 Essential (primary) hypertension: Secondary | ICD-10-CM | POA: Diagnosis not present

## 2023-04-04 DIAGNOSIS — E559 Vitamin D deficiency, unspecified: Secondary | ICD-10-CM

## 2023-04-04 NOTE — Assessment & Plan Note (Signed)
Blood pressure borderline on initial reading, did improve slightly on recheck.  She continues with amlodipine and valsartan, denies any issues with medication at this time.  She did have nurse visit for blood pressure recheck since last appointment in the office and that showed adequate control of blood pressure.  No current symptoms related to the chest pain, headaches, lightheadedness or dizziness Discussed options, given adequate control and continue to work on lifestyle modifications, can continue with current medication regimen with no changes to be made today. Recommend intermittent monitoring of blood pressure at home, DASH diet

## 2023-04-04 NOTE — Patient Instructions (Signed)
  Medication Instructions:  Your physician recommends that you continue on your current medications as directed. Please refer to the Current Medication list given to you today. --If you need a refill on any your medications before your next appointment, please call your pharmacy first. If no refills are authorized on file call the office.-- Lab Work: Your physician has recommended that you have lab work today: No If you have labs (blood work) drawn today and your tests are completely normal, you will receive your results via MyChart message OR a phone call from our staff.  Please ensure you check your voicemail in the event that you authorized detailed messages to be left on a delegated number. If you have any lab test that is abnormal or we need to change your treatment, we will call you to review the results.  Referrals/Procedures/Imaging: Yes  Follow-Up: Your next appointment:   Your physician recommends that you schedule a follow-up appointment in: 2-3 months with Dr. de [place].  You will receive a text message or e-mail with a link to a survey about your care and experience with us today! We would greatly appreciate your feedback!   Thanks for letting us be apart of your health journey!!  Primary Care and Sports Medicine   Dr. Raymond de Cuba   We encourage you to activate your patient portal called "MyChart".  Sign up information is provided on this After Visit Summary.  MyChart is used to connect with patients for Virtual Visits (Telemedicine).  Patients are able to view lab/test results, encounter notes, upcoming appointments, etc.  Non-urgent messages can be sent to your provider as well. To learn more about what you can do with MyChart, please visit --  [email or web address].    

## 2023-04-04 NOTE — Progress Notes (Signed)
    Procedures performed today:    None.  Independent interpretation of notes and tests performed by another provider:   None.  Brief History, Exam, Impression, and Recommendations:    BP (!) 148/83 (BP Location: Left Arm, Patient Position: Sitting, Cuff Size: Large)   Pulse 80   Ht 5\' 7"  (1.702 m)   Wt (!) 312 lb 12.8 oz (141.9 kg)   SpO2 98%   BMI 48.99 kg/m   Essential hypertension Blood pressure borderline on initial reading, did improve slightly on recheck.  She continues with amlodipine and valsartan, denies any issues with medication at this time.  She did have nurse visit for blood pressure recheck since last appointment in the office and that showed adequate control of blood pressure.  No current symptoms related to the chest pain, headaches, lightheadedness or dizziness Discussed options, given adequate control and continue to work on lifestyle modifications, can continue with current medication regimen with no changes to be made today. Recommend intermittent monitoring of blood pressure at home, DASH diet  Achilles tendinitis of left lower extremity Patient reports that she has been having left heel/ankle pain for 1 to 2 months now, does not recall any injury, pain was noted with insidious onset.  Pain worse with ambulating.  She has been wearing over-the-counter ankle brace/sleeve.  Denies any prior similar issues.  Thinks she may have slight swelling in the ankle. On exam, very mild swelling around the ankle, no obvious bruising or discoloration.  Normal active and passive range of motion.  Good strength for flexion, extension, inversion and eversion.  She does have moderate tenderness to palpation over Achilles tendon, mostly located within the distal aspect of Achilles tendon and near insertion. Suspect Achilles tendinitis as current cause of symptoms.  Discussed treatment considerations including physical therapy, heel lifts, icing, oral medications.  Referral to physical  therapy placed today.  Also placed referral to podiatry for patient to schedule as desired  Hypertrophic scar Patient notes that she had blood drawn about 2 months ago.  Blood was drawn from the hand and she noted that she had small bump develop in the area.  She is unsure what this may be related to.  She has not had any pain or other symptoms noted with this.  Area was pointed out to her by her husband.  She denies having any other issues with scars in the past. On exam, raised area of skin noted over dorsal aspect of hand, no erythema, fluctuance, tenderness to palpation. Suspect that area is consistent with hypertrophic scar.  Did discuss treatment considerations including topical corticosteroid, intralesional, excisional therapy.  Recommend further evaluation with dermatologist to further discuss treatment options and how best to proceed, referral placed today  Return in about 3 months (around 07/05/2023) for HTN.   ___________________________________________ Carlo Guevarra de Peru, MD, ABFM, [facility]

## 2023-04-04 NOTE — Assessment & Plan Note (Signed)
Patient notes that she had blood drawn about 2 months ago.  Blood was drawn from the hand and she noted that she had small bump develop in the area.  She is unsure what this may be related to.  She has not had any pain or other symptoms noted with this.  Area was pointed out to her by her husband.  She denies having any other issues with scars in the past. On exam, raised area of skin noted over dorsal aspect of hand, no erythema, fluctuance, tenderness to palpation. Suspect that area is consistent with hypertrophic scar.  Did discuss treatment considerations including topical corticosteroid, intralesional, excisional therapy.  Recommend further evaluation with dermatologist to further discuss treatment options and how best to proceed, referral placed today

## 2023-04-04 NOTE — Assessment & Plan Note (Signed)
Patient reports that she has been having left heel/ankle pain for 1 to 2 months now, does not recall any injury, pain was noted with insidious onset.  Pain worse with ambulating.  She has been wearing over-the-counter ankle brace/sleeve.  Denies any prior similar issues.  Thinks she may have slight swelling in the ankle. On exam, very mild swelling around the ankle, no obvious bruising or discoloration.  Normal active and passive range of motion.  Good strength for flexion, extension, inversion and eversion.  She does have moderate tenderness to palpation over Achilles tendon, mostly located within the distal aspect of Achilles tendon and near insertion. Suspect Achilles tendinitis as current cause of symptoms.  Discussed treatment considerations including physical therapy, heel lifts, icing, oral medications.  Referral to physical therapy placed today.  Also placed referral to podiatry for patient to schedule as desired

## 2023-04-05 ENCOUNTER — Encounter (INDEPENDENT_AMBULATORY_CARE_PROVIDER_SITE_OTHER): Payer: Self-pay | Admitting: Family Medicine

## 2023-04-05 ENCOUNTER — Ambulatory Visit (INDEPENDENT_AMBULATORY_CARE_PROVIDER_SITE_OTHER): Payer: 59 | Admitting: Family Medicine

## 2023-04-05 VITALS — BP 132/74 | HR 79 | Temp 97.9°F | Ht 67.0 in | Wt 306.0 lb

## 2023-04-05 DIAGNOSIS — Z6841 Body Mass Index (BMI) 40.0 and over, adult: Secondary | ICD-10-CM | POA: Diagnosis not present

## 2023-04-05 DIAGNOSIS — E559 Vitamin D deficiency, unspecified: Secondary | ICD-10-CM | POA: Diagnosis not present

## 2023-04-05 DIAGNOSIS — E7849 Other hyperlipidemia: Secondary | ICD-10-CM | POA: Diagnosis not present

## 2023-04-05 DIAGNOSIS — E669 Obesity, unspecified: Secondary | ICD-10-CM

## 2023-04-05 MED ORDER — ATORVASTATIN CALCIUM 10 MG PO TABS: 10.0000 mg | ORAL_TABLET | Freq: Every day | ORAL | 0 refills | Status: DC

## 2023-04-05 MED ORDER — VITAMIN D (ERGOCALCIFEROL) 1.25 MG (50000 UNIT) PO CAPS: 50000.0000 [IU] | ORAL_CAPSULE | ORAL | 0 refills | Status: DC

## 2023-04-10 NOTE — Progress Notes (Unsigned)
Chief Complaint:   OBESITY Meredith Wright is here to discuss her progress with her obesity treatment plan along with follow-up of her obesity related diagnoses. Meredith Wright is on the Category 3 Plan and states she is following her eating plan approximately 60-80% of the time. [name] states she is doing 3 sets of wall exercises for 30-45 minutes 7 times per week.  Today's visit was #: 8 Starting weight: 308 lbs Starting date: 11/30/2022 Today's weight: 306 lbs Today's date: 04/05/2023 Total lbs lost to date: 2 Total lbs lost since last in-office visit: 0  Interim History: Patient has been struggling to follow her plan very closely.  She is working on portion control and [employer], and she is trying to increase her exercise.  Subjective:   1. Other hyperlipidemia Patient is on Lipitor and she is working on decreasing cholesterol in her diet.  2. Vitamin D deficiency Patient is stable on vitamin D, and she denies nausea, vomiting, or muscle weakness.  Assessment/Plan:   1. Other hyperlipidemia Patient will continue with her diet and Lipitor, and we will refill Lipitor at 10 mg once daily for 1 month.  - atorvastatin (LIPITOR) 10 MG tablet; Take 1 tablet (10 mg total) by mouth daily.  Dispense: 30 tablet; Refill: 0  2. Vitamin D deficiency Patient will continue prescription vitamin D, and we will refill for 1 month.  - Vitamin D, Ergocalciferol, (DRISDOL) 1.25 MG (50000 UNIT) CAPS capsule; Take 1 capsule (50,000 Units total) by mouth every 7 (seven) days.  Dispense: 4 capsule; Refill: 0  3. BMI 45.0-49.9, adult (HCC)  4. Obesity with starting BMI of 49.2 Roger is currently in the action stage of change. As such, her goal is to continue with weight loss efforts. She has agreed to the Category 3 Plan or keeping a food journal and adhering to recommended goals of 1400-1700 calories and 100+ grams of protein daily.   Exercise goals: As is.   Behavioral modification strategies:  increasing lean protein intake.  Meredith Wright has agreed to follow-up with our clinic in 2 to 3 weeks. She was informed of the importance of frequent follow-up visits to maximize her success with intensive lifestyle modifications for her multiple health conditions.   Objective:   Blood pressure 132/74, pulse 79, temperature 97.9 F (36.6 C), height 5\' 7"  (1.702 m), weight (!) 306 lb (138.8 kg), SpO2 97 %. Body mass index is 47.93 kg/m.  Lab Results  Component Value Date   CREATININE 0.81 11/30/2022   BUN 17 11/30/2022   NA 142 11/30/2022   K 4.0 11/30/2022   CL 103 11/30/2022   CO2 21 11/30/2022   Lab Results  Component Value Date   ALT 22 11/30/2022   AST 16 11/30/2022   ALKPHOS 142 (H) 11/30/2022   BILITOT 0.4 11/30/2022   Lab Results  Component Value Date   HGBA1C 6.1 (H) 11/30/2022   Lab Results  Component Value Date   INSULIN 13.7 11/30/2022   Lab Results  Component Value Date   TSH 1.520 11/30/2022   Lab Results  Component Value Date   CHOL 243 (H) 11/30/2022   HDL 42 11/30/2022   LDLCALC 157 (H) 11/30/2022   TRIG 236 (H) 11/30/2022   CHOLHDL 5.4 (H) 06/13/2022   Lab Results  Component Value Date   VD25OH 11.7 (L) 11/30/2022   Lab Results  Component Value Date   WBC 12.4 (H) 11/30/2022   HGB 13.3 11/30/2022   HCT 40.3 11/30/2022  MCV 83 11/30/2022   PLT 364 11/30/2022   Lab Results  Component Value Date   IRON 43 06/06/2013   TIBC 328 06/06/2013   Attestation Statements:   Reviewed by clinician on day of visit: allergies, medications, problem list, medical history, surgical history, family history, social history, and previous encounter notes.   I, Burt Knack, am acting as transcriptionist for Quillian Quince, MD.  I have reviewed the above documentation for accuracy and completeness, and I agree with the above. -  Quillian Quince, MD

## 2023-04-13 ENCOUNTER — Ambulatory Visit (INDEPENDENT_AMBULATORY_CARE_PROVIDER_SITE_OTHER): Payer: 59

## 2023-04-13 ENCOUNTER — Ambulatory Visit (INDEPENDENT_AMBULATORY_CARE_PROVIDER_SITE_OTHER): Payer: 59 | Admitting: Podiatry

## 2023-04-13 ENCOUNTER — Encounter: Payer: Self-pay | Admitting: Podiatry

## 2023-04-13 ENCOUNTER — Other Ambulatory Visit: Payer: Self-pay | Admitting: Podiatry

## 2023-04-13 DIAGNOSIS — M7662 Achilles tendinitis, left leg: Secondary | ICD-10-CM

## 2023-04-13 DIAGNOSIS — M7661 Achilles tendinitis, right leg: Secondary | ICD-10-CM

## 2023-04-13 NOTE — Progress Notes (Signed)
Subjective:  Patient ID: Meredith Wright, female    DOB: [birth year],  MRN: [id number] HPI Chief Complaint  Patient presents with   Foot Pain    Posterior heel bilateral (L>R) - fell in March, has MS, numbness and aching, tried ice and heat on recommendations of PCP, also soaking   New Patient (Initial Visit)    52 y.o. female presents with the above complaint.   ROS: Denies fever chills nausea vomit muscle aches pains calf pain back pain chest pain shortness of breath.  Past Medical History:  Diagnosis Date   Arthritis    Joint pain    Multiple food allergies    Multiple sclerosis (HCC)    Sickle cell anemia (HCC)    trait   Past Surgical History:  Procedure Laterality Date   BRAIN SURGERY  11/07/1996   Biopsy -Dx MS   CESAREAN SECTION      Current Outpatient Medications:    amLODipine (NORVASC) 5 MG tablet, TAKE 1 TABLET (5 MG TOTAL) BY MOUTH DAILY., Disp: 90 tablet, Rfl: 1   atorvastatin (LIPITOR) 10 MG tablet, Take 1 tablet (10 mg total) by mouth daily., Disp: 30 tablet, Rfl: 0   diclofenac (VOLTAREN) 75 MG EC tablet, TAKE 1 TABLET BY MOUTH TWICE A DAY, Disp: 60 tablet, Rfl: 1   gabapentin (NEURONTIN) 300 MG capsule, Take 1 capsule by mouth 4 (four) times daily., Disp: , Rfl:    levETIRAcetam (KEPPRA) 500 MG tablet, Take 500 mg by mouth 2 (two) times daily., Disp: , Rfl:    Multiple Vitamin (MULTIVITAMIN) tablet, Take 1 tablet by mouth daily., Disp: , Rfl:    ocrelizumab (OCREVUS) 300 MG/10ML injection, Inject 1 Dose into the vein every 6 (six) months., Disp: , Rfl:    omeprazole (PRILOSEC) 20 MG capsule, Take 20 mg by mouth daily., Disp: , Rfl:    solifenacin (VESICARE) 5 MG tablet, Take 1 tablet by mouth in the morning and at bedtime., Disp: , Rfl:    valsartan (DIOVAN) 160 MG tablet, TAKE 1 TABLET BY MOUTH EVERY DAY, Disp: 90 tablet, Rfl: 1   Vitamin D, Ergocalciferol, (DRISDOL) 1.25 MG (50000 UNIT) CAPS capsule, Take 1 capsule (50,000 Units total) by mouth every 7  (seven) days., Disp: 4 capsule, Rfl: 0  Allergies  Allergen Reactions   Lactose Intolerance (Gi) Diarrhea   Review of Systems Objective:  There were no vitals filed for this visit.  General: Well developed, nourished, in no acute distress, alert and oriented x3   Dermatological: Skin is warm, dry and supple bilateral. Nails x 10 are well maintained; remaining integument appears unremarkable at this time. There are no open sores, no preulcerative lesions, no rash or signs of infection present.  Vascular: Dorsalis Pedis artery and Posterior Tibial artery pedal pulses are 2/4 bilateral with immedate capillary fill time. Pedal hair growth present. No varicosities and no lower extremity edema present bilateral.   Neruologic: Grossly intact via light touch bilateral. Vibratory intact via tuning fork bilateral. Protective threshold with Semmes Wienstein monofilament intact to all pedal sites bilateral. Patellar and Achilles deep tendon reflexes 2+ bilateral. No Babinski or clonus noted bilateral.   Musculoskeletal: No gross boney pedal deformities bilateral. No pain, crepitus, or limitation noted with foot and ankle range of motion bilateral. Muscular strength 5/5 in all groups tested bilateral.  Painful Achilles tendinitis left greater than right there is no warmth in the area.  There is no fluctuance Achilles tendon is intact margins feel intact.  Left is  definitely more swollen than that of the right.  She has good plantarflexion against resistance no pain.  Gait: Unassisted, Nonantalgic.    Radiographs:  Radiographs taken today demonstrate osseously mature individual good bone mineralization.  Some osteoarthritic changes of the midfoot.  Possibly old fracture from the tarsometatarsal joints.  Also demonstrates THE swelling around the insertion site of the Achilles and the posterior superior aspect of the calcaneus particularly on the left over the right.  Assessment & Plan:   Assessment:  Insertional Achilles tendinitis left over right with MS  Plan: Discussed etiology pathology conservative surgical therapy she is already set up for physical therapy but did not know if she should go or not.  I encouraged her to go to physical therapy especially since it does not appear that there is any type of tear.  Also recommended a night splint and a cam boot.  Follow-up with her in 6 to 8 weeks after she has been to several physical therapies.     Reizy Dunlow T. [place], [place]

## 2023-04-26 ENCOUNTER — Ambulatory Visit: Payer: 59 | Attending: Family Medicine

## 2023-04-26 ENCOUNTER — Other Ambulatory Visit: Payer: Self-pay

## 2023-04-26 DIAGNOSIS — R262 Difficulty in walking, not elsewhere classified: Secondary | ICD-10-CM | POA: Insufficient documentation

## 2023-04-26 DIAGNOSIS — M25572 Pain in left ankle and joints of left foot: Secondary | ICD-10-CM | POA: Diagnosis present

## 2023-04-26 DIAGNOSIS — R296 Repeated falls: Secondary | ICD-10-CM | POA: Diagnosis present

## 2023-04-26 DIAGNOSIS — M7662 Achilles tendinitis, left leg: Secondary | ICD-10-CM | POA: Insufficient documentation

## 2023-04-26 DIAGNOSIS — M6281 Muscle weakness (generalized): Secondary | ICD-10-CM | POA: Insufficient documentation

## 2023-04-26 NOTE — Therapy (Signed)
OUTPATIENT PHYSICAL THERAPY LOWER EXTREMITY EVALUATION   Patient Name: Meredith Wright MRN: [id number] DOB:20-Jun-1971, 52 y.o., female Today's Date: 04/26/2023  END OF SESSION:  PT End of Session - 04/26/23 1539     Visit Number 1    Date for PT Re-Evaluation 06/21/23    Authorization Type [employer]ICARE    PT Start Time 1530    PT Stop Time 1618    PT Time Calculation (min) 48 min    Activity Tolerance Patient tolerated treatment well    Behavior During Therapy WFL for tasks assessed/performed             Past Medical History:  Diagnosis Date   Arthritis    Joint pain    Multiple food allergies    Multiple sclerosis (HCC)    Sickle cell anemia (HCC)    trait   Past Surgical History:  Procedure Laterality Date   BRAIN SURGERY  11/07/1996   Biopsy -Dx MS   CESAREAN SECTION     Patient Active Problem List   Diagnosis Date Noted   Achilles tendinitis of left lower extremity 04/04/2023   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   Prediabetes 03/02/2023   Vitamin D deficiency 03/02/2023   Dietary counseling and surveillance 06/08/2022   Hypopigmentation 06/08/2022   Severe obesity (BMI >= 40) (HCC) 05/25/2022   Food allergy 05/04/2022   Lower extremity edema 03/22/2022   Bilateral hand pain 12/27/2021   Trigger thumb of left hand 12/27/2021   Essential hypertension 04/27/2021   Osteoarthritis of left knee 02/25/2021   Knee pain, bilateral 01/15/2021   BMI 40.0-44.9, adult (HCC) 01/15/2021   Sickle cell trait (HCC) 04/08/2016   DUB (dysfunctional uterine bleeding) 04/01/2014   Multiple sclerosis (HCC)     PCP: de Peru, Raymond J, MD   REFERRING PROVIDER: de [place], Raymond J, MD  REFERRING DIAG: [phone] (ICD-10-CM) - Achilles tendinitis of left lower extremity  THERAPY DIAG:  Pain in left ankle and joints of left foot - Plan: PT plan of care cert/re-cert  Difficulty in walking, not elsewhere classified - Plan: PT plan of care cert/re-cert  Muscle  weakness (generalized) - Plan: PT plan of care cert/re-cert  Repeated falls - Plan: PT plan of care cert/re-cert  Rationale for Evaluation and Treatment: Rehabilitation  ONSET DATE: 04/04/2023  SUBJECTIVE:   SUBJECTIVE STATEMENT: Patient reports long history of MS and OA.  I was diagnosed with MS in my 20's.  I am disabled.  I like to work out at home but MD said not to workout due to my ankle.  Pain in left achilles for several months.  She is currently in a walking boot and in night splint at night.  She has pain at the distal achilles at the attachment site.  She hopes to be able to resume exercise and walk without pain.   PERTINENT HISTORY: MS and OA  PAIN:  Are you having pain?  Minimal at rest, at worst 8/10  PRECAUTIONS: Other: MS  WEIGHT BEARING RESTRICTIONS: No  FALLS:  Has patient fallen in last 6 months? Yes. Number of falls March, collapsed from being overheated.  She states overheating with MS patients cause [place] to collapse.   LIVING ENVIRONMENT: Lives with: lives with their spouse Lives in: House/apartment Stairs: Yes: Internal: 3 steps; on right going up and External: 3 steps; on right going up Has following equipment at home: Quad cane small base  OCCUPATION: Disabled  PLOF: Independent, Independent with household mobility with device, Independent  with community mobility with device, Independent with homemaking with ambulation, Independent with gait, and Independent with transfers  PATIENT GOALS: to be able to walk without pain and workout  NEXT MD VISIT: Aug 1  OBJECTIVE:   DIAGNOSTIC FINDINGS: Spur left achilles  PATIENT SURVEYS:  LEFS 44/80  COGNITION: Overall cognitive status: Within functional limits for tasks assessed     SENSATION: WFL   POSTURE:  pes planus and valgus knee  PALPATION: Tender at distal achilles left  LOWER EXTREMITY ROM:  Active ROM Right eval Left eval  Hip flexion    Hip extension    Hip abduction    Hip  adduction    Hip internal rotation    Hip external rotation    Knee flexion    Knee extension    Ankle dorsiflexion  10  Ankle plantarflexion  48  Ankle inversion  28  Ankle eversion  12   (Blank rows = not tested)  LOWER EXTREMITY MMT:  MMT Right eval Left eval  Hip flexion    Hip extension    Hip abduction    Hip adduction    Hip internal rotation    Hip external rotation    Knee flexion    Knee extension    Ankle dorsiflexion    Ankle plantarflexion    Ankle inversion    Ankle eversion     (Blank rows = not tested)   FUNCTIONAL TESTS:  5 times sit to stand: 19.41 sec Timed up and go (TUG): 14.32  GAIT: Distance walked: 30 Assistive device utilized: Quad cane small base Level of assistance: Modified independence Comments: antalgic   TODAY'S TREATMENT:                                                                                                                              DATE: 04/26/23 Initial eval completed and initiated HEP    PATIENT EDUCATION:  Education details: Initiated HEP Person educated: Patient Education method: Programmer, multimedia, Facilities manager, Verbal cues, and Handouts Education comprehension: verbalized understanding, returned demonstration, and verbal cues required  HOME EXERCISE PROGRAM: Access Code: [email or web address] URL: [email or web address] Date: 04/26/2023 Prepared by: Mikey Kirschner  Exercises - Seated Ankle Alphabet  - 1 x daily - 7 x weekly - 1 sets - 1 reps - a-z hold - Seated Heel Raise  - 1 x daily - 7 x weekly - 1 sets - 20 reps - Seated Toe Raise  - 1 x daily - 7 x weekly - 3 sets - 10 reps - Gastroc Stretch on Wall  - 1 x daily - 7 x weekly - 3 sets - 10 reps - Soleus Stretch on Wall  - 1 x daily - 7 x weekly - 3 sets - 10 reps  ASSESSMENT:  CLINICAL IMPRESSION: Patient is a 52 y.o. female who was seen today for physical therapy evaluation and treatment for left achilles   OBJECTIVE IMPAIRMENTS: Abnormal gait,  decreased balance, decreased endurance,  decreased knowledge of use of DME, difficulty walking, decreased ROM, decreased strength, hypomobility, increased fascial restrictions, increased muscle spasms, impaired flexibility, obesity, and pain.   ACTIVITY LIMITATIONS: carrying, lifting, bending, standing, squatting, sleeping, stairs, transfers, and dressing  PARTICIPATION LIMITATIONS: meal prep, cleaning, laundry, shopping, community activity, yard work, and church  PERSONAL FACTORS: Fitness, Past/current experiences, and 3+ comorbidities: MS, OA, and obesity  are also affecting patient's functional outcome.   REHAB POTENTIAL: Fair due to MS and obesity, treatment may be limited  CLINICAL DECISION MAKING: Evolving/moderate complexity  EVALUATION COMPLEXITY: Moderate   GOALS: Goals reviewed with patient? Yes  SHORT TERM GOALS: Target date: 05/24/2023  Pain report to be no greater than 4/10  Baseline: Goal status: INITIAL  2.  Patient will be independent with initial HEP  Baseline:  Goal status: INITIAL  3.  Patient to be able to walk with boot without cane safely Baseline:  Goal status: INITIAL   LONG TERM GOALS: Target date: 06/21/2023   Patient to report pain no greater than 2/10  Baseline:  Goal status: INITIAL  2.  Patient to be independent with advanced HEP  Baseline:  Goal status: INITIAL  3.  Functional scores to improve by 2-3 seconds Baseline:  Goal status: INITIAL  4.  LEFS score to improve to 50-54 Baseline:  Goal status: INITIAL  5.  Patient to be able to ambulate with normal heel to toe progression without boot with minimal pain no greater than 1/10 Baseline:  Goal status: INITIAL  6.  Patient to be able to resume prior exercise routine Baseline:  Goal status: INITIAL   PLAN:  PT FREQUENCY: 2x/week  PT DURATION: 8 weeks  PLANNED INTERVENTIONS: Therapeutic exercises, Therapeutic activity, Neuromuscular re-education, Balance training, Gait  training, Patient/Family education, Self Care, Joint mobilization, Stair training, Orthotic/Fit training, DME instructions, Aquatic Therapy, Dry Needling, Electrical stimulation, Cryotherapy, Splintting, Taping, Ultrasound, Ionotophoresis 4mg /ml Dexamethasone, Manual therapy, and Re-evaluation  PLAN FOR NEXT SESSION: Review HEP, Nustep, manual ROM and possible IASTM.  Ice if painful post session.    Victorino Dike B. Kahlia Lagunes, PT 04/26/23 7:44 PM [facility] [address] [place], [place] [address] Phone # [phone] Fax [phone]

## 2023-04-27 ENCOUNTER — Ambulatory Visit (INDEPENDENT_AMBULATORY_CARE_PROVIDER_SITE_OTHER): Payer: 59 | Admitting: Family Medicine

## 2023-04-27 ENCOUNTER — Encounter (INDEPENDENT_AMBULATORY_CARE_PROVIDER_SITE_OTHER): Payer: Self-pay | Admitting: Family Medicine

## 2023-04-27 VITALS — BP 126/84 | HR 71 | Temp 98.1°F | Ht 67.0 in

## 2023-04-27 DIAGNOSIS — R739 Hyperglycemia, unspecified: Secondary | ICD-10-CM | POA: Diagnosis not present

## 2023-04-27 DIAGNOSIS — I10 Essential (primary) hypertension: Secondary | ICD-10-CM | POA: Diagnosis not present

## 2023-04-27 DIAGNOSIS — Z6841 Body Mass Index (BMI) 40.0 and over, adult: Secondary | ICD-10-CM

## 2023-04-27 DIAGNOSIS — E669 Obesity, unspecified: Secondary | ICD-10-CM

## 2023-04-27 MED ORDER — AMLODIPINE BESYLATE 5 MG PO TABS: 5.0000 mg | ORAL_TABLET | Freq: Every day | ORAL | 1 refills | Status: DC

## 2023-04-27 MED ORDER — OZEMPIC (0.25 OR 0.5 MG/DOSE) 2 MG/3ML ~~LOC~~ SOPN
0.2500 mg | PEN_INJECTOR | SUBCUTANEOUS | 0 refills | Status: DC
Start: 2023-04-27 — End: 2023-04-27

## 2023-04-27 MED ORDER — OZEMPIC (0.25 OR 0.5 MG/DOSE) 2 MG/3ML ~~LOC~~ SOPN: 0.2500 mg | PEN_INJECTOR | SUBCUTANEOUS | 0 refills | Status: DC

## 2023-04-27 NOTE — Progress Notes (Unsigned)
Chief Complaint:   OBESITY Meredith Wright is here to discuss her progress with her obesity treatment plan along with follow-up of her obesity related diagnoses. Meredith Wright is on keeping a food journal and adhering to recommended goals of 1400-1700 calories and 100 protein and states she is following her eating plan approximately 50% of the time. Meredith Wright states she is not exercising.  Today's visit was #: 9 Starting weight: 308 lb Starting date: 11/30/2022 Today's weight: patient in a boot today, could not take off, no weight to report (last weight 306 lb)  Today's date: 04/27/2023 Total lbs lost to date: 2 lb Total lbs lost since last in-office visit: unable to determine, due to patient wearing a boot today.   Interim History: Patient in a boot for 8 weeks due to ankle injury. She is going to follow up in 8 weeks. She could not take her boot off for a weight today.  Average calories per week is 1978, 1814, 1675, 1532 for the last 4 weeks.  She has been getting in 119, 127, 107, 96g protein daily average over the last 4 weeks.  For July 4th she isn't sure what she will be doing.    Subjective:   1. Primary hypertension Patient blood pressure controlled today.  Patient denies chest pain, chest pressure, headache.  2. Elevated blood sugar level Patient blood sugars in the 110s to 120s.  Patient is not taking any medications.  Patient may be interested in exploring medication options for control.  Assessment/Plan:   1. Primary hypertension Refill- amLODipine (NORVASC) 5 MG tablet; Take 1 tablet (5 mg total) by mouth daily.  Dispense: 90 tablet; Refill: 1  2. Elevated blood sugar level Start- Semaglutide,0.25 or 0.5MG /DOS, (OZEMPIC, 0.25 OR 0.5 MG/DOSE,) 2 MG/3ML SOPN; Inject 0.25 mg into the skin once a week.  Dispense: 3 mL; Refill: 0  3. BMI 45.0-49.9, adult (HCC) Meredith Wright is currently in the action stage of change. As such, her goal is to continue with weight loss efforts. She has agreed to keeping a  food journal and adhering to recommended goals of 1650-1800 calories and 125+ protein daily.   Exercise goals: All adults should avoid inactivity. Some physical activity is better than none, and adults who participate in any amount of physical activity gain some health benefits.  Behavioral modification strategies: increasing lean protein intake, meal planning and cooking strategies, keeping healthy foods in the home, and planning for success.  Meredith Wright has agreed to follow-up with our clinic in 3-4 weeks. She was informed of the importance of frequent follow-up visits to maximize her success with intensive lifestyle modifications for her multiple health conditions.   Objective:   Blood pressure 126/84, pulse 71, temperature 98.1 F (36.7 C), height 5\' 7"  (1.702 m), SpO2 97 %. Body mass index is 47.93 kg/m.  General: Cooperative, alert, well developed, in no acute distress. HEENT: Conjunctivae and lids unremarkable. Cardiovascular: Regular rhythm.  Lungs: Normal work of breathing. Neurologic: No focal deficits.   Lab Results  Component Value Date   CREATININE 0.81 11/30/2022   BUN 17 11/30/2022   NA 142 11/30/2022   K 4.0 11/30/2022   CL 103 11/30/2022   CO2 21 11/30/2022   Lab Results  Component Value Date   ALT 22 11/30/2022   AST 16 11/30/2022   ALKPHOS 142 (H) 11/30/2022   BILITOT 0.4 11/30/2022   Lab Results  Component Value Date   HGBA1C 6.1 (H) 11/30/2022   Lab Results  Component Value  Date   INSULIN 13.7 11/30/2022   Lab Results  Component Value Date   TSH 1.520 11/30/2022   Lab Results  Component Value Date   CHOL 243 (H) 11/30/2022   HDL 42 11/30/2022   LDLCALC 157 (H) 11/30/2022   TRIG 236 (H) 11/30/2022   CHOLHDL 5.4 (H) 06/13/2022   Lab Results  Component Value Date   VD25OH 11.7 (L) 11/30/2022   Lab Results  Component Value Date   WBC 12.4 (H) 11/30/2022   HGB 13.3 11/30/2022   HCT 40.3 11/30/2022   MCV 83 11/30/2022   PLT 364 11/30/2022    Lab Results  Component Value Date   IRON 43 06/06/2013   TIBC 328 06/06/2013   Attestation Statements:   Reviewed by clinician on day of visit: allergies, medications, problem list, medical history, surgical history, family history, social history, and previous encounter notes.  I, Malcolm Metro, RMA, am acting as transcriptionist for Reuben Likes, MD.  I have reviewed the above documentation for accuracy and completeness, and I agree with the above. - Reuben Likes, MD

## 2023-04-28 ENCOUNTER — Other Ambulatory Visit (INDEPENDENT_AMBULATORY_CARE_PROVIDER_SITE_OTHER): Payer: Self-pay | Admitting: Family Medicine

## 2023-04-28 DIAGNOSIS — E559 Vitamin D deficiency, unspecified: Secondary | ICD-10-CM

## 2023-05-01 NOTE — Therapy (Signed)
OUTPATIENT PHYSICAL THERAPY LOWER EXTREMITYTREATMENT   Patient Name: Meredith Wright MRN: [id number] DOB:1971/01/16, 52 y.o., female Today's Date: 05/02/2023  END OF SESSION:  PT End of Session - 05/02/23 1011     Visit Number 2    Date for PT Re-Evaluation 06/21/23    Authorization Type [employer]IC[employer]    PT Start Time 1015    PT Stop Time 1103    PT Time Calculation (min) 48 min    Activity Tolerance Treatment limited secondary to medical complications (Comment)    Behavior During Therapy WFL for tasks assessed/performed             Past Medical History:  Diagnosis Date   Arthritis    Joint pain    Multiple food allergies    Multiple sclerosis (HCC)    Sickle cell anemia (HCC)    trait   Past Surgical History:  Procedure Laterality Date   BRAIN SURGERY  11/07/1996   Biopsy -Dx MS   CESAREAN SECTION     Patient Active Problem List   Diagnosis Date Noted   Achilles tendinitis of left lower extremity 04/04/2023   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   Prediabetes 03/02/2023   Vitamin D deficiency 03/02/2023   Dietary counseling and surveillance 06/08/2022   Hypopigmentation 06/08/2022   Severe obesity (BMI >= 40) (HCC) 05/25/2022   Food allergy 05/04/2022   Lower extremity edema 03/22/2022   Bilateral hand pain 12/27/2021   Trigger thumb of left hand 12/27/2021   Essential hypertension 04/27/2021   Osteoarthritis of left knee 02/25/2021   Knee pain, bilateral 01/15/2021   BMI 40.0-44.9, adult (HCC) 01/15/2021   Sickle cell trait (HCC) 04/08/2016   DUB (dysfunctional uterine bleeding) 04/01/2014   Multiple sclerosis (HCC)     PCP: [place], Meredith Wright   REFERRING PROVIDER: [place], Meredith Wright  REFERRING DIAG: [phone] (ICD-10-CM) - Achilles tendinitis of left lower extremity  THERAPY DIAG:  Pain in left ankle and joints of left foot  Difficulty in walking, not elsewhere classified  Muscle weakness (generalized)  Repeated  falls  Rationale for Evaluation and Treatment: Rehabilitation  ONSET DATE: 04/04/2023  SUBJECTIVE:   SUBJECTIVE STATEMENT: Sore in left achilles. I've been doing by exercises and got the foot   Eval:Patient reports long history of MS and OA.  I was diagnosed with MS in my 20's.  I am disabled.  I like to work out at home but Wright said not to workout due to my ankle.  Pain in left achilles for several months.  She is currently in a walking boot and in night splint at night.  She has pain at the distal achilles at the attachment site.  She hopes to be able to resume exercise and walk without pain.   PERTINENT HISTORY: MS and OA  PAIN:  Are you having pain?  Minimal at rest, at worst 8/10  PRECAUTIONS: Other: MS  WEIGHT BEARING RESTRICTIONS: No  FALLS:  Has patient fallen in last 6 months? Yes. Number of falls March, collapsed from being overheated.  She states overheating with MS patients cause [place] to collapse.   LIVING ENVIRONMENT: Lives with: lives with their spouse Lives in: House/apartment Stairs: Yes: Internal: 3 steps; on right going up and External: 3 steps; on right going up Has following equipment at home: Quad cane small base  OCCUPATION: Disabled  PLOF: Independent, Independent with household mobility with device, Independent with community mobility with device, Independent with homemaking with ambulation, Independent  with gait, and Independent with transfers  PATIENT GOALS: to be able to walk without pain and workout  NEXT Wright VISIT: Aug 1  OBJECTIVE:   DIAGNOSTIC FINDINGS: Spur left achilles  PATIENT SURVEYS:  LEFS 44/80  COGNITION: Overall cognitive status: Within functional limits for tasks assessed     SENSATION: WFL   POSTURE:  pes planus and valgus knee  PALPATION: Tender at distal achilles left 05/02/23 tender along lateral malleolus ligaments and peroneal tendons; no pain with MMT  LOWER EXTREMITY ROM:  Active ROM Right eval Left eval  Hip  flexion    Hip extension    Hip abduction    Hip adduction    Hip internal rotation    Hip external rotation    Knee flexion    Knee extension    Ankle dorsiflexion  10  Ankle plantarflexion  48  Ankle inversion  28  Ankle eversion  12   (Blank rows = not tested)  LOWER EXTREMITY MMT:  MMT Right eval Left 05/02/23  Hip flexion    Hip extension    Hip abduction    Hip adduction    Hip internal rotation    Hip external rotation    Knee flexion    Knee extension    Ankle dorsiflexion  4+  Ankle plantarflexion    Ankle inversion  4+  Ankle eversion  4   (Blank rows = not tested)   FUNCTIONAL TESTS:  5 times sit to stand: 19.41 sec Timed up and go (TUG): 14.32  GAIT: Distance walked: 30 Assistive device utilized: Quad cane small base Level of assistance: Modified independence Comments: antalgic   TODAY'S TREATMENT:                                                                                                                              DATE:  05/02/23 Nustep L5 x 5 min Gastroc/soleus stretch at bars 3x30 sec ea Seated alphabet, heel raise and toe raise x 10 ea, circles CW/CCW x 10 ea Rocker board x 10 B heel raise x 5 patient could feel pain AROM Inv/Ever post manual Manual: MMT, IASTM to distal gastroc/peroneals and achilles tendon; STM to distal peroneals and trans-friction massage to tendons and ligaments around lateral malleolus; talofib and proximal tib/fib mobs    04/26/23 Initial eval completed and initiated HEP    PATIENT EDUCATION:  Education details: updated HEP Person educated: Patient Education method: Programmer, multimedia, Demonstration, Verbal cues, and Handouts Education comprehension: verbalized understanding, returned demonstration, and verbal cues required  HOME EXERCISE PROGRAM: Access Code: [email or web address] URL: [email or web address] Date: 05/02/2023 Prepared by: Raynelle Fanning  Exercises - Seated Ankle Alphabet  - 1 x daily - 7 x weekly - 1  sets - 1 reps - a-z hold - Seated Heel Raise  - 1 x daily - 7 x weekly - 1 sets - 20 reps - Seated Toe Raise  - 1 x daily - 7 x weekly - 3  sets - 10 reps - Gastroc Stretch on Wall  - 1 x daily - 7 x weekly - 3 sets - 10 reps - Soleus Stretch on Wall  - 1 x daily - 7 x weekly - 3 sets - 10 reps - Seated Ankle Circles  - 2 x daily - 7 x weekly - 2 sets - 10 reps  ASSESSMENT:  CLINICAL IMPRESSION: Meredith Wright presents today in walking boot on L foot and Evenup boot on R and without AD. She is compliant with HEP and says exercises are helping. She had some pain with standing heel raises so held these. Good response to STM and mobs. She had increased popping in ankle with inv/ever post manual therapy but reported it felt good. Her pain appears more consistent with a lateral ankle sprain today compared to achilles tendinitis, but likely both involved due to MOI. Meredith Wright continues to demonstrate potential for improvement and would benefit from continued skilled therapy to address impairments.    OBJECTIVE IMPAIRMENTS: Abnormal gait, decreased balance, decreased endurance, decreased knowledge of use of DME, difficulty walking, decreased ROM, decreased strength, hypomobility, increased fascial restrictions, increased muscle spasms, impaired flexibility, obesity, and pain.   ACTIVITY LIMITATIONS: carrying, lifting, bending, standing, squatting, sleeping, stairs, transfers, and dressing  PARTICIPATION LIMITATIONS: meal prep, cleaning, laundry, shopping, community activity, yard work, and church  PERSONAL FACTORS: Fitness, Past/current experiences, and 3+ comorbidities: MS, OA, and obesity  are also affecting patient's functional outcome.   REHAB POTENTIAL: Fair due to MS and obesity, treatment may be limited  CLINICAL DECISION MAKING: Evolving/moderate complexity  EVALUATION COMPLEXITY: Moderate   GOALS: Goals reviewed with patient? Yes  SHORT TERM GOALS: Target date: 05/24/2023  Pain report to be no  greater than 4/10  Baseline: Goal status: INITIAL  2.  Patient will be independent with initial HEP  Baseline:  Goal status: INITIAL  3.  Patient to be able to walk with boot without cane safely Baseline:  Goal status: INITIAL   LONG TERM GOALS: Target date: 06/21/2023   Patient to report pain no greater than 2/10  Baseline:  Goal status: INITIAL  2.  Patient to be independent with advanced HEP  Baseline:  Goal status: INITIAL  3.  Functional scores to improve by 2-3 seconds Baseline:  Goal status: INITIAL  4.  LEFS score to improve to 50-54 Baseline:  Goal status: INITIAL  5.  Patient to be able to ambulate with normal heel to toe progression without boot with minimal pain no greater than 1/10 Baseline:  Goal status: INITIAL  6.  Patient to be able to resume prior exercise routine Baseline:  Goal status: INITIAL   PLAN:  PT FREQUENCY: 2x/week  PT DURATION: 8 weeks  PLANNED INTERVENTIONS: Therapeutic exercises, Therapeutic activity, Neuromuscular re-education, Balance training, Gait training, Patient/Family education, Self Care, Joint mobilization, Stair training, Orthotic/Fit training, DME instructions, Aquatic Therapy, Dry Needling, Electrical stimulation, Cryotherapy, Splintting, Taping, Ultrasound, Ionotophoresis 4mg /ml Dexamethasone, Manual therapy, and Re-evaluation  PLAN FOR NEXT SESSION: Nustep, manual ROM and possible IASTM. Continue with fibular mobs as indicated,  Ice if painful post session.    Solon Palm, PT 05/02/23 2:24 PM [facility] [address] [place], [place] [address] Phone # [phone] Fax [phone]

## 2023-05-02 ENCOUNTER — Encounter: Payer: Self-pay | Admitting: Physical Therapy

## 2023-05-02 ENCOUNTER — Encounter (INDEPENDENT_AMBULATORY_CARE_PROVIDER_SITE_OTHER): Payer: Self-pay

## 2023-05-02 ENCOUNTER — Ambulatory Visit: Payer: 59 | Admitting: Physical Therapy

## 2023-05-02 DIAGNOSIS — M25572 Pain in left ankle and joints of left foot: Secondary | ICD-10-CM | POA: Diagnosis not present

## 2023-05-02 DIAGNOSIS — R262 Difficulty in walking, not elsewhere classified: Secondary | ICD-10-CM

## 2023-05-02 DIAGNOSIS — M6281 Muscle weakness (generalized): Secondary | ICD-10-CM

## 2023-05-02 DIAGNOSIS — R296 Repeated falls: Secondary | ICD-10-CM

## 2023-05-03 NOTE — Therapy (Signed)
OUTPATIENT PHYSICAL THERAPY LOWER EXTREMITYTREATMENT   Patient Name: Meredith Wright MRN: [id number] DOB:03/05/1971, 52 y.o., female Today's Date: 05/04/2023  END OF SESSION:  PT End of Session - 05/04/23 0933     Visit Number 3    Date for PT Re-Evaluation 06/21/23    Authorization Type [employer]ICARE    PT Start Time [phone]    PT Stop Time 1018    PT Time Calculation (min) 45 min    Activity Tolerance Treatment limited secondary to medical complications (Comment)    Behavior During Therapy [facility] for tasks assessed/performed             Past Medical History:  Diagnosis Date   Arthritis    Joint pain    Multiple food allergies    Multiple sclerosis (HCC)    Sickle cell anemia (HCC)    trait   Past Surgical History:  Procedure Laterality Date   BRAIN SURGERY  11/07/1996   Biopsy -Dx MS   CESAREAN SECTION     Patient Active Problem List   Diagnosis Date Noted   Achilles tendinitis of left lower extremity 04/04/2023   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   Prediabetes 03/02/2023   Vitamin D deficiency 03/02/2023   Dietary counseling and surveillance 06/08/2022   Hypopigmentation 06/08/2022   Severe obesity (BMI >= 40) (HCC) 05/25/2022   Food allergy 05/04/2022   Lower extremity edema 03/22/2022   Bilateral hand pain 12/27/2021   Trigger thumb of left hand 12/27/2021   Essential hypertension 04/27/2021   Osteoarthritis of left knee 02/25/2021   Knee pain, bilateral 01/15/2021   BMI 40.0-44.9, adult (HCC) 01/15/2021   Sickle cell trait (HCC) 04/08/2016   DUB (dysfunctional uterine bleeding) 04/01/2014   Multiple sclerosis (HCC)     PCP: de [place], Raymond J, MD   REFERRING PROVIDER: [place], Raymond J, MD  REFERRING DIAG: [id number] (ICD-10-CM) - Achilles tendinitis of left lower extremity  THERAPY DIAG:  Pain in left ankle and joints of left foot  Difficulty in walking, not elsewhere classified  Muscle weakness (generalized)  Repeated  falls  Rationale for Evaluation and Treatment: Rehabilitation  ONSET DATE: 04/04/2023  SUBJECTIVE:   SUBJECTIVE STATEMENT: It felt good after last time.  Eval:Patient reports long history of MS and OA.  I was diagnosed with MS in my 20's.  I am disabled.  I like to work out at home but MD said not to workout due to my ankle.  Pain in left achilles for several months.  She is currently in a walking boot and in night splint at night.  She has pain at the distal achilles at the attachment site.  She hopes to be able to resume exercise and walk without pain.   PERTINENT HISTORY: MS and OA  PAIN:  Are you having pain?  Minimal at rest, at worst 8/10  PRECAUTIONS: Other: MS  WEIGHT BEARING RESTRICTIONS: No  FALLS:  Has patient fallen in last 6 months? Yes. Number of falls March, collapsed from being overheated.  She states overheating with MS patients cause [place] to collapse.   LIVING ENVIRONMENT: Lives with: lives with their spouse Lives in: House/apartment Stairs: Yes: Internal: 3 steps; on right going up and External: 3 steps; on right going up Has following equipment at home: Quad cane small base  OCCUPATION: Disabled  PLOF: Independent, Independent with household mobility with device, Independent with community mobility with device, Independent with homemaking with ambulation, Independent with gait, and Independent with transfers  PATIENT  GOALS: to be able to walk without pain and workout  NEXT MD VISIT: Aug 1  OBJECTIVE:   DIAGNOSTIC FINDINGS: Spur left achilles  PATIENT SURVEYS:  LEFS 44/80  COGNITION: Overall cognitive status: Within functional limits for tasks assessed     SENSATION: WFL   POSTURE:  pes planus and valgus knee  PALPATION: Tender at distal achilles left 05/02/23 tender along lateral malleolus ligaments and peroneal tendons; no pain with MMT  LOWER EXTREMITY ROM:  Active ROM Right eval Left eval  Hip flexion    Hip extension    Hip  abduction    Hip adduction    Hip internal rotation    Hip external rotation    Knee flexion    Knee extension    Ankle dorsiflexion  10  Ankle plantarflexion  48  Ankle inversion  28  Ankle eversion  12   (Blank rows = not tested)  LOWER EXTREMITY MMT:  MMT Right eval Left 05/02/23  Hip flexion    Hip extension    Hip abduction    Hip adduction    Hip internal rotation    Hip external rotation    Knee flexion    Knee extension    Ankle dorsiflexion  4+  Ankle plantarflexion    Ankle inversion  4+  Ankle eversion  4   (Blank rows = not tested)   FUNCTIONAL TESTS:  5 times sit to stand: 19.41 sec Timed up and go (TUG): 14.32  GAIT: Distance walked: 30 Assistive device utilized: Quad cane small base Level of assistance: Modified independence Comments: antalgic   TODAY'S TREATMENT:                                                                                                                              DATE:  05/04/23 Nustep L5 x 5 min Gastroc/soleus stretch at bars 3x30 sec ea Seated inv/ever, heel raise and toe raise x 10 ea, circles CW/CCW x 10 ea Rocker board x 10 LS x 30 sec Manual:  talofib and proximal tib/fib mobs  Ionotophoresis with 1.0 mL 4mg /ml Dexamethasone - 4-6 hour patch (35mA-min) to L lateral ankle (patch #1 of 6)   05/02/23 Nustep L5 x 5 min Gastroc/soleus stretch at bars 3x30 sec ea Seated alphabet, heel raise and toe raise x 10 ea, circles CW/CCW x 10 ea Rocker board x 10 B heel raise x 5 patient could feel pain AROM Inv/Ever post manual Manual: MMT, IASTM to distal gastroc/peroneals and achilles tendon; STM to distal peroneals and trans-friction massage to tendons and ligaments around lateral malleolus; talofib and proximal tib/fib mobs    04/26/23 Initial eval completed and initiated HEP    PATIENT EDUCATION:  Education details: updated HEP Person educated: Patient Education method: Programmer, multimedia, Demonstration, Verbal cues, and  Handouts Education comprehension: verbalized understanding, returned demonstration, and verbal cues required  HOME EXERCISE PROGRAM: Access Code: [email or web address] URL: [email or web address] Date: 05/04/2023 Prepared by: Raynelle Fanning  Exercises - Seated Ankle Alphabet  - 1 x daily - 7 x weekly - 1 sets - 1 reps - a-z hold - Seated Heel Raise  - 1 x daily - 7 x weekly - 1 sets - 20 reps - Seated Toe Raise  - 1 x daily - 7 x weekly - 3 sets - 10 reps - Gastroc Stretch on Wall  - 1 x daily - 7 x weekly - 3 sets - 10 reps - Soleus Stretch on Wall  - 1 x daily - 7 x weekly - 3 sets - 10 reps - Seated Ankle Circles  - 2 x daily - 7 x weekly - 2 sets - 10 reps  Patient Education - [place] Pt Instructions - [facility]  ASSESSMENT:  CLINICAL IMPRESSION: Tari is tolerating PT without increased pain. Initial trial of ionto to lateral left ankle today as this continues to be her main area of pain. She would benefit from band exercises and BAPS next visit as tolerated.    OBJECTIVE IMPAIRMENTS: Abnormal gait, decreased balance, decreased endurance, decreased knowledge of use of DME, difficulty walking, decreased ROM, decreased strength, hypomobility, increased fascial restrictions, increased muscle spasms, impaired flexibility, obesity, and pain.   ACTIVITY LIMITATIONS: carrying, lifting, bending, standing, squatting, sleeping, stairs, transfers, and dressing  PARTICIPATION LIMITATIONS: meal prep, cleaning, laundry, shopping, community activity, yard work, and church  PERSONAL FACTORS: Fitness, Past/current experiences, and 3+ comorbidities: MS, OA, and obesity  are also affecting patient's functional outcome.   REHAB POTENTIAL: Fair due to MS and obesity, treatment may be limited  CLINICAL DECISION MAKING: Evolving/moderate complexity  EVALUATION COMPLEXITY: Moderate   GOALS: Goals reviewed with patient? Yes  SHORT TERM GOALS: Target date: 05/24/2023  Pain report to be no greater than 4/10   Baseline: Goal status: INITIAL  2.  Patient will be independent with initial HEP  Baseline:  Goal status: INITIAL  3.  Patient to be able to walk with boot without cane safely Baseline:  Goal status: INITIAL   LONG TERM GOALS: Target date: 06/21/2023   Patient to report pain no greater than 2/10  Baseline:  Goal status: INITIAL  2.  Patient to be independent with advanced HEP  Baseline:  Goal status: INITIAL  3.  Functional scores to improve by 2-3 seconds Baseline:  Goal status: INITIAL  4.  LEFS score to improve to 50-54 Baseline:  Goal status: INITIAL  5.  Patient to be able to ambulate with normal heel to toe progression without boot with minimal pain no greater than 1/10 Baseline:  Goal status: INITIAL  6.  Patient to be able to resume prior exercise routine Baseline:  Goal status: INITIAL   PLAN:  PT FREQUENCY: 2x/week  PT DURATION: 8 weeks  PLANNED INTERVENTIONS: Therapeutic exercises, Therapeutic activity, Neuromuscular re-education, Balance training, Gait training, Patient/Family education, Self Care, Joint mobilization, Stair training, Orthotic/Fit training, DME instructions, Aquatic Therapy, Dry Needling, Electrical stimulation, Cryotherapy, Splintting, Taping, Ultrasound, Ionotophoresis 4mg /ml Dexamethasone, Manual therapy, and Re-evaluation  PLAN FOR NEXT SESSION: Nustep, manual ROM and possible IASTM. Continue with fibular mobs as indicated,  Ice if painful post session.    Solon Palm, PT 05/04/23 1:57 PM [facility] [address] [place], [place] [address] Phone # [phone] Fax [phone]

## 2023-05-04 ENCOUNTER — Encounter: Payer: Self-pay | Admitting: Physical Therapy

## 2023-05-04 ENCOUNTER — Ambulatory Visit: Payer: 59 | Admitting: Physical Therapy

## 2023-05-04 DIAGNOSIS — M25572 Pain in left ankle and joints of left foot: Secondary | ICD-10-CM

## 2023-05-04 DIAGNOSIS — R262 Difficulty in walking, not elsewhere classified: Secondary | ICD-10-CM

## 2023-05-04 DIAGNOSIS — M6281 Muscle weakness (generalized): Secondary | ICD-10-CM

## 2023-05-04 DIAGNOSIS — R296 Repeated falls: Secondary | ICD-10-CM

## 2023-05-08 ENCOUNTER — Ambulatory Visit: Payer: 59 | Attending: Family Medicine

## 2023-05-08 DIAGNOSIS — M6281 Muscle weakness (generalized): Secondary | ICD-10-CM | POA: Diagnosis present

## 2023-05-08 DIAGNOSIS — M25572 Pain in left ankle and joints of left foot: Secondary | ICD-10-CM | POA: Insufficient documentation

## 2023-05-08 DIAGNOSIS — G8929 Other chronic pain: Secondary | ICD-10-CM | POA: Insufficient documentation

## 2023-05-08 DIAGNOSIS — R252 Cramp and spasm: Secondary | ICD-10-CM | POA: Diagnosis present

## 2023-05-08 DIAGNOSIS — M25562 Pain in left knee: Secondary | ICD-10-CM | POA: Diagnosis present

## 2023-05-08 DIAGNOSIS — R262 Difficulty in walking, not elsewhere classified: Secondary | ICD-10-CM | POA: Insufficient documentation

## 2023-05-08 DIAGNOSIS — M25561 Pain in right knee: Secondary | ICD-10-CM | POA: Insufficient documentation

## 2023-05-08 DIAGNOSIS — R296 Repeated falls: Secondary | ICD-10-CM | POA: Diagnosis present

## 2023-05-08 NOTE — Therapy (Signed)
OUTPATIENT PHYSICAL THERAPY LOWER EXTREMITYTREATMENT   Patient Name: Meredith Wright MRN: [id number] DOB:[birth year], 52 y.o., female Today's Date: 05/08/2023  END OF SESSION:  PT End of Session - 05/08/23 1152     Visit Number 4    Date for PT Re-Evaluation 06/21/23    Authorization Type [employer]    PT Start Time 1148    PT Stop Time 1230    PT Time Calculation (min) 42 min    Activity Tolerance Treatment limited secondary to medical complications (Comment)    Behavior During Therapy WFL for tasks assessed/performed             Past Medical History:  Diagnosis Date   Arthritis    Joint pain    Multiple food allergies    Multiple sclerosis (HCC)    Sickle cell anemia (HCC)    trait   Past Surgical History:  Procedure Laterality Date   BRAIN SURGERY  11/07/1996   Biopsy -Dx MS   CESAREAN SECTION     Patient Active Problem List   Diagnosis Date Noted   Achilles tendinitis of left lower extremity 04/04/2023   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   Prediabetes 03/02/2023   Vitamin D deficiency 03/02/2023   Dietary counseling and surveillance 06/08/2022   Hypopigmentation 06/08/2022   Severe obesity (BMI >= 40) (HCC) 05/25/2022   Food allergy 05/04/2022   Lower extremity edema 03/22/2022   Bilateral hand pain 12/27/2021   Trigger thumb of left hand 12/27/2021   Essential hypertension 04/27/2021   Osteoarthritis of left knee 02/25/2021   Knee pain, bilateral 01/15/2021   BMI 40.0-44.9, adult (HCC) 01/15/2021   Sickle cell trait (HCC) 04/08/2016   DUB (dysfunctional uterine bleeding) 04/01/2014   Multiple sclerosis (HCC)     PCP: de [place], Raymond J, MD   REFERRING PROVIDER: de [place], Raymond J, MD  REFERRING DIAG: [phone] (ICD-10-CM) - Achilles tendinitis of left lower extremity  THERAPY DIAG:  Pain in left ankle and joints of left foot  Difficulty in walking, not elsewhere classified  Muscle weakness (generalized)  Repeated falls  Chronic  pain of left knee  Rationale for Evaluation and Treatment: Rehabilitation  ONSET DATE: 04/04/2023  SUBJECTIVE:   SUBJECTIVE STATEMENT: I'm doing pretty good with regard to pain.  My pain is 0/10 today.  I just don't have good balance and have tripped a couple of times. It's usually when I am outside, its hot and I get tired from my MS and I am not picking up my feet.    Eval:Patient reports long history of MS and OA.  I was diagnosed with MS in my 20's.  I am disabled.  I like to work out at home but MD said not to workout due to my ankle.  Pain in left achilles for several months.  She is currently in a walking boot and in night splint at night.  She has pain at the distal achilles at the attachment site.  She hopes to be able to resume exercise and walk without pain.   PERTINENT HISTORY: MS and OA  PAIN:  Are you having pain?  0/10  PRECAUTIONS: Other: MS  WEIGHT BEARING RESTRICTIONS: No  FALLS:  Has patient fallen in last 6 months? Yes. Number of falls March, collapsed from being overheated.  She states overheating with MS patients cause [place] to collapse.   LIVING ENVIRONMENT: Lives with: lives with their spouse Lives in: House/apartment Stairs: Yes: Internal: 3 steps; on right going up and  External: 3 steps; on right going up Has following equipment at home: Quad cane small base  OCCUPATION: Disabled  PLOF: Independent, Independent with household mobility with device, Independent with community mobility with device, Independent with homemaking with ambulation, Independent with gait, and Independent with transfers  PATIENT GOALS: to be able to walk without pain and workout  NEXT MD VISIT: Aug 1  OBJECTIVE:   DIAGNOSTIC FINDINGS: Spur left achilles  PATIENT SURVEYS:  LEFS 44/80  COGNITION: Overall cognitive status: Within functional limits for tasks assessed     SENSATION: WFL   POSTURE:  pes planus and valgus knee  PALPATION: Tender at distal achilles  left 05/02/23 tender along lateral malleolus ligaments and peroneal tendons; no pain with MMT  LOWER EXTREMITY ROM:  Active ROM Right eval Left eval  Hip flexion    Hip extension    Hip abduction    Hip adduction    Hip internal rotation    Hip external rotation    Knee flexion    Knee extension    Ankle dorsiflexion  10  Ankle plantarflexion  48  Ankle inversion  28  Ankle eversion  12   (Blank rows = not tested)  LOWER EXTREMITY MMT:  MMT Right eval Left 05/02/23  Hip flexion    Hip extension    Hip abduction    Hip adduction    Hip internal rotation    Hip external rotation    Knee flexion    Knee extension    Ankle dorsiflexion  4+  Ankle plantarflexion    Ankle inversion  4+  Ankle eversion  4   (Blank rows = not tested)   FUNCTIONAL TESTS:  5 times sit to stand: 19.41 sec Timed up and go (TUG): 14.32  GAIT: Distance walked: 30 Assistive device utilized: Quad cane small base Level of assistance: Modified independence Comments: antalgic   TODAY'S TREATMENT:                                                                                                                              DATE:  05/08/23 Nustep L5 x 5 min Seated toe and heel raises x 20 Circle BAPS board: DF/PF x 20, cw x 20, ccw x 20 Rocker board x 2 min Rocker board propped for gastroc stretch x 10 holding 10 sec Rocker board propped for soleus stretch x 10 holding 10 sec left only Step up and hold x 10 fwd and then lateral (left) Alternating cone tap ups x 20 Seated towel push for inversion/eversion x 10 Seated toe scrunch x 10 Seated 4 way ankle tband x 20 each (yellow)  Ionotophoresis with 1.0 mL 4mg /ml Dexamethasone - 4-6 hour patch (35mA-min) to L lateral ankle (patch #2 of 6)  DATE:  05/04/23 Nustep L5 x 5 min Gastroc/soleus stretch at bars 3x30 sec ea Seated inv/ever, heel raise and toe raise x 10 ea, circles CW/CCW x 10 ea Rocker board x 10  LS x 30 sec Manual:  talofib and  proximal tib/fib mobs  Ionotophoresis with 1.0 mL 4mg /ml Dexamethasone - 4-6 hour patch (76mA-min) to L lateral ankle (patch #1 of 6)   05/02/23 Nustep L5 x 5 min Gastroc/soleus stretch at bars 3x30 sec ea Seated alphabet, heel raise and toe raise x 10 ea, circles CW/CCW x 10 ea Rocker board x 10 B heel raise x 5 patient could feel pain AROM Inv/Ever post manual Manual: MMT, IASTM to distal gastroc/peroneals and achilles tendon; STM to distal peroneals and trans-friction massage to tendons and ligaments around lateral malleolus; talofib and proximal tib/fib mobs    04/26/23 Initial eval completed and initiated HEP    PATIENT EDUCATION:  Education details: updated HEP Person educated: Patient Education method: Programmer, multimedia, Facilities manager, Verbal cues, and Handouts Education comprehension: verbalized understanding, returned demonstration, and verbal cues required  HOME EXERCISE PROGRAM: Access Code: [email or web address] URL: [email or web address] Date: 05/04/2023 Prepared by: Raynelle Fanning  Exercises - Seated Ankle Alphabet  - 1 x daily - 7 x weekly - 1 sets - 1 reps - a-z hold - Seated Heel Raise  - 1 x daily - 7 x weekly - 1 sets - 20 reps - Seated Toe Raise  - 1 x daily - 7 x weekly - 3 sets - 10 reps - Gastroc Stretch on Wall  - 1 x daily - 7 x weekly - 3 sets - 10 reps - Soleus Stretch on Wall  - 1 x daily - 7 x weekly - 3 sets - 10 reps - Seated Ankle Circles  - 2 x daily - 7 x weekly - 2 sets - 10 reps  Patient Education - Ionto Pt Instructions - [facility]  ASSESSMENT:  CLINICAL IMPRESSION: Meredith Wright is progressing appropriately.  She reported no pain upon entering clinic today.  She was able to complete all tasks today with good tolerance and no increased pain.  She would benefit from continuing skilled PT for left ankle stability and strengthening along with balance training.     OBJECTIVE IMPAIRMENTS: Abnormal gait, decreased balance, decreased endurance, decreased knowledge of  use of DME, difficulty walking, decreased ROM, decreased strength, hypomobility, increased fascial restrictions, increased muscle spasms, impaired flexibility, obesity, and pain.   ACTIVITY LIMITATIONS: carrying, lifting, bending, standing, squatting, sleeping, stairs, transfers, and dressing  PARTICIPATION LIMITATIONS: meal prep, cleaning, laundry, shopping, community activity, yard work, and church  PERSONAL FACTORS: Fitness, Past/current experiences, and 3+ comorbidities: MS, OA, and obesity  are also affecting patient's functional outcome.   REHAB POTENTIAL: Fair due to MS and obesity, treatment may be limited  CLINICAL DECISION MAKING: Evolving/moderate complexity  EVALUATION COMPLEXITY: Moderate   GOALS: Goals reviewed with patient? Yes  SHORT TERM GOALS: Target date: 05/24/2023  Pain report to be no greater than 4/10  Baseline: Goal status: INITIAL  2.  Patient will be independent with initial HEP  Baseline:  Goal status: INITIAL  3.  Patient to be able to walk with boot without cane safely Baseline:  Goal status: INITIAL   LONG TERM GOALS: Target date: 06/21/2023   Patient to report pain no greater than 2/10  Baseline:  Goal status: INITIAL  2.  Patient to be independent with advanced HEP  Baseline:  Goal status: INITIAL  3.  Functional scores to improve by 2-3 seconds Baseline:  Goal status: INITIAL  4.  LEFS score to improve to 50-54 Baseline:  Goal status: INITIAL  5.  Patient to be able to ambulate with normal heel to toe  progression without boot with minimal pain no greater than 1/10 Baseline:  Goal status: INITIAL  6.  Patient to be able to resume prior exercise routine Baseline:  Goal status: INITIAL   PLAN:  PT FREQUENCY: 2x/week  PT DURATION: 8 weeks  PLANNED INTERVENTIONS: Therapeutic exercises, Therapeutic activity, Neuromuscular re-education, Balance training, Gait training, Patient/Family education, Self Care, Joint mobilization,  Stair training, Orthotic/Fit training, DME instructions, Aquatic Therapy, Dry Needling, Electrical stimulation, Cryotherapy, Splintting, Taping, Ultrasound, Ionotophoresis 4mg /ml Dexamethasone, Manual therapy, and Re-evaluation  PLAN FOR NEXT SESSION: Balance in SLS, static and dynamic, Nustep, manual ROM and possible IASTM. Continue with fibular mobs as indicated,  Ice if painful post session.    Victorino Dike B. Shelle Galdamez, PT 05/08/23 2:05 PM  [facility] [address], [address] [place], [place] [address] Phone # [phone] Fax [phone]

## 2023-05-09 ENCOUNTER — Ambulatory Visit: Payer: 59 | Admitting: Physical Therapy

## 2023-05-15 NOTE — Therapy (Signed)
OUTPATIENT PHYSICAL THERAPY LOWER EXTREMITYTREATMENT   Patient Name: Meredith Wright MRN: [id number] DOB:1971-02-19, 52 y.o., female Today's Date: 05/16/2023  END OF SESSION:  PT End of Session - 05/16/23 1234     Visit Number 5    Date for PT Re-Evaluation 06/21/23    Authorization Type [employer]    PT Start Time 1234    PT Stop Time 1320    PT Time Calculation (min) 46 min    Activity Tolerance Patient tolerated treatment well    Behavior During Therapy WFL for tasks assessed/performed              Past Medical History:  Diagnosis Date   Arthritis    Joint pain    Multiple food allergies    Multiple sclerosis (HCC)    Sickle cell anemia (HCC)    trait   Past Surgical History:  Procedure Laterality Date   BRAIN SURGERY  11/07/1996   Biopsy -Dx MS   CESAREAN SECTION     Patient Active Problem List   Diagnosis Date Noted   Achilles tendinitis of left lower extremity 04/04/2023   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   Prediabetes 03/02/2023   Vitamin D deficiency 03/02/2023   Dietary counseling and surveillance 06/08/2022   Hypopigmentation 06/08/2022   Severe obesity (BMI >= 40) (HCC) 05/25/2022   Food allergy 05/04/2022   Lower extremity edema 03/22/2022   Bilateral hand pain 12/27/2021   Trigger thumb of left hand 12/27/2021   Essential hypertension 04/27/2021   Osteoarthritis of left knee 02/25/2021   Knee pain, bilateral 01/15/2021   BMI 40.0-44.9, adult (HCC) 01/15/2021   Sickle cell trait (HCC) 04/08/2016   DUB (dysfunctional uterine bleeding) 04/01/2014   Multiple sclerosis (HCC)     PCP: de [place], Raymond J, MD   REFERRING PROVIDER: de [place], Raymond J, MD  REFERRING DIAG: [phone] (ICD-10-CM) - Achilles tendinitis of left lower extremity  THERAPY DIAG:  Pain in left ankle and joints of left foot  Difficulty in walking, not elsewhere classified  Muscle weakness (generalized)  Repeated falls  Rationale for Evaluation and  Treatment: Rehabilitation  ONSET DATE: 04/04/2023  SUBJECTIVE:   SUBJECTIVE STATEMENT: The patch makes a big difference.  Eval:Patient reports long history of MS and OA.  I was diagnosed with MS in my 20's.  I am disabled.  I like to work out at home but MD said not to workout due to my ankle.  Pain in left achilles for several months.  She is currently in a walking boot and in night splint at night.  She has pain at the distal achilles at the attachment site.  She hopes to be able to resume exercise and walk without pain.   PERTINENT HISTORY: MS and OA  PAIN:  Are you having pain?  0/10  PRECAUTIONS: Other: MS  WEIGHT BEARING RESTRICTIONS: No  FALLS:  Has patient fallen in last 6 months? Yes. Number of falls March, collapsed from being overheated.  She states overheating with MS patients cause [place] to collapse.   LIVING ENVIRONMENT: Lives with: lives with their spouse Lives in: House/apartment Stairs: Yes: Internal: 3 steps; on right going up and External: 3 steps; on right going up Has following equipment at home: Quad cane small base  OCCUPATION: Disabled  PLOF: Independent, Independent with household mobility with device, Independent with community mobility with device, Independent with homemaking with ambulation, Independent with gait, and Independent with transfers  PATIENT GOALS: to be able to walk without  pain and workout  NEXT MD VISIT: Aug 5, (sees DPM on Aug 1)  OBJECTIVE:   DIAGNOSTIC FINDINGS: Spur left achilles  PATIENT SURVEYS:  LEFS 44/80  COGNITION: Overall cognitive status: Within functional limits for tasks assessed     SENSATION: WFL   POSTURE:  pes planus and valgus knee  PALPATION: Tender at distal achilles left 05/02/23 tender along lateral malleolus ligaments and peroneal tendons; no pain with MMT  LOWER EXTREMITY ROM:  Active ROM Right eval Left eval  Hip flexion    Hip extension    Hip abduction    Hip adduction    Hip internal  rotation    Hip external rotation    Knee flexion    Knee extension    Ankle dorsiflexion  10  Ankle plantarflexion  48  Ankle inversion  28  Ankle eversion  12   (Blank rows = not tested)  LOWER EXTREMITY MMT:  MMT Right eval Left 05/02/23  Hip flexion    Hip extension    Hip abduction    Hip adduction    Hip internal rotation    Hip external rotation    Knee flexion    Knee extension    Ankle dorsiflexion  4+  Ankle plantarflexion    Ankle inversion  4+  Ankle eversion  4   (Blank rows = not tested)   FUNCTIONAL TESTS:  5 times sit to stand: 19.41 sec Timed up and go (TUG): 14.32  GAIT: Distance walked: 30 Assistive device utilized: Quad cane small base Level of assistance: Modified independence Comments: antalgic   TODAY'S TREATMENT:                                                                                                                              DATE:  05/16/23 Nustep L5 x 5 min Gait down long hall and back Standing arch contraction x 5 5 sec hold Standing toe raise  L x 15 Standing heel raise x 10, then with ball squeeze at ankle x 15 Alternating cone tap ups x 20 Seated 4 way ankle tband x 20 each (yellow)  STM to L ext dig brevis Ionotophoresis with 1.0 mL 4mg /ml Dexamethasone - 4-6 hour patch (22mA-min) to L post/lateral ankle (patch #3 of 6)  05/08/23 Nustep L5 x 5 min Seated toe and heel raises x 20 Circle BAPS board: DF/PF x 20, cw x 20, ccw x 20 Rocker board x 2 min Rocker board propped for gastroc stretch x 10 holding 10 sec Rocker board propped for soleus stretch x 10 holding 10 sec left only Step up and hold x 10 fwd and then lateral (left) Alternating cone tap ups x 20 Seated towel push for inversion/eversion x 10 Seated toe scrunch x 10 Seated 4 way ankle tband x 20 each (yellow)  Ionotophoresis with 1.0 mL 4mg /ml Dexamethasone - 4-6 hour patch (72mA-min) to L lateral ankle (patch #2 of 6)  DATE:  05/04/23 Nustep L5 x 5  min Gastroc/soleus stretch at bars 3x30 sec ea Seated inv/ever, heel raise and toe raise x 10 ea, circles CW/CCW x 10 ea Rocker board x 10 LS x 30 sec Manual:  talofib and proximal tib/fib mobs  Ionotophoresis with 1.0 mL 4mg /ml Dexamethasone - 4-6 hour patch (28mA-min) to L lateral ankle (patch #1 of 6)   05/02/23 Nustep L5 x 5 min Gastroc/soleus stretch at bars 3x30 sec ea Seated alphabet, heel raise and toe raise x 10 ea, circles CW/CCW x 10 ea Rocker board x 10 B heel raise x 5 patient could feel pain AROM Inv/Ever post manual Manual: MMT, IASTM to distal gastroc/peroneals and achilles tendon; STM to distal peroneals and trans-friction massage to tendons and ligaments around lateral malleolus; talofib and proximal tib/fib mobs    04/26/23 Initial eval completed and initiated HEP    PATIENT EDUCATION:  Education details: updated HEP Person educated: Patient Education method: Programmer, multimedia, Facilities manager, Verbal cues, and Handouts Education comprehension: verbalized understanding, returned demonstration, and verbal cues required  HOME EXERCISE PROGRAM: Access Code: [email or web address] URL: [email or web address] Date: 05/04/2023 Prepared by: Raynelle Fanning  Exercises - Seated Ankle Alphabet  - 1 x daily - 7 x weekly - 1 sets - 1 reps - a-z hold - Seated Heel Raise  - 1 x daily - 7 x weekly - 1 sets - 20 reps - Seated Toe Raise  - 1 x daily - 7 x weekly - 3 sets - 10 reps - Gastroc Stretch on Wall  - 1 x daily - 7 x weekly - 3 sets - 10 reps - Soleus Stretch on Wall  - 1 x daily - 7 x weekly - 3 sets - 10 reps - Seated Ankle Circles  - 2 x daily - 7 x weekly - 2 sets - 10 reps  Patient Education - Ionto Pt Instructions - [facility]  ASSESSMENT:  CLINICAL IMPRESSION: Jalisia was able to tolerate TE with her shoe on today with no complaints of increased pain. She is tender in her extensor digitorum brevis and at her achilles tendon today. Ionto applied to the latter. She did well today  with cone taps. Her gait shows significant supination B which does irritate the lateral ankle. Hayvin continues to demonstrate potential for improvement and would benefit from continued skilled therapy to address impairments.     OBJECTIVE IMPAIRMENTS: Abnormal gait, decreased balance, decreased endurance, decreased knowledge of use of DME, difficulty walking, decreased ROM, decreased strength, hypomobility, increased fascial restrictions, increased muscle spasms, impaired flexibility, obesity, and pain.   ACTIVITY LIMITATIONS: carrying, lifting, bending, standing, squatting, sleeping, stairs, transfers, and dressing  PARTICIPATION LIMITATIONS: meal prep, cleaning, laundry, shopping, community activity, yard work, and church  PERSONAL FACTORS: Fitness, Past/current experiences, and 3+ comorbidities: MS, OA, and obesity  are also affecting patient's functional outcome.   REHAB POTENTIAL: Fair due to MS and obesity, treatment may be limited  CLINICAL DECISION MAKING: Evolving/moderate complexity  EVALUATION COMPLEXITY: Moderate   GOALS: Goals reviewed with patient? Yes  SHORT TERM GOALS: Target date: 05/24/2023  Pain report to be no greater than 4/10  Baseline: Goal status: INITIAL  2.  Patient will be independent with initial HEP  Baseline:  Goal status: INITIAL  3.  Patient to be able to walk with boot without cane safely Baseline:  Goal status: INITIAL   LONG TERM GOALS: Target date: 06/21/2023   Patient to report pain no greater than 2/10  Baseline:  Goal status: INITIAL  2.  Patient to be independent with advanced HEP  Baseline:  Goal status: INITIAL  3.  Functional scores to improve by 2-3 seconds Baseline:  Goal status: INITIAL  4.  LEFS score to improve to 50-54 Baseline:  Goal status: INITIAL  5.  Patient to be able to ambulate with normal heel to toe progression without boot with minimal pain no greater than 1/10 Baseline:  Goal status: INITIAL  6.   Patient to be able to resume prior exercise routine Baseline:  Goal status: INITIAL   PLAN:  PT FREQUENCY: 2x/week  PT DURATION: 8 weeks  PLANNED INTERVENTIONS: Therapeutic exercises, Therapeutic activity, Neuromuscular re-education, Balance training, Gait training, Patient/Family education, Self Care, Joint mobilization, Stair training, Orthotic/Fit training, DME instructions, Aquatic Therapy, Dry Needling, Electrical stimulation, Cryotherapy, Splintting, Taping, Ultrasound, Ionotophoresis 4mg /ml Dexamethasone, Manual therapy, and Re-evaluation  PLAN FOR NEXT SESSION: Balance in SLS, static and dynamic, Nustep, manual ROM and possible IASTM.  Ice if painful post session.   Solon Palm, PT  05/16/23 1:42 PM  [facility] [address] [place], [place] [address] Phone # [phone] Fax [phone]

## 2023-05-16 ENCOUNTER — Ambulatory Visit: Payer: 59 | Admitting: Physical Therapy

## 2023-05-16 ENCOUNTER — Encounter: Payer: Self-pay | Admitting: Physical Therapy

## 2023-05-16 DIAGNOSIS — M25572 Pain in left ankle and joints of left foot: Secondary | ICD-10-CM

## 2023-05-16 DIAGNOSIS — M6281 Muscle weakness (generalized): Secondary | ICD-10-CM

## 2023-05-16 DIAGNOSIS — R296 Repeated falls: Secondary | ICD-10-CM

## 2023-05-16 DIAGNOSIS — R262 Difficulty in walking, not elsewhere classified: Secondary | ICD-10-CM

## 2023-05-22 NOTE — Therapy (Signed)
OUTPATIENT PHYSICAL THERAPY LOWER EXTREMITYTREATMENT   Patient Name: Meredith Wright MRN: [id number] DOB:30-Aug-1971, 52 y.o., female Today's Date: 05/23/2023  END OF SESSION:  PT End of Session - 05/23/23 1233     Visit Number 6    Date for PT Re-Evaluation 06/21/23    Authorization Type [employer]    PT Start Time 1232    PT Stop Time 1318    PT Time Calculation (min) 46 min    Activity Tolerance Patient tolerated treatment well    Behavior During Therapy WFL for tasks assessed/performed               Past Medical History:  Diagnosis Date   Arthritis    Joint pain    Multiple food allergies    Multiple sclerosis (HCC)    Sickle cell anemia (HCC)    trait   Past Surgical History:  Procedure Laterality Date   BRAIN SURGERY  11/07/1996   Biopsy -Dx MS   CESAREAN SECTION     Patient Active Problem List   Diagnosis Date Noted   Achilles tendinitis of left lower extremity 04/04/2023   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   Prediabetes 03/02/2023   Vitamin D deficiency 03/02/2023   Dietary counseling and surveillance 06/08/2022   Hypopigmentation 06/08/2022   Severe obesity (BMI >= 40) (HCC) 05/25/2022   Food allergy 05/04/2022   Lower extremity edema 03/22/2022   Bilateral hand pain 12/27/2021   Trigger thumb of left hand 12/27/2021   Essential hypertension 04/27/2021   Osteoarthritis of left knee 02/25/2021   Knee pain, bilateral 01/15/2021   BMI 40.0-44.9, adult (HCC) 01/15/2021   Sickle cell trait (HCC) 04/08/2016   DUB (dysfunctional uterine bleeding) 04/01/2014   Multiple sclerosis (HCC)     PCP: de [place], Raymond J, MD   REFERRING PROVIDER: de [place], Raymond J, MD  REFERRING DIAG: [phone] (ICD-10-CM) - Achilles tendinitis of left lower extremity  THERAPY DIAG:  Pain in left ankle and joints of left foot  Difficulty in walking, not elsewhere classified  Muscle weakness (generalized)  Rationale for Evaluation and Treatment:  Rehabilitation  ONSET DATE: 04/04/2023  SUBJECTIVE:   SUBJECTIVE STATEMENT: Pain is just in the back.None in the front since last visit.  Eval:Patient reports long history of MS and OA.  I was diagnosed with MS in my 20's.  I am disabled.  I like to work out at home but MD said not to workout due to my ankle.  Pain in left achilles for several months.  She is currently in a walking boot and in night splint at night.  She has pain at the distal achilles at the attachment site.  She hopes to be able to resume exercise and walk without pain.   PERTINENT HISTORY: MS and OA  PAIN:  Are you having pain?  0/10  PRECAUTIONS: Other: MS  WEIGHT BEARING RESTRICTIONS: No  FALLS:  Has patient fallen in last 6 months? Yes. Number of falls March, collapsed from being overheated.  She states overheating with MS patients cause [place] to collapse.   LIVING ENVIRONMENT: Lives with: lives with their spouse Lives in: House/apartment Stairs: Yes: Internal: 3 steps; on right going up and External: 3 steps; on right going up Has following equipment at home: Quad cane small base  OCCUPATION: Disabled  PLOF: Independent, Independent with household mobility with device, Independent with community mobility with device, Independent with homemaking with ambulation, Independent with gait, and Independent with transfers  PATIENT GOALS: to be  able to walk without pain and workout  NEXT MD VISIT: Aug 5, (sees DPM on Aug 1)  OBJECTIVE:   DIAGNOSTIC FINDINGS: Spur left achilles  PATIENT SURVEYS:  LEFS 44/80  COGNITION: Overall cognitive status: Within functional limits for tasks assessed     SENSATION: WFL   POSTURE:  pes planus and valgus knee  PALPATION: Tender at distal achilles left 05/02/23 tender along lateral malleolus ligaments and peroneal tendons; no pain with MMT  LOWER EXTREMITY ROM:  Active ROM Right eval Left eval  Hip flexion    Hip extension    Hip abduction    Hip adduction     Hip internal rotation    Hip external rotation    Knee flexion    Knee extension    Ankle dorsiflexion  10  Ankle plantarflexion  48  Ankle inversion  28  Ankle eversion  12   (Blank rows = not tested)  LOWER EXTREMITY MMT:  MMT Right eval Left 05/02/23  Hip flexion    Hip extension    Hip abduction    Hip adduction    Hip internal rotation    Hip external rotation    Knee flexion    Knee extension    Ankle dorsiflexion  4+  Ankle plantarflexion    Ankle inversion  4+  Ankle eversion  4   (Blank rows = not tested)   FUNCTIONAL TESTS:  5 times sit to stand: 19.41 sec Timed up and go (TUG): 14.32  GAIT: Distance walked: 30 Assistive device utilized: Quad cane small base Level of assistance: Modified independence Comments: antalgic   TODAY'S TREATMENT:                                                                                                                              DATE:  05/23/23 Nustep L5 x 6 min goals reviewed Weight shifting in walking stance x 10 pre gait Gait down long hall and back x 2 no boot Standing heel raise TEMPO (3 sec up, 3sec hold, 3 sec down)  3 x 10 with 90 sec rest in between Standing toe raise  L x 15 Attempted SL heel raise L but unable to do Seated L heel raise x 20 Trans-friction massage  to L achilles and lateral ankle ligaments Ionotophoresis with 1.0 mL 4mg /ml Dexamethasone - 4-6 hour patch (42mA-min) to L heel (patch #4 of 6)  05/16/23 Nustep L5 x 5 min Gait down long hall and back Standing arch contraction x 5 5 sec hold Standing toe raise  L x 15 Standing heel raise x 10, then with ball squeeze at ankle x 15 Alternating cone tap ups x 20 Seated 4 way ankle tband x 20 each (yellow)  STM to L ext dig brevis Ionotophoresis with 1.0 mL 4mg /ml Dexamethasone - 4-6 hour patch (46mA-min) to L post/lateral ankle (patch #3 of 6)  05/08/23 Nustep L5 x 5 min Seated toe and heel raises  x 20 Circle BAPS board: DF/PF x 20, cw x 20,  ccw x 20 Rocker board x 2 min Rocker board propped for gastroc stretch x 10 holding 10 sec Rocker board propped for soleus stretch x 10 holding 10 sec left only Step up and hold x 10 fwd and then lateral (left) Alternating cone tap ups x 20 Seated towel push for inversion/eversion x 10 Seated toe scrunch x 10 Seated 4 way ankle tband x 20 each (yellow)  Ionotophoresis with 1.0 mL 4mg /ml Dexamethasone - 4-6 hour patch (65mA-min) to L lateral ankle (patch #2 of 6)  DATE:  05/04/23 Nustep L5 x 5 min Gastroc/soleus stretch at bars 3x30 sec ea Seated inv/ever, heel raise and toe raise x 10 ea, circles CW/CCW x 10 ea Rocker board x 10 LS x 30 sec Manual:  talofib and proximal tib/fib mobs  Ionotophoresis with 1.0 mL 4mg /ml Dexamethasone - 4-6 hour patch (54mA-min) to L lateral ankle (patch #1 of 6)    PATIENT EDUCATION:  Education details: updated HEP Person educated: Patient Education method: Programmer, multimedia, Demonstration, Verbal cues, and Handouts Education comprehension: verbalized understanding, returned demonstration, and verbal cues required  HOME EXERCISE PROGRAM: Access Code: [email or web address] URL: [email or web address] Date: 05/23/2023 Prepared by: Raynelle Fanning  Exercises - Seated Ankle Alphabet  - 1 x daily - 7 x weekly - 1 sets - 1 reps - a-z hold - Seated Heel Raise  - 1 x daily - 7 x weekly - 1 sets - 20 reps - Seated Toe Raise  - 1 x daily - 7 x weekly - 3 sets - 10 reps - Gastroc Stretch on Wall  - 1 x daily - 7 x weekly - 3 sets - 10 reps - Soleus Stretch on Wall  - 1 x daily - 7 x weekly - 3 sets - 10 reps - Seated Ankle Circles  - 2 x daily - 7 x weekly - 2 sets - 10 reps - Seated Ankle Eversion with Resistance  - 1 x daily - 3-4 x weekly - 1 sets - 10 reps - Seated Ankle Dorsiflexion with Resistance  - 1 x daily - 4 x weekly - 2 sets - 10 reps - Seated Ankle Inversion with Anchored Resistance  - 1 x daily - 3-4 x weekly - 1 sets - 10 reps - Standing Heel Raises  -  1 x daily - 4 x weekly - 2 sets - 10 reps - 3 sec hold  Patient Education - Ionto Pt Instructions - [facility]  ASSESSMENT:  CLINICAL IMPRESSION: Adithi is compliant with HEP. She continues to wear her boot and splint at home at all times except for exercise per MD instructions. She complains of pain at night, so PT advised going one night without splint to see response. She tolerated B heel raises with tempo without increased pain and reported her ankle felt good afterward. Added to HEP replacing regular heel raises. She is not able to do a SL heel raise yet. She reports ionto helps, so applied patch #4 today. Kamilya continues to demonstrate potential for improvement and would benefit from continued skilled therapy to address impairments.    OBJECTIVE IMPAIRMENTS: Abnormal gait, decreased balance, decreased endurance, decreased knowledge of use of DME, difficulty walking, decreased ROM, decreased strength, hypomobility, increased fascial restrictions, increased muscle spasms, impaired flexibility, obesity, and pain.   ACTIVITY LIMITATIONS: carrying, lifting, bending, standing, squatting, sleeping, stairs, transfers, and dressing  PARTICIPATION LIMITATIONS: meal prep, cleaning, laundry, shopping, community activity, yard work, and  church  PERSONAL FACTORS: Fitness, Past/current experiences, and 3+ comorbidities: MS, OA, and obesity  are also affecting patient's functional outcome.   REHAB POTENTIAL: Fair due to MS and obesity, treatment may be limited  CLINICAL DECISION MAKING: Evolving/moderate complexity  EVALUATION COMPLEXITY: Moderate   GOALS: Goals reviewed with patient? Yes  SHORT TERM GOALS: Target date: 05/24/2023  Pain report to be no greater than 4/10  Baseline: Goal status: IN PROGRESS average pain 5/10  05/23/23  2.  Patient will be independent with initial HEP  Baseline:  Goal status: MET  3.  Patient to be able to walk with boot without cane safely Baseline:  Goal  status: MET   LONG TERM GOALS: Target date: 06/21/2023   Patient to report pain no greater than 2/10  Baseline:  Goal status: INITIAL  2.  Patient to be independent with advanced HEP  Baseline:  Goal status: INITIAL  3.  Functional scores to improve by 2-3 seconds Baseline:  Goal status: INITIAL  4.  LEFS score to improve to 50-54 Baseline:  Goal status: INITIAL  5.  Patient to be able to ambulate with normal heel to toe progression without boot with minimal pain no greater than 1/10 Baseline:  Goal status: INITIAL  6.  Patient to be able to resume prior exercise routine Baseline:  Goal status: INITIAL   PLAN:  PT FREQUENCY: 2x/week  PT DURATION: 8 weeks  PLANNED INTERVENTIONS: Therapeutic exercises, Therapeutic activity, Neuromuscular re-education, Balance training, Gait training, Patient/Family education, Self Care, Joint mobilization, Stair training, Orthotic/Fit training, DME instructions, Aquatic Therapy, Dry Needling, Electrical stimulation, Cryotherapy, Splintting, Taping, Ultrasound, Ionotophoresis 4mg /ml Dexamethasone, Manual therapy, and Re-evaluation  PLAN FOR NEXT SESSION: continue with TEMPO heel raises, Balance in SLS, static and dynamic strength.  Ice if painful post session.   Solon Palm, PT 05/23/23 1:23 PM  [facility] [address] [place], [place] [address] Phone # [phone] Fax [phone]

## 2023-05-23 ENCOUNTER — Encounter: Payer: Self-pay | Admitting: Physical Therapy

## 2023-05-23 ENCOUNTER — Ambulatory Visit: Payer: 59 | Admitting: Physical Therapy

## 2023-05-23 DIAGNOSIS — M6281 Muscle weakness (generalized): Secondary | ICD-10-CM

## 2023-05-23 DIAGNOSIS — R262 Difficulty in walking, not elsewhere classified: Secondary | ICD-10-CM

## 2023-05-23 DIAGNOSIS — M25572 Pain in left ankle and joints of left foot: Secondary | ICD-10-CM

## 2023-05-25 ENCOUNTER — Ambulatory Visit (INDEPENDENT_AMBULATORY_CARE_PROVIDER_SITE_OTHER): Payer: 59 | Admitting: Family Medicine

## 2023-05-25 ENCOUNTER — Other Ambulatory Visit (INDEPENDENT_AMBULATORY_CARE_PROVIDER_SITE_OTHER): Payer: Self-pay | Admitting: Family Medicine

## 2023-05-25 ENCOUNTER — Encounter (INDEPENDENT_AMBULATORY_CARE_PROVIDER_SITE_OTHER): Payer: Self-pay | Admitting: Family Medicine

## 2023-05-25 ENCOUNTER — Ambulatory Visit: Payer: 59

## 2023-05-25 VITALS — BP 129/72 | HR 89 | Temp 97.8°F | Ht 67.0 in | Wt 309.0 lb

## 2023-05-25 DIAGNOSIS — R296 Repeated falls: Secondary | ICD-10-CM

## 2023-05-25 DIAGNOSIS — R739 Hyperglycemia, unspecified: Secondary | ICD-10-CM

## 2023-05-25 DIAGNOSIS — E7849 Other hyperlipidemia: Secondary | ICD-10-CM | POA: Diagnosis not present

## 2023-05-25 DIAGNOSIS — E669 Obesity, unspecified: Secondary | ICD-10-CM

## 2023-05-25 DIAGNOSIS — E559 Vitamin D deficiency, unspecified: Secondary | ICD-10-CM

## 2023-05-25 DIAGNOSIS — Z6841 Body Mass Index (BMI) 40.0 and over, adult: Secondary | ICD-10-CM

## 2023-05-25 DIAGNOSIS — M25572 Pain in left ankle and joints of left foot: Secondary | ICD-10-CM

## 2023-05-25 DIAGNOSIS — M6281 Muscle weakness (generalized): Secondary | ICD-10-CM

## 2023-05-25 DIAGNOSIS — G8929 Other chronic pain: Secondary | ICD-10-CM

## 2023-05-25 DIAGNOSIS — R252 Cramp and spasm: Secondary | ICD-10-CM

## 2023-05-25 DIAGNOSIS — R262 Difficulty in walking, not elsewhere classified: Secondary | ICD-10-CM

## 2023-05-25 MED ORDER — VITAMIN D (ERGOCALCIFEROL) 1.25 MG (50000 UNIT) PO CAPS
50000.0000 [IU] | ORAL_CAPSULE | ORAL | 0 refills | Status: DC
Start: 2023-05-25 — End: 2023-06-19

## 2023-05-25 MED ORDER — ATORVASTATIN CALCIUM 10 MG PO TABS
10.0000 mg | ORAL_TABLET | Freq: Every day | ORAL | 0 refills | Status: DC
Start: 2023-05-25 — End: 2023-06-19

## 2023-05-25 NOTE — Therapy (Signed)
OUTPATIENT PHYSICAL THERAPY LOWER EXTREMITYTREATMENT   Patient Name: Meredith Wright MRN: [id number] DOB:07-04-1971, 52 y.o., female Today's Date: 05/25/2023  END OF SESSION:  PT End of Session - 05/25/23 1234     Visit Number 7    Date for PT Re-Evaluation 06/21/23    Authorization Type [employer]    PT Start Time 1230    PT Stop Time 1315    PT Time Calculation (min) 45 min    Activity Tolerance Patient tolerated treatment well    Behavior During Therapy WFL for tasks assessed/performed               Past Medical History:  Diagnosis Date   Arthritis    Joint pain    Multiple food allergies    Multiple sclerosis (HCC)    Sickle cell anemia (HCC)    trait   Past Surgical History:  Procedure Laterality Date   BRAIN SURGERY  11/07/1996   Biopsy -Dx MS   CESAREAN SECTION     Patient Active Problem List   Diagnosis Date Noted   Achilles tendinitis of left lower extremity 04/04/2023   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   Prediabetes 03/02/2023   Vitamin D deficiency 03/02/2023   Dietary counseling and surveillance 06/08/2022   Hypopigmentation 06/08/2022   Severe obesity (BMI >= 40) (HCC) 05/25/2022   Food allergy 05/04/2022   Lower extremity edema 03/22/2022   Bilateral hand pain 12/27/2021   Trigger thumb of left hand 12/27/2021   Essential hypertension 04/27/2021   Osteoarthritis of left knee 02/25/2021   Knee pain, bilateral 01/15/2021   BMI 40.0-44.9, adult (HCC) 01/15/2021   Sickle cell trait (HCC) 04/08/2016   DUB (dysfunctional uterine bleeding) 04/01/2014   Multiple sclerosis (HCC)     PCP: de [place], Raymond J, MD   REFERRING PROVIDER: de [place], Raymond J, MD  REFERRING DIAG: [phone] (ICD-10-CM) - Achilles tendinitis of left lower extremity  THERAPY DIAG:  Pain in left ankle and joints of left foot  Difficulty in walking, not elsewhere classified  Muscle weakness (generalized)  Repeated falls  Chronic pain of left  knee  Chronic pain of right knee  Cramp and spasm  Rationale for Evaluation and Treatment: Rehabilitation  ONSET DATE: 04/04/2023  SUBJECTIVE:   SUBJECTIVE STATEMENT: Patient reports she is "doing pretty good" Pain reported at 3/10.   Eval:Patient reports long history of MS and OA.  I was diagnosed with MS in my 20's.  I am disabled.  I like to work out at home but MD said not to workout due to my ankle.  Pain in left achilles for several months.  She is currently in a walking boot and in night splint at night.  She has pain at the distal achilles at the attachment site.  She hopes to be able to resume exercise and walk without pain.   PERTINENT HISTORY: MS and OA  PAIN:  Are you having pain?  0/10  PRECAUTIONS: Other: MS  WEIGHT BEARING RESTRICTIONS: No  FALLS:  Has patient fallen in last 6 months? Yes. Number of falls March, collapsed from being overheated.  She states overheating with MS patients cause [place] to collapse.   LIVING ENVIRONMENT: Lives with: lives with their spouse Lives in: House/apartment Stairs: Yes: Internal: 3 steps; on right going up and External: 3 steps; on right going up Has following equipment at home: Quad cane small base  OCCUPATION: Disabled  PLOF: Independent, Independent with household mobility with device, Independent with community mobility  with device, Independent with homemaking with ambulation, Independent with gait, and Independent with transfers  PATIENT GOALS: to be able to walk without pain and workout  NEXT MD VISIT: Aug 5, (sees DPM on Aug 1)  OBJECTIVE:   DIAGNOSTIC FINDINGS: Spur left achilles  PATIENT SURVEYS:  LEFS 44/80  COGNITION: Overall cognitive status: Within functional limits for tasks assessed     SENSATION: WFL   POSTURE:  pes planus and valgus knee  PALPATION: Tender at distal achilles left 05/02/23 tender along lateral malleolus ligaments and peroneal tendons; no pain with MMT  LOWER EXTREMITY  ROM:  Active ROM Right eval Left eval  Hip flexion    Hip extension    Hip abduction    Hip adduction    Hip internal rotation    Hip external rotation    Knee flexion    Knee extension    Ankle dorsiflexion  10  Ankle plantarflexion  48  Ankle inversion  28  Ankle eversion  12   (Blank rows = not tested)  LOWER EXTREMITY MMT:  MMT Right eval Left 05/02/23  Hip flexion    Hip extension    Hip abduction    Hip adduction    Hip internal rotation    Hip external rotation    Knee flexion    Knee extension    Ankle dorsiflexion  4+  Ankle plantarflexion    Ankle inversion  4+  Ankle eversion  4   (Blank rows = not tested)   FUNCTIONAL TESTS:  5 times sit to stand: 19.41 sec Timed up and go (TUG): 14.32  GAIT: Distance walked: 30 Assistive device utilized: Quad cane small base Level of assistance: Modified independence Comments: antalgic   TODAY'S TREATMENT:                                                                                                                              DATE:  05/25/23 Nustep L5 x 6 min goals reviewed TEMPO heel raises (3 step up, 30 sec hold, 3 step down, x 10 then 90 sec rest) Seated (for rest break and applied ice to neck to keep patient cooled down) toe and heel raises Step up and hold 2 x 10 fwd and 2 x 10 lateral in // bars 4 way ankle tband with yellow tband Gait training/ distance walking in normal shoe x 100 feet.  05/23/23 Nustep L5 x 6 min goals reviewed Weight shifting in walking stance x 10 pre gait Gait down long hall and back x 2 no boot Standing heel raise TEMPO (3 sec up, 3sec hold, 3 sec down)  3 x 10 with 90 sec rest in between Standing toe raise  L x 15 Attempted SL heel raise L but unable to do Seated L heel raise x 20 Trans-friction massage  to L achilles and lateral ankle ligaments Ionotophoresis with 1.0 mL 4mg /ml Dexamethasone - 4-6 hour patch (7mA-min) to L heel (patch #  4 of 6)  05/16/23 Nustep L5 x 5  min Gait down long hall and back Standing arch contraction x 5 5 sec hold Standing toe raise  L x 15 Standing heel raise x 10, then with ball squeeze at ankle x 15 Alternating cone tap ups x 20 Seated 4 way ankle tband x 20 each (yellow)  STM to L ext dig brevis Ionotophoresis with 1.0 mL 4mg /ml Dexamethasone - 4-6 hour patch (6mA-min) to L post/lateral ankle (patch #3 of 6)  05/08/23 Nustep L5 x 5 min Seated toe and heel raises x 20 Circle BAPS board: DF/PF x 20, cw x 20, ccw x 20 Rocker board x 2 min Rocker board propped for gastroc stretch x 10 holding 10 sec Rocker board propped for soleus stretch x 10 holding 10 sec left only Step up and hold x 10 fwd and then lateral (left) Alternating cone tap ups x 20 Seated towel push for inversion/eversion x 10 Seated toe scrunch x 10 Seated 4 way ankle tband x 20 each (yellow)  Ionotophoresis with 1.0 mL 4mg /ml Dexamethasone - 4-6 hour patch (43mA-min) to L lateral ankle (patch #2 of 6)  DATE:  05/04/23 Nustep L5 x 5 min Gastroc/soleus stretch at bars 3x30 sec ea Seated inv/ever, heel raise and toe raise x 10 ea, circles CW/CCW x 10 ea Rocker board x 10 LS x 30 sec Manual:  talofib and proximal tib/fib mobs  Ionotophoresis with 1.0 mL 4mg /ml Dexamethasone - 4-6 hour patch (66mA-min) to L lateral ankle (patch #1 of 6)    PATIENT EDUCATION:  Education details: updated HEP Person educated: Patient Education method: Programmer, multimedia, Demonstration, Verbal cues, and Handouts Education comprehension: verbalized understanding, returned demonstration, and verbal cues required  HOME EXERCISE PROGRAM: Access Code: [email or web address] URL: [email or web address] Date: 05/23/2023 Prepared by: Raynelle Fanning  Exercises - Seated Ankle Alphabet  - 1 x daily - 7 x weekly - 1 sets - 1 reps - a-z hold - Seated Heel Raise  - 1 x daily - 7 x weekly - 1 sets - 20 reps - Seated Toe Raise  - 1 x daily - 7 x weekly - 3 sets - 10 reps - Gastroc Stretch on  Wall  - 1 x daily - 7 x weekly - 3 sets - 10 reps - Soleus Stretch on Wall  - 1 x daily - 7 x weekly - 3 sets - 10 reps - Seated Ankle Circles  - 2 x daily - 7 x weekly - 2 sets - 10 reps - Seated Ankle Eversion with Resistance  - 1 x daily - 3-4 x weekly - 1 sets - 10 reps - Seated Ankle Dorsiflexion with Resistance  - 1 x daily - 4 x weekly - 2 sets - 10 reps - Seated Ankle Inversion with Anchored Resistance  - 1 x daily - 3-4 x weekly - 1 sets - 10 reps - Standing Heel Raises  - 1 x daily - 4 x weekly - 2 sets - 10 reps - 3 sec hold  Patient Education - Ionto Pt Instructions - [facility]  ASSESSMENT:  CLINICAL IMPRESSION: Verneal is progressing appropriately.  She ambulates with min difficulty with good heel to toe progression without good heel to toe progression. She is well motivated, has good support system and is compliant.  Tiffani continues to demonstrate potential for improvement and would benefit from continued skilled therapy to address impairments.   OBJECTIVE IMPAIRMENTS: Abnormal gait, decreased balance, decreased endurance, decreased knowledge of use of DME,  difficulty walking, decreased ROM, decreased strength, hypomobility, increased fascial restrictions, increased muscle spasms, impaired flexibility, obesity, and pain.   ACTIVITY LIMITATIONS: carrying, lifting, bending, standing, squatting, sleeping, stairs, transfers, and dressing  PARTICIPATION LIMITATIONS: meal prep, cleaning, laundry, shopping, community activity, yard work, and church  PERSONAL FACTORS: Fitness, Past/current experiences, and 3+ comorbidities: MS, OA, and obesity  are also affecting patient's functional outcome.   REHAB POTENTIAL: Fair due to MS and obesity, treatment may be limited  CLINICAL DECISION MAKING: Evolving/moderate complexity  EVALUATION COMPLEXITY: Moderate   GOALS: Goals reviewed with patient? Yes  SHORT TERM GOALS: Target date: 05/24/2023  Pain report to be no greater than 4/10   Baseline: Goal status: MET 05/25/23  2.  Patient will be independent with initial HEP  Baseline:  Goal status: MET  3.  Patient to be able to walk with boot without cane safely Baseline:  Goal status: MET   LONG TERM GOALS: Target date: 06/21/2023   Patient to report pain no greater than 2/10  Baseline:  Goal status: INITIAL  2.  Patient to be independent with advanced HEP  Baseline:  Goal status: INITIAL  3.  Functional scores to improve by 2-3 seconds Baseline:  Goal status: INITIAL  4.  LEFS score to improve to 50-54 Baseline:  Goal status: INITIAL  5.  Patient to be able to ambulate with normal heel to toe progression without boot with minimal pain no greater than 1/10 Baseline:  Goal status: INITIAL  6.  Patient to be able to resume prior exercise routine Baseline:  Goal status: INITIAL   PLAN:  PT FREQUENCY: 2x/week  PT DURATION: 8 weeks  PLANNED INTERVENTIONS: Therapeutic exercises, Therapeutic activity, Neuromuscular re-education, Balance training, Gait training, Patient/Family education, Self Care, Joint mobilization, Stair training, Orthotic/Fit training, DME instructions, Aquatic Therapy, Dry Needling, Electrical stimulation, Cryotherapy, Splintting, Taping, Ultrasound, Ionotophoresis 4mg /ml Dexamethasone, Manual therapy, and Re-evaluation  PLAN FOR NEXT SESSION: continue with TEMPO heel raises, Balance in SLS, static and dynamic strength.  Ice if painful post session.   Victorino Dike B. Vida Nicol, PT 05/25/23 3:26 PM  [facility] [address] [place], [place] [address] Phone # [phone] Fax [phone]

## 2023-05-29 NOTE — Therapy (Signed)
OUTPATIENT PHYSICAL THERAPY LOWER EXTREMITYTREATMENT   Patient Name: Meredith Wright MRN: [id number] DOB:12/17/1970, 52 y.o., female Today's Date: 05/30/2023  END OF SESSION:  PT End of Session - 05/30/23 1235     Visit Number 8    Date for PT Re-Evaluation 06/21/23    Authorization Type [employer]    PT Start Time 1235    PT Stop Time 1317    PT Time Calculation (min) 42 min    Activity Tolerance Patient tolerated treatment well    Behavior During Therapy WFL for tasks assessed/performed                Past Medical History:  Diagnosis Date   Arthritis    Joint pain    Multiple food allergies    Multiple sclerosis (HCC)    Sickle cell anemia (HCC)    trait   Past Surgical History:  Procedure Laterality Date   BRAIN SURGERY  11/07/1996   Biopsy -Dx MS   CESAREAN SECTION     Patient Active Problem List   Diagnosis Date Noted   Achilles tendinitis of left lower extremity 04/04/2023   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   Prediabetes 03/02/2023   Vitamin D deficiency 03/02/2023   Dietary counseling and surveillance 06/08/2022   Hypopigmentation 06/08/2022   Severe obesity (BMI >= 40) (HCC) 05/25/2022   Food allergy 05/04/2022   Lower extremity edema 03/22/2022   Bilateral hand pain 12/27/2021   Trigger thumb of left hand 12/27/2021   Essential hypertension 04/27/2021   Osteoarthritis of left knee 02/25/2021   Knee pain, bilateral 01/15/2021   BMI 40.0-44.9, adult (HCC) 01/15/2021   Sickle cell trait (HCC) 04/08/2016   DUB (dysfunctional uterine bleeding) 04/01/2014   Multiple sclerosis (HCC)     PCP: de [place], Raymond J, MD   REFERRING PROVIDER: de [place], Raymond J, MD  REFERRING DIAG: [phone] (ICD-10-CM) - Achilles tendinitis of left lower extremity  THERAPY DIAG:  Pain in left ankle and joints of left foot  Difficulty in walking, not elsewhere classified  Muscle weakness (generalized)  Rationale for Evaluation and Treatment:  Rehabilitation  ONSET DATE: 04/04/2023  SUBJECTIVE:   SUBJECTIVE STATEMENT: No pain today. New tingling noted in lateral L foot. Sore in achilles.    Eval:Patient reports long history of MS and OA.  I was diagnosed with MS in my 20's.  I am disabled.  I like to work out at home but MD said not to workout due to my ankle.  Pain in left achilles for several months.  She is currently in a walking boot and in night splint at night.  She has pain at the distal achilles at the attachment site.  She hopes to be able to resume exercise and walk without pain.   PERTINENT HISTORY: MS and OA  PAIN:  Are you having pain?  0/10  PRECAUTIONS: Other: MS  WEIGHT BEARING RESTRICTIONS: No  FALLS:  Has patient fallen in last 6 months? Yes. Number of falls March, collapsed from being overheated.  She states overheating with MS patients cause [place] to collapse.   LIVING ENVIRONMENT: Lives with: lives with their spouse Lives in: House/apartment Stairs: Yes: Internal: 3 steps; on right going up and External: 3 steps; on right going up Has following equipment at home: Quad cane small base  OCCUPATION: Disabled  PLOF: Independent, Independent with household mobility with device, Independent with community mobility with device, Independent with homemaking with ambulation, Independent with gait, and Independent with transfers  PATIENT GOALS: to be able to walk without pain and workout  NEXT MD VISIT: Aug 5, (sees DPM on Aug 1)  OBJECTIVE:   DIAGNOSTIC FINDINGS: Spur left achilles  PATIENT SURVEYS:  LEFS 44/80  COGNITION: Overall cognitive status: Within functional limits for tasks assessed     SENSATION: WFL   POSTURE:  pes planus and valgus knee  PALPATION: Tender at distal achilles left 05/02/23 tender along lateral malleolus ligaments and peroneal tendons; no pain with MMT  LOWER EXTREMITY ROM:  Active ROM Right eval Left eval  Hip flexion    Hip extension    Hip abduction    Hip  adduction    Hip internal rotation    Hip external rotation    Knee flexion    Knee extension    Ankle dorsiflexion  10  Ankle plantarflexion  48  Ankle inversion  28  Ankle eversion  12   (Blank rows = not tested)  LOWER EXTREMITY MMT:  MMT Right eval Left 05/02/23  Hip flexion    Hip extension    Hip abduction    Hip adduction    Hip internal rotation    Hip external rotation    Knee flexion    Knee extension    Ankle dorsiflexion  4+  Ankle plantarflexion    Ankle inversion  4+  Ankle eversion  4   (Blank rows = not tested)   FUNCTIONAL TESTS:  5 times sit to stand: 19.41 sec Timed up and go (TUG): 14.32  GAIT: Distance walked: 30 Assistive device utilized: Quad cane small base Level of assistance: Modified independence Comments: antalgic   TODAY'S TREATMENT:                                                                                                                              DATE:  05/30/23 TEMPO heel raises (3 step up, 3 sec hold, 3 step down, x 10 then 90 sec rest) Seated toe raises during rest 1, seated short foot arch sets during rest 2, and seated eversion during rest 3 Step up and 5 sec hold (SLS) 2 x 10 fwd and 2 x 10 lateral in // bars (first set on 6 inch, 2nd set on 4 inch) Gait training/ distance walking in normal shoe x 100 feet.  05/25/23 Nustep L5 x 6 min goals reviewed TEMPO heel raises (3 step up, 30 sec hold, 3 step down, x 10 then 90 sec rest) Seated (for rest break and applied ice to neck to keep patient cooled down) toe and heel raises Step up and hold 2 x 10 fwd and 2 x 10 lateral in // bars 4 way ankle tband with yellow tband Gait training/ distance walking in normal shoe x 100 feet.  05/23/23 Nustep L5 x 6 min goals reviewed Weight shifting in walking stance x 10 pre gait Gait down long hall and back x 2 no boot Standing heel raise TEMPO (3  sec up, 3sec hold, 3 sec down)  3 x 10 with 90 sec rest in between Standing toe raise   L x 15 Attempted SL heel raise L but unable to do Seated L heel raise x 20 Trans-friction massage  to L achilles and lateral ankle ligaments Ionotophoresis with 1.0 mL 4mg /ml Dexamethasone - 4-6 hour patch (51mA-min) to L heel (patch #4 of 6)   PATIENT EDUCATION:  Education details: updated HEP Person educated: Patient Education method: Programmer, multimedia, Demonstration, Verbal cues, and Handouts Education comprehension: verbalized understanding, returned demonstration, and verbal cues required  HOME EXERCISE PROGRAM: Access Code: [email or web address] URL: [email or web address] Date: 05/23/2023 Prepared by: Raynelle Fanning  Exercises - Seated Ankle Alphabet  - 1 x daily - 7 x weekly - 1 sets - 1 reps - a-z hold - Seated Heel Raise  - 1 x daily - 7 x weekly - 1 sets - 20 reps - Seated Toe Raise  - 1 x daily - 7 x weekly - 3 sets - 10 reps - Gastroc Stretch on Wall  - 1 x daily - 7 x weekly - 3 sets - 10 reps - Soleus Stretch on Wall  - 1 x daily - 7 x weekly - 3 sets - 10 reps - Seated Ankle Circles  - 2 x daily - 7 x weekly - 2 sets - 10 reps - Seated Ankle Eversion with Resistance  - 1 x daily - 3-4 x weekly - 1 sets - 10 reps - Seated Ankle Dorsiflexion with Resistance  - 1 x daily - 4 x weekly - 2 sets - 10 reps - Seated Ankle Inversion with Anchored Resistance  - 1 x daily - 3-4 x weekly - 1 sets - 10 reps - Standing Heel Raises  - 1 x daily - 4 x weekly - 2 sets - 10 reps - 3 sec hold  Patient Education - [place] Pt Instructions - [facility]  ASSESSMENT:  CLINICAL IMPRESSION: Jamiah demonstrates improved stability with SLS activities today. She still has intermittent eversion in the left foot. She is also able to only use intermittent UE support with heel raises. She continues to report soreness in achilles tendon, but no increased pain with TE.Cecillia continues to demonstrate potential for improvement and would benefit from continued skilled therapy to address impairments.    OBJECTIVE  IMPAIRMENTS: Abnormal gait, decreased balance, decreased endurance, decreased knowledge of use of DME, difficulty walking, decreased ROM, decreased strength, hypomobility, increased fascial restrictions, increased muscle spasms, impaired flexibility, obesity, and pain.   ACTIVITY LIMITATIONS: carrying, lifting, bending, standing, squatting, sleeping, stairs, transfers, and dressing  PARTICIPATION LIMITATIONS: meal prep, cleaning, laundry, shopping, community activity, yard work, and church  PERSONAL FACTORS: Fitness, Past/current experiences, and 3+ comorbidities: MS, OA, and obesity  are also affecting patient's functional outcome.   REHAB POTENTIAL: Fair due to MS and obesity, treatment may be limited  CLINICAL DECISION MAKING: Evolving/moderate complexity  EVALUATION COMPLEXITY: Moderate   GOALS: Goals reviewed with patient? Yes  SHORT TERM GOALS: Target date: 05/24/2023  Pain report to be no greater than 4/10  Baseline: Goal status: MET 05/25/23  2.  Patient will be independent with initial HEP  Baseline:  Goal status: MET  3.  Patient to be able to walk with boot without cane safely Baseline:  Goal status: MET   LONG TERM GOALS: Target date: 06/21/2023   Patient to report pain no greater than 2/10  Baseline:  Goal status: INITIAL  2.  Patient to be independent with advanced  HEP  Baseline:  Goal status: INITIAL  3.  Functional scores to improve by 2-3 seconds Baseline:  Goal status: INITIAL  4.  LEFS score to improve to 50-54 Baseline:  Goal status: INITIAL  5.  Patient to be able to ambulate with normal heel to toe progression without boot with minimal pain no greater than 1/10 Baseline:  Goal status: INITIAL  6.  Patient to be able to resume prior exercise routine Baseline:  Goal status: INITIAL   PLAN:  PT FREQUENCY: 2x/week  PT DURATION: 8 weeks  PLANNED INTERVENTIONS: Therapeutic exercises, Therapeutic activity, Neuromuscular re-education,  Balance training, Gait training, Patient/Family education, Self Care, Joint mobilization, Stair training, Orthotic/Fit training, DME instructions, Aquatic Therapy, Dry Needling, Electrical stimulation, Cryotherapy, Splintting, Taping, Ultrasound, Ionotophoresis 4mg /ml Dexamethasone, Manual therapy, and Re-evaluation  PLAN FOR NEXT SESSION: continue with TEMPO heel raises, Balance in SLS, static and dynamic strength.  Ice if painful post session.   Victorino Dike B. Fields, PT 05/30/23 1:25 PM  [facility] [address] [place], [place] [address] Phone # [phone] Fax [phone]

## 2023-05-29 NOTE — Progress Notes (Unsigned)
Chief Complaint:   OBESITY Meredith Wright is here to discuss her progress with her obesity treatment plan along with follow-up of her obesity related diagnoses. Meredith Wright is on keeping a food journal and adhering to recommended goals of 1650-1800 calories and 125+ grams of protein and states she is following her eating plan approximately 0% of the time. Meredith Wright states she is doing 0 minutes 0 times per week.  Today's visit was #: 10 Starting weight: 308 lbs Starting date: 11/30/2022 Today's weight: 309 lbs Today's date: 05/25/2023 Total lbs lost to date: 0 Total lbs lost since last in-office visit: 0  Interim History: Total calories per week 1389, 4098,1191, 2007 over the last few weeks; protein average per week 90, 116, 91, 103. She is struggling to find Fair life milk. A few days she is significantly low on protein but high in calories. She has not gotten her lactose free protein shakes, so she is not taking in that additional protein.   Subjective:   1. Vitamin D deficiency Patient is on prescription Vitamin D. She denies nausea, vomiting, or muscle weakness but notes fatigue.   2. Other hyperlipidemia Patient has not been able to take Lipitor due to running out. No side effects when she is taking it.   Assessment/Plan:   1. Vitamin D deficiency Patient will continue prescription Vitamin D once weekly, and we will refill for 1 month.   - Vitamin D, Ergocalciferol, (DRISDOL) 1.25 MG (50000 UNIT) CAPS capsule; Take 1 capsule (50,000 Units total) by mouth every 7 (seven) days.  Dispense: 4 capsule; Refill: 0  2. Other hyperlipidemia We will refill Lipitor 10 mg daily for 1 month.   - atorvastatin (LIPITOR) 10 MG tablet; Take 1 tablet (10 mg total) by mouth daily.  Dispense: 30 tablet; Refill: 0  3. BMI 45.0-49.9, adult (HCC)  4. Obesity with starting BMI of 49.2 Meredith Wright is currently in the action stage of change. As such, her goal is to continue with weight loss efforts. She has agreed to  keeping a food journal and adhering to recommended goals of 1650-1800 calories and 125+ grams of protein daily.   Exercise goals: Patient was given handout for activity to start adding in resistance bands activity 3 times per week.   Behavioral modification strategies: increasing lean protein intake, meal planning and cooking strategies, keeping healthy foods in the home, planning for success, and keeping a strict food journal.  Meredith Wright has agreed to follow-up with our clinic in 3 weeks. She was informed of the importance of frequent follow-up visits to maximize her success with intensive lifestyle modifications for her multiple health conditions.   Objective:   Blood pressure 129/72, pulse 89, temperature 97.8 F (36.6 C), height 5\' 7"  (1.702 m), weight (!) 309 lb (140.2 kg), SpO2 97%. Body mass index is 48.4 kg/m.  General: Cooperative, alert, well developed, in no acute distress. HEENT: Conjunctivae and lids unremarkable. Cardiovascular: Regular rhythm.  Lungs: Normal work of breathing. Neurologic: No focal deficits.   Lab Results  Component Value Date   CREATININE 0.81 11/30/2022   BUN 17 11/30/2022   NA 142 11/30/2022   K 4.0 11/30/2022   CL 103 11/30/2022   CO2 21 11/30/2022   Lab Results  Component Value Date   ALT 22 11/30/2022   AST 16 11/30/2022   ALKPHOS 142 (H) 11/30/2022   BILITOT 0.4 11/30/2022   Lab Results  Component Value Date   HGBA1C 6.1 (H) 11/30/2022   Lab Results  Component Value Date   INSULIN 13.7 11/30/2022   Lab Results  Component Value Date   TSH 1.520 11/30/2022   Lab Results  Component Value Date   CHOL 243 (H) 11/30/2022   HDL 42 11/30/2022   LDLCALC 157 (H) 11/30/2022   TRIG 236 (H) 11/30/2022   CHOLHDL 5.4 (H) 06/13/2022   Lab Results  Component Value Date   VD25OH 11.7 (L) 11/30/2022   Lab Results  Component Value Date   WBC 12.4 (H) 11/30/2022   HGB 13.3 11/30/2022   HCT 40.3 11/30/2022   MCV 83 11/30/2022   PLT 364  11/30/2022   Lab Results  Component Value Date   IRON 43 06/06/2013   TIBC 328 06/06/2013   Attestation Statements:   Reviewed by clinician on day of visit: allergies, medications, problem list, medical history, surgical history, family history, social history, and previous encounter notes.   I, Burt Knack, am acting as transcriptionist for Reuben Likes, MD.  I have reviewed the above documentation for accuracy and completeness, and I agree with the above. - Reuben Likes, MD

## 2023-05-30 ENCOUNTER — Ambulatory Visit: Payer: 59 | Admitting: Physical Therapy

## 2023-05-30 ENCOUNTER — Encounter: Payer: Self-pay | Admitting: Physical Therapy

## 2023-05-30 DIAGNOSIS — M6281 Muscle weakness (generalized): Secondary | ICD-10-CM

## 2023-05-30 DIAGNOSIS — M25572 Pain in left ankle and joints of left foot: Secondary | ICD-10-CM | POA: Diagnosis not present

## 2023-05-30 DIAGNOSIS — R262 Difficulty in walking, not elsewhere classified: Secondary | ICD-10-CM

## 2023-06-01 ENCOUNTER — Ambulatory Visit: Payer: 59

## 2023-06-01 DIAGNOSIS — R296 Repeated falls: Secondary | ICD-10-CM

## 2023-06-01 DIAGNOSIS — R262 Difficulty in walking, not elsewhere classified: Secondary | ICD-10-CM

## 2023-06-01 DIAGNOSIS — G8929 Other chronic pain: Secondary | ICD-10-CM

## 2023-06-01 DIAGNOSIS — M25572 Pain in left ankle and joints of left foot: Secondary | ICD-10-CM | POA: Diagnosis not present

## 2023-06-01 DIAGNOSIS — M6281 Muscle weakness (generalized): Secondary | ICD-10-CM

## 2023-06-01 NOTE — Therapy (Signed)
OUTPATIENT PHYSICAL THERAPY LOWER EXTREMITYTREATMENT   Patient Name: Meredith Wright MRN: [id number] DOB:1971-10-06, 52 y.o., female Today's Date: 06/01/2023  END OF SESSION:  PT End of Session - 06/01/23 1243     Visit Number 9    Date for PT Re-Evaluation 06/21/23    Authorization Type [employer]    PT Start Time 1230    PT Stop Time 1320    PT Time Calculation (min) 50 min    Activity Tolerance Patient tolerated treatment well    Behavior During Therapy WFL for tasks assessed/performed                Past Medical History:  Diagnosis Date   Arthritis    Joint pain    Multiple food allergies    Multiple sclerosis (HCC)    Sickle cell anemia (HCC)    trait   Past Surgical History:  Procedure Laterality Date   BRAIN SURGERY  11/07/1996   Biopsy -Dx MS   CESAREAN SECTION     Patient Active Problem List   Diagnosis Date Noted   Achilles tendinitis of left lower extremity 04/04/2023   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   Prediabetes 03/02/2023   Vitamin D deficiency 03/02/2023   Dietary counseling and surveillance 06/08/2022   Hypopigmentation 06/08/2022   Severe obesity (BMI >= 40) (HCC) 05/25/2022   Food allergy 05/04/2022   Lower extremity edema 03/22/2022   Bilateral hand pain 12/27/2021   Trigger thumb of left hand 12/27/2021   Essential hypertension 04/27/2021   Osteoarthritis of left knee 02/25/2021   Knee pain, bilateral 01/15/2021   BMI 40.0-44.9, adult (HCC) 01/15/2021   Sickle cell trait (HCC) 04/08/2016   DUB (dysfunctional uterine bleeding) 04/01/2014   Multiple sclerosis (HCC)     PCP: de [place], Raymond J, MD   REFERRING PROVIDER: de [place], Raymond J, MD  REFERRING DIAG: [phone] (ICD-10-CM) - Achilles tendinitis of left lower extremity  THERAPY DIAG:  Pain in left ankle and joints of left foot  Difficulty in walking, not elsewhere classified  Muscle weakness (generalized)  Repeated falls  Chronic pain of left  knee  Chronic pain of right knee  Rationale for Evaluation and Treatment: Rehabilitation  ONSET DATE: 04/04/2023  SUBJECTIVE:   SUBJECTIVE STATEMENT: Sore in achilles.    Eval:Patient reports long history of MS and OA.  I was diagnosed with MS in my 20's.  I am disabled.  I like to work out at home but MD said not to workout due to my ankle.  Pain in left achilles for several months.  She is currently in a walking boot and in night splint at night.  She has pain at the distal achilles at the attachment site.  She hopes to be able to resume exercise and walk without pain.   PERTINENT HISTORY: MS and OA  PAIN:  Are you having pain?  0/10  PRECAUTIONS: Other: MS  WEIGHT BEARING RESTRICTIONS: No  FALLS:  Has patient fallen in last 6 months? Yes. Number of falls March, collapsed from being overheated.  She states overheating with MS patients cause [place] to collapse.   LIVING ENVIRONMENT: Lives with: lives with their spouse Lives in: House/apartment Stairs: Yes: Internal: 3 steps; on right going up and External: 3 steps; on right going up Has following equipment at home: Quad cane small base  OCCUPATION: Disabled  PLOF: Independent, Independent with household mobility with device, Independent with community mobility with device, Independent with homemaking with ambulation, Independent with gait,  and Independent with transfers  PATIENT GOALS: to be able to walk without pain and workout  NEXT MD VISIT: Aug 5, (sees DPM on Aug 1)  OBJECTIVE:   DIAGNOSTIC FINDINGS: Spur left achilles  PATIENT SURVEYS:  LEFS 44/80  COGNITION: Overall cognitive status: Within functional limits for tasks assessed     SENSATION: WFL   POSTURE:  pes planus and valgus knee  PALPATION: Tender at distal achilles left 05/02/23 tender along lateral malleolus ligaments and peroneal tendons; no pain with MMT  LOWER EXTREMITY ROM:  Active ROM Right eval Left eval  Hip flexion    Hip extension     Hip abduction    Hip adduction    Hip internal rotation    Hip external rotation    Knee flexion    Knee extension    Ankle dorsiflexion  10  Ankle plantarflexion  48  Ankle inversion  28  Ankle eversion  12   (Blank rows = not tested)  LOWER EXTREMITY MMT:  MMT Right eval Left 05/02/23  Hip flexion    Hip extension    Hip abduction    Hip adduction    Hip internal rotation    Hip external rotation    Knee flexion    Knee extension    Ankle dorsiflexion  4+  Ankle plantarflexion    Ankle inversion  4+  Ankle eversion  4   (Blank rows = not tested)   FUNCTIONAL TESTS:  5 times sit to stand: 19.41 sec Timed up and go (TUG): 14.32  GAIT: Distance walked: 30 Assistive device utilized: Quad cane small base Level of assistance: Modified independence Comments: antalgic   TODAY'S TREATMENT:                                                                                                                              DATE:  06/01/23 Nustep L5 x 6 min goals reviewed Traditional heel raises x 20 (held on temp due to achilles pain) Rocker board x 2 min Half roll inv/ev x 20 Seated toe and heel raises x 20 Step up and hold fwd then lateral x 10 Seated rest break with ice to avoid over heating 4 way ankle tband with yellow tband  05/30/23 TEMPO heel raises (3 step up, 3 sec hold, 3 step down, x 10 then 90 sec rest) Seated toe raises during rest 1, seated short foot arch sets during rest 2, and seated eversion during rest 3 Step up and 5 sec hold (SLS) 2 x 10 fwd and 2 x 10 lateral in // bars (first set on 6 inch, 2nd set on 4 inch) Gait training/ distance walking in normal shoe x 100 feet.  05/25/23 Nustep L5 x 6 min goals reviewed TEMPO heel raises (3 step up, 30 sec hold, 3 step down, x 10 then 90 sec rest) Seated (for rest break and applied ice to neck to keep patient cooled down) toe and  heel raises Step up and hold 2 x 10 fwd and 2 x 10 lateral in // bars 4 way  ankle tband with yellow tband Gait training/ distance walking in normal shoe x 100 feet.   PATIENT EDUCATION:  Education details: updated HEP Person educated: Patient Education method: Programmer, multimedia, Facilities manager, Verbal cues, and Handouts Education comprehension: verbalized understanding, returned demonstration, and verbal cues required  HOME EXERCISE PROGRAM: Access Code: [email or web address] URL: [email or web address] Date: 05/23/2023 Prepared by: Raynelle Fanning  Exercises - Seated Ankle Alphabet  - 1 x daily - 7 x weekly - 1 sets - 1 reps - a-z hold - Seated Heel Raise  - 1 x daily - 7 x weekly - 1 sets - 20 reps - Seated Toe Raise  - 1 x daily - 7 x weekly - 3 sets - 10 reps - Gastroc Stretch on Wall  - 1 x daily - 7 x weekly - 3 sets - 10 reps - Soleus Stretch on Wall  - 1 x daily - 7 x weekly - 3 sets - 10 reps - Seated Ankle Circles  - 2 x daily - 7 x weekly - 2 sets - 10 reps - Seated Ankle Eversion with Resistance  - 1 x daily - 3-4 x weekly - 1 sets - 10 reps - Seated Ankle Dorsiflexion with Resistance  - 1 x daily - 4 x weekly - 2 sets - 10 reps - Seated Ankle Inversion with Anchored Resistance  - 1 x daily - 3-4 x weekly - 1 sets - 10 reps - Standing Heel Raises  - 1 x daily - 4 x weekly - 2 sets - 10 reps - 3 sec hold  Patient Education - Ionto Pt Instructions - [facility]  ASSESSMENT:  CLINICAL IMPRESSION: Joanmarie is demonstrating good tolerance to activities out of the boot.  She has minimal pain to speak of.  She was complaining of some soreness in achilles tendon, but no increased pain with TE. Mandee continues to demonstrate potential for improvement and would benefit from continued skilled therapy to address impairments.  She would benefit from continuing skilled PT for ankle stability.     OBJECTIVE IMPAIRMENTS: Abnormal gait, decreased balance, decreased endurance, decreased knowledge of use of DME, difficulty walking, decreased ROM, decreased strength, hypomobility,  increased fascial restrictions, increased muscle spasms, impaired flexibility, obesity, and pain.   ACTIVITY LIMITATIONS: carrying, lifting, bending, standing, squatting, sleeping, stairs, transfers, and dressing  PARTICIPATION LIMITATIONS: meal prep, cleaning, laundry, shopping, community activity, yard work, and church  PERSONAL FACTORS: Fitness, Past/current experiences, and 3+ comorbidities: MS, OA, and obesity  are also affecting patient's functional outcome.   REHAB POTENTIAL: Fair due to MS and obesity, treatment may be limited  CLINICAL DECISION MAKING: Evolving/moderate complexity  EVALUATION COMPLEXITY: Moderate   GOALS: Goals reviewed with patient? Yes  SHORT TERM GOALS: Target date: 05/24/2023  Pain report to be no greater than 4/10  Baseline: Goal status: MET 05/25/23  2.  Patient will be independent with initial HEP  Baseline:  Goal status: MET  3.  Patient to be able to walk with boot without cane safely Baseline:  Goal status: MET   LONG TERM GOALS: Target date: 06/21/2023   Patient to report pain no greater than 2/10  Baseline:  Goal status: INITIAL  2.  Patient to be independent with advanced HEP  Baseline:  Goal status: INITIAL  3.  Functional scores to improve by 2-3 seconds Baseline:  Goal status: INITIAL  4.  LEFS score to  improve to 50-54 Baseline:  Goal status: INITIAL  5.  Patient to be able to ambulate with normal heel to toe progression without boot with minimal pain no greater than 1/10 Baseline:  Goal status: INITIAL  6.  Patient to be able to resume prior exercise routine Baseline:  Goal status: INITIAL   PLAN:  PT FREQUENCY: 2x/week  PT DURATION: 8 weeks  PLANNED INTERVENTIONS: Therapeutic exercises, Therapeutic activity, Neuromuscular re-education, Balance training, Gait training, Patient/Family education, Self Care, Joint mobilization, Stair training, Orthotic/Fit training, DME instructions, Aquatic Therapy, Dry  Needling, Electrical stimulation, Cryotherapy, Splintting, Taping, Ultrasound, Ionotophoresis 4mg /ml Dexamethasone, Manual therapy, and Re-evaluation  PLAN FOR NEXT SESSION: Keep patient cooled off with ice pack or cool towel due to MS.  Held on  TEMPO heel raises today but could possibly resume if achilles soreness is improved,  IASTM and gastroc/soleus stretches on propped rocker board.   Balance in SLS, static and dynamic strength.  Ice if painful post session.   Victorino Dike B. Najat Olazabal, PT 06/01/23 3:30 PM  [facility] [address], [address] [place], [place] [address] Phone # [phone] Fax [phone]

## 2023-06-06 ENCOUNTER — Ambulatory Visit: Payer: 59 | Admitting: Physical Therapy

## 2023-06-06 DIAGNOSIS — R262 Difficulty in walking, not elsewhere classified: Secondary | ICD-10-CM

## 2023-06-06 DIAGNOSIS — M25572 Pain in left ankle and joints of left foot: Secondary | ICD-10-CM

## 2023-06-06 DIAGNOSIS — M6281 Muscle weakness (generalized): Secondary | ICD-10-CM

## 2023-06-06 NOTE — Therapy (Signed)
OUTPATIENT PHYSICAL THERAPY LOWER EXTREMITYTREATMENT   Patient Name: Meredith Wright MRN: [id number] DOB:02/22/1971, 52 y.o., female Today's Date: 06/06/2023   Progress Note Reporting Period 04/26/2023 to 06/06/23  See note below for Objective Data and Assessment of Progress/Goals.     END OF SESSION:  PT End of Session - 06/06/23 1231     Visit Number 10    Date for PT Re-Evaluation 06/21/23    Authorization Type [employer]    PT Start Time 1231    PT Stop Time 1315    PT Time Calculation (min) 44 min    Activity Tolerance Patient tolerated treatment well                Past Medical History:  Diagnosis Date   Arthritis    Joint pain    Multiple food allergies    Multiple sclerosis (HCC)    Sickle cell anemia (HCC)    trait   Past Surgical History:  Procedure Laterality Date   BRAIN SURGERY  11/07/1996   Biopsy -Dx MS   CESAREAN SECTION     Patient Active Problem List   Diagnosis Date Noted   Achilles tendinitis of left lower extremity 04/04/2023   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   Prediabetes 03/02/2023   Vitamin D deficiency 03/02/2023   Dietary counseling and surveillance 06/08/2022   Hypopigmentation 06/08/2022   Severe obesity (BMI >= 40) (HCC) 05/25/2022   Food allergy 05/04/2022   Lower extremity edema 03/22/2022   Bilateral hand pain 12/27/2021   Trigger thumb of left hand 12/27/2021   Essential hypertension 04/27/2021   Osteoarthritis of left knee 02/25/2021   Knee pain, bilateral 01/15/2021   BMI 40.0-44.9, adult (HCC) 01/15/2021   Sickle cell trait (HCC) 04/08/2016   DUB (dysfunctional uterine bleeding) 04/01/2014   Multiple sclerosis (HCC)     PCP: de [place], Raymond J, MD   REFERRING PROVIDER: de [place], Raymond J, MD  REFERRING DIAG: [phone] (ICD-10-CM) - Achilles tendinitis of left lower extremity  THERAPY DIAG:  Pain in left ankle and joints of left foot  Difficulty in walking, not elsewhere classified  Muscle  weakness (generalized)  Rationale for Evaluation and Treatment: Rehabilitation  ONSET DATE: 04/04/2023  SUBJECTIVE:   SUBJECTIVE STATEMENT: Fell on the stairs carrying groceries, fell forward. My husband was behind me.  Sore side of ankle.  My husband has been massaging it.  I'm doing the exercises.   I hope the doctor will take me out of the boot this week  Eval:Patient reports long history of MS and OA.  I was diagnosed with MS in my 20's.  I am disabled.  I like to work out at home but MD said not to workout due to my ankle.  Pain in left achilles for several months.  She is currently in a walking boot and in night splint at night.  She has pain at the distal achilles at the attachment site.  She hopes to be able to resume exercise and walk without pain.   PERTINENT HISTORY: MS and OA  PAIN:  Are you having pain?  4-5/10  PRECAUTIONS: Other: MS  WEIGHT BEARING RESTRICTIONS: No  FALLS:  Has patient fallen in last 6 months? Yes. Number of falls March, collapsed from being overheated.  She states overheating with MS patients cause [place] to collapse.   LIVING ENVIRONMENT: Lives with: lives with their spouse Lives in: House/apartment Stairs: Yes: Internal: 3 steps; on right going up and External: 3 steps; on  right going up Has following equipment at home: Quad cane small base  OCCUPATION: Disabled  PLOF: Independent, Independent with household mobility with device, Independent with community mobility with device, Independent with homemaking with ambulation, Independent with gait, and Independent with transfers  PATIENT GOALS: to be able to walk without pain and workout  NEXT MD VISIT: Aug 5, (sees DPM on Aug 1)  OBJECTIVE:   DIAGNOSTIC FINDINGS: Spur left achilles  PATIENT SURVEYS:  LEFS 44/80 7/30: 43/80  COGNITION: Overall cognitive status: Within functional limits for tasks assessed     SENSATION: WFL   POSTURE:  pes planus and valgus knee  PALPATION: Tender at  distal achilles left 05/02/23 tender along lateral malleolus ligaments and peroneal tendons; no pain with MMT  LOWER EXTREMITY ROM:  Active ROM Right eval Left eval 7/30  Hip flexion     Hip extension     Hip abduction     Hip adduction     Hip internal rotation     Hip external rotation     Knee flexion     Knee extension     Ankle dorsiflexion  10 10  Ankle plantarflexion  48 68  Ankle inversion  28 30  Ankle eversion  12 15   (Blank rows = not tested)  LOWER EXTREMITY MMT:  MMT Right eval Left 05/02/23 7/30  Hip flexion     Hip extension     Hip abduction     Hip adduction     Hip internal rotation     Hip external rotation     Knee flexion     Knee extension     Ankle dorsiflexion  4+ 4+  Ankle plantarflexion     Ankle inversion  4+ 4  Ankle eversion  4 4- painful   (Blank rows = not tested)   FUNCTIONAL TESTS:  5 times sit to stand: 19.41 sec Timed up and go (TUG): 14.32 7/30:  5 times sit to stand:  21.36 no hands              GAIT: Distance walked: 30 Assistive device utilized: Quad cane small base Level of assistance: Modified independence Comments: antalgic   TODAY'S TREATMENT:                                                                                                                              DATE:  06/06/23 ROM MMT LEFS Nustep L5 x 6 min goals reviewed  Standing heel raises  4 count tempo x 10  Rocker board x 2 min Step up and hold fwd then lateral x 10 6 inch step Gait training/ distance walking in normal shoe x 100 feet.    06/01/23 Nustep L5 x 6 min goals reviewed Traditional heel raises x 20 (held on temp due to achilles pain) Rocker board x 2 min Half roll inv/ev x 20 Seated toe and heel raises x 20 Step up and hold fwd  then lateral x 10 Seated rest break with ice to avoid over heating 4 way ankle tband with yellow tband  05/30/23 TEMPO heel raises (3 step up, 3 sec hold, 3 step down, x 10 then 90 sec rest) Seated toe  raises during rest 1, seated short foot arch sets during rest 2, and seated eversion during rest 3 Step up and 5 sec hold (SLS) 2 x 10 fwd and 2 x 10 lateral in // bars (first set on 6 inch, 2nd set on 4 inch) Gait training/ distance walking in normal shoe x 100 feet.  05/25/23 Nustep L5 x 6 min goals reviewed TEMPO heel raises (3 step up, 30 sec hold, 3 step down, x 10 then 90 sec rest) Seated (for rest break and applied ice to neck to keep patient cooled down) toe and heel raises Step up and hold 2 x 10 fwd and 2 x 10 lateral in // bars 4 way ankle tband with yellow tband Gait training/ distance walking in normal shoe x 100 feet.   PATIENT EDUCATION:  Education details: updated HEP Person educated: Patient Education method: Programmer, multimedia, Facilities manager, Verbal cues, and Handouts Education comprehension: verbalized understanding, returned demonstration, and verbal cues required  HOME EXERCISE PROGRAM: Access Code: [email or web address] URL: [email or web address] Date: 05/23/2023 Prepared by: Raynelle Fanning  Exercises - Seated Ankle Alphabet  - 1 x daily - 7 x weekly - 1 sets - 1 reps - a-z hold - Seated Heel Raise  - 1 x daily - 7 x weekly - 1 sets - 20 reps - Seated Toe Raise  - 1 x daily - 7 x weekly - 3 sets - 10 reps - Gastroc Stretch on Wall  - 1 x daily - 7 x weekly - 3 sets - 10 reps - Soleus Stretch on Wall  - 1 x daily - 7 x weekly - 3 sets - 10 reps - Seated Ankle Circles  - 2 x daily - 7 x weekly - 2 sets - 10 reps - Seated Ankle Eversion with Resistance  - 1 x daily - 3-4 x weekly - 1 sets - 10 reps - Seated Ankle Dorsiflexion with Resistance  - 1 x daily - 4 x weekly - 2 sets - 10 reps - Seated Ankle Inversion with Anchored Resistance  - 1 x daily - 3-4 x weekly - 1 sets - 10 reps - Standing Heel Raises  - 1 x daily - 4 x weekly - 2 sets - 10 reps - 3 sec hold  Patient Education - Ionto Pt Instructions - [facility]  ASSESSMENT:  CLINICAL IMPRESSION: The patient has improved  ankle ROM in all planes.  Increased lateral ankle pain since most recent fall affecting MMT and functional outcome measure.  She is highly motivated with exercise.  Therapist monitoring response to exercise to avoid over fatigue or overheating.  Progressing with rehab goals.    OBJECTIVE IMPAIRMENTS: Abnormal gait, decreased balance, decreased endurance, decreased knowledge of use of DME, difficulty walking, decreased ROM, decreased strength, hypomobility, increased fascial restrictions, increased muscle spasms, impaired flexibility, obesity, and pain.   ACTIVITY LIMITATIONS: carrying, lifting, bending, standing, squatting, sleeping, stairs, transfers, and dressing  PARTICIPATION LIMITATIONS: meal prep, cleaning, laundry, shopping, community activity, yard work, and church  PERSONAL FACTORS: Fitness, Past/current experiences, and 3+ comorbidities: MS, OA, and obesity  are also affecting patient's functional outcome.   REHAB POTENTIAL: Fair due to MS and obesity, treatment may be limited  CLINICAL DECISION MAKING: Evolving/moderate complexity  EVALUATION COMPLEXITY: Moderate  GOALS: Goals reviewed with patient? Yes  SHORT TERM GOALS: Target date: 05/24/2023  Pain report to be no greater than 4/10  Baseline: Goal status: MET 05/25/23  2.  Patient will be independent with initial HEP  Baseline:  Goal status: MET  3.  Patient to be able to walk with boot without cane safely Baseline:  Goal status: MET   LONG TERM GOALS: Target date: 06/21/2023   Patient to report pain no greater than 2/10  Baseline:  Goal status: INITIAL  2.  Patient to be independent with advanced HEP  Baseline:  Goal status: INITIAL  3.  Functional scores to improve by 2-3 seconds Baseline:  Goal status: INITIAL  4.  LEFS score to improve to 50-54 Baseline:  Goal status: INITIAL  5.  Patient to be able to ambulate with normal heel to toe progression without boot with minimal pain no greater than  1/10 Baseline:  Goal status: INITIAL  6.  Patient to be able to resume prior exercise routine Baseline:  Goal status: INITIAL   PLAN:  PT FREQUENCY: 2x/week  PT DURATION: 8 weeks  PLANNED INTERVENTIONS: Therapeutic exercises, Therapeutic activity, Neuromuscular re-education, Balance training, Gait training, Patient/Family education, Self Care, Joint mobilization, Stair training, Orthotic/Fit training, DME instructions, Aquatic Therapy, Dry Needling, Electrical stimulation, Cryotherapy, Splintting, Taping, Ultrasound, Ionotophoresis 4mg /ml Dexamethasone, Manual therapy, and Re-evaluation  PLAN FOR NEXT SESSION:  ice pack or cool towel due to MS as needed; IASTM and gastroc/soleus stretches on propped rocker board.   Balance in SLS, static and dynamic strength. Sees MD 8/1;   Ice if painful post session.   Lavinia Sharps, PT 06/06/23 1:19 PM Phone: [phone] Fax: [phone] [facility] [address] [place], [place] [address] Phone # [phone] Fax [phone]

## 2023-06-08 ENCOUNTER — Ambulatory Visit: Payer: 59 | Attending: Family Medicine | Admitting: Physical Therapy

## 2023-06-08 ENCOUNTER — Ambulatory Visit (INDEPENDENT_AMBULATORY_CARE_PROVIDER_SITE_OTHER): Payer: 59 | Admitting: Podiatry

## 2023-06-08 DIAGNOSIS — R262 Difficulty in walking, not elsewhere classified: Secondary | ICD-10-CM | POA: Diagnosis present

## 2023-06-08 DIAGNOSIS — M7661 Achilles tendinitis, right leg: Secondary | ICD-10-CM | POA: Diagnosis not present

## 2023-06-08 DIAGNOSIS — G8929 Other chronic pain: Secondary | ICD-10-CM | POA: Diagnosis present

## 2023-06-08 DIAGNOSIS — M25572 Pain in left ankle and joints of left foot: Secondary | ICD-10-CM | POA: Insufficient documentation

## 2023-06-08 DIAGNOSIS — M7662 Achilles tendinitis, left leg: Secondary | ICD-10-CM | POA: Diagnosis not present

## 2023-06-08 DIAGNOSIS — M25561 Pain in right knee: Secondary | ICD-10-CM | POA: Diagnosis present

## 2023-06-08 DIAGNOSIS — M25562 Pain in left knee: Secondary | ICD-10-CM | POA: Insufficient documentation

## 2023-06-08 DIAGNOSIS — R296 Repeated falls: Secondary | ICD-10-CM | POA: Diagnosis present

## 2023-06-08 DIAGNOSIS — M6281 Muscle weakness (generalized): Secondary | ICD-10-CM | POA: Insufficient documentation

## 2023-06-08 DIAGNOSIS — R252 Cramp and spasm: Secondary | ICD-10-CM | POA: Insufficient documentation

## 2023-06-08 NOTE — Therapy (Signed)
OUTPATIENT PHYSICAL THERAPY LOWER EXTREMITYTREATMENT   Patient Name: Meredith Wright MRN: [id number] DOB:06-Sep-1971, 52 y.o., female Today's Date: 06/08/2023     END OF SESSION:  PT End of Session - 06/08/23 1145     Visit Number 11    Date for PT Re-Evaluation 06/21/23    Authorization Type [employer]ICARE    PT Start Time 1145    PT Stop Time 1225    PT Time Calculation (min) 40 min    Activity Tolerance Patient tolerated treatment well                Past Medical History:  Diagnosis Date   Arthritis    Joint pain    Multiple food allergies    Multiple sclerosis (HCC)    Sickle cell anemia (HCC)    trait   Past Surgical History:  Procedure Laterality Date   BRAIN SURGERY  11/07/1996   Biopsy -Dx MS   CESAREAN SECTION     Patient Active Problem List   Diagnosis Date Noted   Achilles tendinitis of left lower extremity 04/04/2023   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   Prediabetes 03/02/2023   Vitamin D deficiency 03/02/2023   Dietary counseling and surveillance 06/08/2022   Hypopigmentation 06/08/2022   Severe obesity (BMI >= 40) (HCC) 05/25/2022   Food allergy 05/04/2022   Lower extremity edema 03/22/2022   Bilateral hand pain 12/27/2021   Trigger thumb of left hand 12/27/2021   Essential hypertension 04/27/2021   Osteoarthritis of left knee 02/25/2021   Knee pain, bilateral 01/15/2021   BMI 40.0-44.9, adult (HCC) 01/15/2021   Sickle cell trait (HCC) 04/08/2016   DUB (dysfunctional uterine bleeding) 04/01/2014   Multiple sclerosis (HCC)     PCP: de [place], Raymond J, MD   REFERRING PROVIDER: de [place], Raymond J, MD  REFERRING DIAG: [phone] (ICD-10-CM) - Achilles tendinitis of left lower extremity  THERAPY DIAG:  Pain in left ankle and joints of left foot  Difficulty in walking, not elsewhere classified  Muscle weakness (generalized)  Rationale for Evaluation and Treatment: Rehabilitation  ONSET DATE: 04/04/2023  SUBJECTIVE:    SUBJECTIVE STATEMENT: Sees the doctor this afternoon.  Hoping to get rid of the boot.  I had to sit to watch my family bowl.  Denies excessive pain, just Achilles discomfort.   Eval:Patient reports long history of MS and OA.  I was diagnosed with MS in my 20's.  I am disabled.  I like to work out at home but MD said not to workout due to my ankle.  Pain in left achilles for several months.  She is currently in a walking boot and in night splint at night.  She has pain at the distal achilles at the attachment site.  She hopes to be able to resume exercise and walk without pain.   PERTINENT HISTORY: MS and OA  PAIN:  Are you having pain?  2/10   Achilles area  PRECAUTIONS: Other: MS  WEIGHT BEARING RESTRICTIONS: No  FALLS:  Has patient fallen in last 6 months? Yes. Number of falls March, collapsed from being overheated.  She states overheating with MS patients cause [place] to collapse.   LIVING ENVIRONMENT: Lives with: lives with their spouse Lives in: House/apartment Stairs: Yes: Internal: 3 steps; on right going up and External: 3 steps; on right going up Has following equipment at home: Quad cane small base  OCCUPATION: Disabled  PLOF: Independent, Independent with household mobility with device, Independent with community mobility with device,  Independent with homemaking with ambulation, Independent with gait, and Independent with transfers  PATIENT GOALS: to be able to walk without pain and workout  NEXT MD VISIT: Aug 5, (sees DPM on Aug 1)  OBJECTIVE:   DIAGNOSTIC FINDINGS: Spur left achilles  PATIENT SURVEYS:  LEFS 44/80 7/30: 43/80  COGNITION: Overall cognitive status: Within functional limits for tasks assessed     SENSATION: WFL   POSTURE:  pes planus and valgus knee  PALPATION: Tender at distal achilles left 05/02/23 tender along lateral malleolus ligaments and peroneal tendons; no pain with MMT  LOWER EXTREMITY ROM:  Active ROM Right eval Left eval  7/30  Hip flexion     Hip extension     Hip abduction     Hip adduction     Hip internal rotation     Hip external rotation     Knee flexion     Knee extension     Ankle dorsiflexion  10 10  Ankle plantarflexion  48 68  Ankle inversion  28 30  Ankle eversion  12 15   (Blank rows = not tested)  LOWER EXTREMITY MMT:  MMT Right eval Left 05/02/23 7/30  Hip flexion     Hip extension     Hip abduction     Hip adduction     Hip internal rotation     Hip external rotation     Knee flexion     Knee extension     Ankle dorsiflexion  4+ 4+  Ankle plantarflexion     Ankle inversion  4+ 4  Ankle eversion  4 4- painful   (Blank rows = not tested)   FUNCTIONAL TESTS:  5 times sit to stand: 19.41 sec Timed up and go (TUG): 14.32 7/30:  5 times sit to stand:  21.36 no hands              GAIT: Distance walked: 30 Assistive device utilized: Quad cane small base Level of assistance: Modified independence Comments: antalgic   TODAY'S TREATMENT:                                                                                                                              DATE:  06/08/23 Nustep L1 x 9 min while discussing status WB on left with right touches to circles random combos (light touch on railing) Staggered stance heel raises x 5 each way Box formation heel raises: shift right, up, shift left and lower 5x each direction Discussed HEP red band ex sheet provided by [facility]  Standing floor slider to work on toe extension 10x Standing "short foot" arch lift 10x  06/06/23 ROM MMT LEFS Nustep L5 x 6 min goals reviewed  Standing heel raises  4 count tempo x 10  Rocker board x 2 min Step up and hold fwd then lateral x 10 6 inch step Gait training/ distance walking in normal shoe x 100 feet.  06/01/23 Nustep L5 x 6 min goals reviewed Traditional heel raises x 20 (held on temp due to achilles pain) Rocker board x 2 min Half roll inv/ev x 20 Seated toe  and heel raises x 20 Step up and hold fwd then lateral x 10 Seated rest break with ice to avoid over heating 4 way ankle tband with yellow tband  05/30/23 TEMPO heel raises (3 step up, 3 sec hold, 3 step down, x 10 then 90 sec rest) Seated toe raises during rest 1, seated short foot arch sets during rest 2, and seated eversion during rest 3 Step up and 5 sec hold (SLS) 2 x 10 fwd and 2 x 10 lateral in // bars (first set on 6 inch, 2nd set on 4 inch) Gait training/ distance walking in normal shoe x 100 feet.   PATIENT EDUCATION:  Education details: updated HEP Person educated: Patient Education method: Programmer, multimedia, Facilities manager, Verbal cues, and Handouts Education comprehension: verbalized understanding, returned demonstration, and verbal cues required  HOME EXERCISE PROGRAM: Access Code: [email or web address] URL: [email or web address] Date: 05/23/2023 Prepared by: Raynelle Fanning  Exercises - Seated Ankle Alphabet  - 1 x daily - 7 x weekly - 1 sets - 1 reps - a-z hold - Seated Heel Raise  - 1 x daily - 7 x weekly - 1 sets - 20 reps - Seated Toe Raise  - 1 x daily - 7 x weekly - 3 sets - 10 reps - Gastroc Stretch on Wall  - 1 x daily - 7 x weekly - 3 sets - 10 reps - Soleus Stretch on Wall  - 1 x daily - 7 x weekly - 3 sets - 10 reps - Seated Ankle Circles  - 2 x daily - 7 x weekly - 2 sets - 10 reps - Seated Ankle Eversion with Resistance  - 1 x daily - 3-4 x weekly - 1 sets - 10 reps - Seated Ankle Dorsiflexion with Resistance  - 1 x daily - 4 x weekly - 2 sets - 10 reps - Seated Ankle Inversion with Anchored Resistance  - 1 x daily - 3-4 x weekly - 1 sets - 10 reps - Standing Heel Raises  - 1 x daily - 4 x weekly - 2 sets - 10 reps - 3 sec hold  Patient Education - [place] Pt Instructions - [facility]  ASSESSMENT:  CLINICAL IMPRESSION: The patient is able to progress with intensity and challenge level of ex's today stating "they feel good to do."  She reports appropriate pulling  sensation in soft tissue but not painful in the Achilles region.  She is eager for her doctor's appt today and is hopeful she will be released from wearing the boot.  We also discussed her band resisted ex program provided by [facility] for upper and lower body and she feels she can do all in 30 min which we agreed was reasonable.  Therapist closely monitoring pain levels and for excessive fatigue related to MS.    OBJECTIVE IMPAIRMENTS: Abnormal gait, decreased balance, decreased endurance, decreased knowledge of use of DME, difficulty walking, decreased ROM, decreased strength, hypomobility, increased fascial restrictions, increased muscle spasms, impaired flexibility, obesity, and pain.   ACTIVITY LIMITATIONS: carrying, lifting, bending, standing, squatting, sleeping, stairs, transfers, and dressing  PARTICIPATION LIMITATIONS: meal prep, cleaning, laundry, shopping, community activity, yard work, and church  PERSONAL FACTORS: Fitness, Past/current experiences, and 3+ comorbidities: MS, OA, and obesity  are also affecting patient's functional outcome.   REHAB POTENTIAL:  Fair due to MS and obesity, treatment may be limited  CLINICAL DECISION MAKING: Evolving/moderate complexity  EVALUATION COMPLEXITY: Moderate   GOALS: Goals reviewed with patient? Yes  SHORT TERM GOALS: Target date: 05/24/2023  Pain report to be no greater than 4/10  Baseline: Goal status: MET 05/25/23  2.  Patient will be independent with initial HEP  Baseline:  Goal status: MET  3.  Patient to be able to walk with boot without cane safely Baseline:  Goal status: MET   LONG TERM GOALS: Target date: 06/21/2023   Patient to report pain no greater than 2/10  Baseline:  Goal status: INITIAL  2.  Patient to be independent with advanced HEP  Baseline:  Goal status: INITIAL  3.  Functional scores to improve by 2-3 seconds Baseline:  Goal status: INITIAL  4.  LEFS score to improve to  50-54 Baseline:  Goal status: INITIAL  5.  Patient to be able to ambulate with normal heel to toe progression without boot with minimal pain no greater than 1/10 Baseline:  Goal status: INITIAL  6.  Patient to be able to resume prior exercise routine Baseline:  Goal status: INITIAL   PLAN:  PT FREQUENCY: 2x/week  PT DURATION: 8 weeks  PLANNED INTERVENTIONS: Therapeutic exercises, Therapeutic activity, Neuromuscular re-education, Balance training, Gait training, Patient/Family education, Self Care, Joint mobilization, Stair training, Orthotic/Fit training, DME instructions, Aquatic Therapy, Dry Needling, Electrical stimulation, Cryotherapy, Splintting, Taping, Ultrasound, Ionotophoresis 4mg /ml Dexamethasone, Manual therapy, and Re-evaluation  PLAN FOR NEXT SESSION:  see how MD appt went; ice pack or cool towel due to MS as needed; IASTM and gastroc/soleus stretches on propped rocker board.   Balance in SLS, static and dynamic strength.   Ice if painful post session.   Lavinia Sharps, PT 06/08/23 7:26 PM Phone: [phone] Fax: [phone]  [facility] [address], [address] [place], [place] [address] Phone # [phone] Fax [phone]

## 2023-06-10 NOTE — Progress Notes (Signed)
She presents today for follow-up of her Achilles tendinitis and states that she is doing much better particularly with physical therapy.  She states that is still sore but is not sore every day and is not to the same severity.  Objective: No reproducible pain other than some mild tenderness on the posterior superior aspect central calcaneus.  It is not warm to the touch does not appear to be fluctuant.  Assessment: Resolving tendinitis of the Achilles.  Plan: Discussed etiology pathology conservative surgical therapies, follow-up with her at the end of August if not improved MRI been necessary.

## 2023-06-12 ENCOUNTER — Encounter (HOSPITAL_BASED_OUTPATIENT_CLINIC_OR_DEPARTMENT_OTHER): Payer: Self-pay | Admitting: Family Medicine

## 2023-06-12 ENCOUNTER — Ambulatory Visit (INDEPENDENT_AMBULATORY_CARE_PROVIDER_SITE_OTHER): Payer: 59 | Admitting: Family Medicine

## 2023-06-12 VITALS — BP 136/86 | HR 79 | Ht 67.0 in | Wt 309.0 lb

## 2023-06-12 DIAGNOSIS — M7662 Achilles tendinitis, left leg: Secondary | ICD-10-CM

## 2023-06-12 DIAGNOSIS — I10 Essential (primary) hypertension: Secondary | ICD-10-CM

## 2023-06-12 MED ORDER — VALSARTAN 160 MG PO TABS: 160.0000 mg | ORAL_TABLET | Freq: Every day | ORAL | 1 refills | Status: DC

## 2023-06-12 NOTE — Assessment & Plan Note (Signed)
Patient has established with podiatrist, currently in a short leg walking boot.  Continues to have some symptoms.  She has also been working with physical therapy, will continue seeing him moving forward.  She did try to go without the boot previously and had increase in symptoms without it. Recommend continued follow-up with podiatrist, physical therapy.  I do feel the patient would benefit from continued physical therapy services Also discussed consideration of heel lifts in her shoe if she is looking to transition out of the boot.  Additionally could discuss with her podiatrist about possibility of nitro patches to be used on the Achilles tendon and whether this is something they would recommend

## 2023-06-12 NOTE — Assessment & Plan Note (Signed)
Blood pressure controlled in office today, she continues with amlodipine and valsartan.  Denies any issues with medications, no chest pain, headaches. Discussed options, given adequate control and continue to work on lifestyle modifications, can continue with current medication regimen with no changes to be made today. Recommend intermittent monitoring of blood pressure at home, DASH diet

## 2023-06-12 NOTE — Patient Instructions (Signed)
  Medication Instructions:  Your physician recommends that you continue on your current medications as directed. Please refer to the Current Medication list given to you today. --If you need a refill on any your medications before your next appointment, please call your pharmacy first. If no refills are authorized on file call the office.-- Lab Work: Your physician has recommended that you have lab work today: No If you have labs (blood work) drawn today and your tests are completely normal, you will receive your results via [place] a phone call from our staff.  Please ensure you check your voicemail in the event that you authorized detailed messages to be left on a delegated number. If you have any lab test that is abnormal or we need to change your treatment, we will call you to review the results.  Referrals/Procedures/Imaging: No  Follow-Up: Your next appointment:   Your physician recommends that you schedule a follow-up appointment in: 3-4 months with Dr. de [place].  You will receive a text message or e-mail with a link to a survey about your care and experience with [place] today! We would greatly appreciate your feedback!   Thanks for letting us be apart of your health journey!!  Primary Care and Sports Medicine   Dr. Arlina Robes [place]   We encourage you to activate your patient portal called "MyChart".  Sign up information is provided on this After Visit Summary.  MyChart is used to connect with patients for Virtual Visits (Telemedicine).  Patients are able to view lab/test results, encounter notes, upcoming appointments, etc.  Non-urgent messages can be sent to your provider as well. To learn more about what you can do with MyChart, please visit --  [email or web address].

## 2023-06-12 NOTE — Progress Notes (Signed)
    Procedures performed today:    None.  Independent interpretation of notes and tests performed by another provider:   None.  Brief History, Exam, Impression, and Recommendations:    BP 136/86   Pulse 79   Ht 5\' 7"  (1.702 m)   Wt (!) 309 lb (140.2 kg)   SpO2 100%   BMI 48.40 kg/m   Achilles tendinitis of left lower extremity Assessment & Plan: Patient has established with podiatrist, currently in a short leg walking boot.  Continues to have some symptoms.  She has also been working with physical therapy, will continue seeing him moving forward.  She did try to go without the boot previously and had increase in symptoms without it. Recommend continued follow-up with podiatrist, physical therapy.  I do feel the patient would benefit from continued physical therapy services Also discussed consideration of heel lifts in her shoe if she is looking to transition out of the boot.  Additionally could discuss with her podiatrist about possibility of nitro patches to be used on the Achilles tendon and whether this is something they would recommend   Primary hypertension -     Valsartan; Take 1 tablet (160 mg total) by mouth daily.  Dispense: 90 tablet; Refill: 1  Essential hypertension Assessment & Plan: Blood pressure controlled in office today, she continues with amlodipine and valsartan.  Denies any issues with medications, no chest pain, headaches. Discussed options, given adequate control and continue to work on lifestyle modifications, can continue with current medication regimen with no changes to be made today. Recommend intermittent monitoring of blood pressure at home, DASH diet   Return in about 3 months (around 09/12/2023) for hypertension.   ___________________________________________  de [place], MD, ABFM, CAQSM Primary Care and Sports Medicine [facility]

## 2023-06-13 ENCOUNTER — Ambulatory Visit: Payer: 59

## 2023-06-13 DIAGNOSIS — G8929 Other chronic pain: Secondary | ICD-10-CM

## 2023-06-13 DIAGNOSIS — M25572 Pain in left ankle and joints of left foot: Secondary | ICD-10-CM

## 2023-06-13 DIAGNOSIS — R296 Repeated falls: Secondary | ICD-10-CM

## 2023-06-13 DIAGNOSIS — M6281 Muscle weakness (generalized): Secondary | ICD-10-CM

## 2023-06-13 DIAGNOSIS — R262 Difficulty in walking, not elsewhere classified: Secondary | ICD-10-CM

## 2023-06-13 DIAGNOSIS — R252 Cramp and spasm: Secondary | ICD-10-CM

## 2023-06-13 NOTE — Therapy (Signed)
OUTPATIENT PHYSICAL THERAPY LOWER EXTREMITYTREATMENT   Patient Name: Meredith Wright MRN: [id number] DOB:September 20, 1971, 52 y.o., female Today's Date: 06/13/2023     END OF SESSION:  PT End of Session - 06/13/23 1432     Visit Number 12    Date for PT Re-Evaluation 06/21/23    Authorization Type [employer]    PT Start Time 1409    PT Stop Time 1445    PT Time Calculation (min) 36 min    Activity Tolerance Patient tolerated treatment well;Other (comment)   .limited by MS   Behavior During Therapy [facility] for tasks assessed/performed                Past Medical History:  Diagnosis Date   Arthritis    Joint pain    Multiple food allergies    Multiple sclerosis (HCC)    Sickle cell anemia (HCC)    trait   Past Surgical History:  Procedure Laterality Date   BRAIN SURGERY  11/07/1996   Biopsy -Dx MS   CESAREAN SECTION     Patient Active Problem List   Diagnosis Date Noted   Achilles tendinitis of left lower extremity 04/04/2023   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   Prediabetes 03/02/2023   Vitamin D deficiency 03/02/2023   Dietary counseling and surveillance 06/08/2022   Hypopigmentation 06/08/2022   Severe obesity (BMI >= 40) (HCC) 05/25/2022   Food allergy 05/04/2022   Lower extremity edema 03/22/2022   Bilateral hand pain 12/27/2021   Trigger thumb of left hand 12/27/2021   Essential hypertension 04/27/2021   Osteoarthritis of left knee 02/25/2021   Knee pain, bilateral 01/15/2021   BMI 40.0-44.9, adult (HCC) 01/15/2021   Sickle cell trait (HCC) 04/08/2016   DUB (dysfunctional uterine bleeding) 04/01/2014   Multiple sclerosis (HCC)     PCP: de [place], Raymond J, MD   REFERRING PROVIDER: de [place], Raymond J, MD  REFERRING DIAG: [phone] (ICD-10-CM) - Achilles tendinitis of left lower extremity  THERAPY DIAG:  Pain in left ankle and joints of left foot  Difficulty in walking, not elsewhere classified  Muscle weakness (generalized)  Repeated  falls  Chronic pain of left knee  Cramp and spasm  Chronic pain of right knee  Rationale for Evaluation and Treatment: Rehabilitation  ONSET DATE: 04/04/2023  SUBJECTIVE:   SUBJECTIVE STATEMENT: Saw MD for f/u.  Per MD, ok to wean from boot.  Patient attempted to do so but had significant increase in pain.  She is concerned that she won't be able to get out of the boot.     Eval:Patient reports long history of MS and OA.  I was diagnosed with MS in my 20's.  I am disabled.  I like to work out at home but MD said not to workout due to my ankle.  Pain in left achilles for several months.  She is currently in a walking boot and in night splint at night.  She has pain at the distal achilles at the attachment site.  She hopes to be able to resume exercise and walk without pain.   PERTINENT HISTORY: MS and OA  PAIN:  Are you having pain?  2/10   Achilles area  PRECAUTIONS: Other: MS  WEIGHT BEARING RESTRICTIONS: No  FALLS:  Has patient fallen in last 6 months? Yes. Number of falls March, collapsed from being overheated.  She states overheating with MS patients cause [place] to collapse.   LIVING ENVIRONMENT: Lives with: lives with their spouse Lives in:  House/apartment Stairs: Yes: Internal: 3 steps; on right going up and External: 3 steps; on right going up Has following equipment at home: Quad cane small base  OCCUPATION: Disabled  PLOF: Independent, Independent with household mobility with device, Independent with community mobility with device, Independent with homemaking with ambulation, Independent with gait, and Independent with transfers  PATIENT GOALS: to be able to walk without pain and workout  NEXT MD VISIT: Aug 5, (sees DPM on Aug 1)  OBJECTIVE:   DIAGNOSTIC FINDINGS: Spur left achilles  PATIENT SURVEYS:  LEFS 44/80 7/30: 43/80  COGNITION: Overall cognitive status: Within functional limits for tasks assessed     SENSATION: WFL   POSTURE:  pes planus and  valgus knee  PALPATION: Tender at distal achilles left 05/02/23 tender along lateral malleolus ligaments and peroneal tendons; no pain with MMT  LOWER EXTREMITY ROM:  Active ROM Right eval Left eval 7/30  Hip flexion     Hip extension     Hip abduction     Hip adduction     Hip internal rotation     Hip external rotation     Knee flexion     Knee extension     Ankle dorsiflexion  10 10  Ankle plantarflexion  48 68  Ankle inversion  28 30  Ankle eversion  12 15   (Blank rows = not tested)  LOWER EXTREMITY MMT:  MMT Right eval Left 05/02/23 7/30  Hip flexion     Hip extension     Hip abduction     Hip adduction     Hip internal rotation     Hip external rotation     Knee flexion     Knee extension     Ankle dorsiflexion  4+ 4+  Ankle plantarflexion     Ankle inversion  4+ 4  Ankle eversion  4 4- painful   (Blank rows = not tested)   FUNCTIONAL TESTS:  5 times sit to stand: 19.41 sec Timed up and go (TUG): 14.32 7/30:  5 times sit to stand:  21.36 no hands              GAIT: Distance walked: 30 Assistive device utilized: Quad cane small base Level of assistance: Modified independence Comments: antalgic   TODAY'S TREATMENT:                                                                                                                              DATE:  06/13/23 Rocker board x 2 min Gastroc stretch on rocker board x 10 holding 10 sec Soleus stretch on rocker board x 10 holding 10 sec Lengthy discussion about how to wean out of boot and possible orthotic with small heel lift.  Manual gastroc solues stretching in prone, STM, IASTM, Cupping to entire gastroc/soleus and achilles area.   06/08/23 Nustep L1 x 9 min while discussing status WB on left with right touches to circles random combos (  light touch on railing) Staggered stance heel raises x 5 each way Box formation heel raises: shift right, up, shift left and lower 5x each direction Discussed HEP red  band ex sheet provided by [facility] and Wellness  Standing floor slider to work on toe extension 10x Standing "short foot" arch lift 10x  06/06/23 ROM MMT LEFS Nustep L5 x 6 min goals reviewed  Standing heel raises  4 count tempo x 10  Rocker board x 2 min Step up and hold fwd then lateral x 10 6 inch step Gait training/ distance walking in normal shoe x 100 feet.   PATIENT EDUCATION:  Education details: updated HEP Person educated: Patient Education method: Programmer, multimedia, Facilities manager, Verbal cues, and Handouts Education comprehension: verbalized understanding, returned demonstration, and verbal cues required  HOME EXERCISE PROGRAM: Access Code: [email or web address] URL: [email or web address] Date: 05/23/2023 Prepared by: Raynelle Fanning  Exercises - Seated Ankle Alphabet  - 1 x daily - 7 x weekly - 1 sets - 1 reps - a-z hold - Seated Heel Raise  - 1 x daily - 7 x weekly - 1 sets - 20 reps - Seated Toe Raise  - 1 x daily - 7 x weekly - 3 sets - 10 reps - Gastroc Stretch on Wall  - 1 x daily - 7 x weekly - 3 sets - 10 reps - Soleus Stretch on Wall  - 1 x daily - 7 x weekly - 3 sets - 10 reps - Seated Ankle Circles  - 2 x daily - 7 x weekly - 2 sets - 10 reps - Seated Ankle Eversion with Resistance  - 1 x daily - 3-4 x weekly - 1 sets - 10 reps - Seated Ankle Dorsiflexion with Resistance  - 1 x daily - 4 x weekly - 2 sets - 10 reps - Seated Ankle Inversion with Anchored Resistance  - 1 x daily - 3-4 x weekly - 1 sets - 10 reps - Standing Heel Raises  - 1 x daily - 4 x weekly - 2 sets - 10 reps - 3 sec hold  Patient Education - Ionto Pt Instructions - [facility]  ASSESSMENT:  CLINICAL IMPRESSION: Patient was progressing well with weaning from boot but tried to go completely away from the boot for an extended amount of time.  She had return of pain.  We discussed a more gradual wean from the boot and added in more manual work today.  We will shift to more manual techniques for the next  few visits.  We will likely need to recertify and extend her POC.    OBJECTIVE IMPAIRMENTS: Abnormal gait, decreased balance, decreased endurance, decreased knowledge of use of DME, difficulty walking, decreased ROM, decreased strength, hypomobility, increased fascial restrictions, increased muscle spasms, impaired flexibility, obesity, and pain.   ACTIVITY LIMITATIONS: carrying, lifting, bending, standing, squatting, sleeping, stairs, transfers, and dressing  PARTICIPATION LIMITATIONS: meal prep, cleaning, laundry, shopping, community activity, yard work, and church  PERSONAL FACTORS: Fitness, Past/current experiences, and 3+ comorbidities: MS, OA, and obesity  are also affecting patient's functional outcome.   REHAB POTENTIAL: Fair due to MS and obesity, treatment may be limited  CLINICAL DECISION MAKING: Evolving/moderate complexity  EVALUATION COMPLEXITY: Moderate   GOALS: Goals reviewed with patient? Yes  SHORT TERM GOALS: Target date: 05/24/2023  Pain report to be no greater than 4/10  Baseline: Goal status: MET 05/25/23  2.  Patient will be independent with initial HEP  Baseline:  Goal status: MET  3.  Patient to  be able to walk with boot without cane safely Baseline:  Goal status: MET   LONG TERM GOALS: Target date: 06/21/2023   Patient to report pain no greater than 2/10  Baseline:  Goal status: INITIAL  2.  Patient to be independent with advanced HEP  Baseline:  Goal status: INITIAL  3.  Functional scores to improve by 2-3 seconds Baseline:  Goal status: INITIAL  4.  LEFS score to improve to 50-54 Baseline:  Goal status: INITIAL  5.  Patient to be able to ambulate with normal heel to toe progression without boot with minimal pain no greater than 1/10 Baseline:  Goal status: INITIAL  6.  Patient to be able to resume prior exercise routine Baseline:  Goal status: INITIAL   PLAN:  PT FREQUENCY: 2x/week  PT DURATION: 8 weeks  PLANNED  INTERVENTIONS: Therapeutic exercises, Therapeutic activity, Neuromuscular re-education, Balance training, Gait training, Patient/Family education, Self Care, Joint mobilization, Stair training, Orthotic/Fit training, DME instructions, Aquatic Therapy, Dry Needling, Electrical stimulation, Cryotherapy, Splintting, Taping, Ultrasound, Ionotophoresis 4mg /ml Dexamethasone, Manual therapy, and Re-evaluation  PLAN FOR NEXT SESSION:  Ice pack or cool towel due to MS as needed; IASTM and gastroc/soleus stretches on propped rocker board.   Balance in SLS, static and dynamic strength.   Ice if painful post session.   Victorino Dike B. , PT 06/13/23 6:26 PM [facility] [address] [place], [place] [address] Phone # [phone] Fax [phone]

## 2023-06-15 ENCOUNTER — Ambulatory Visit: Payer: 59

## 2023-06-15 DIAGNOSIS — M25572 Pain in left ankle and joints of left foot: Secondary | ICD-10-CM | POA: Diagnosis not present

## 2023-06-15 DIAGNOSIS — R262 Difficulty in walking, not elsewhere classified: Secondary | ICD-10-CM

## 2023-06-15 DIAGNOSIS — G8929 Other chronic pain: Secondary | ICD-10-CM

## 2023-06-15 DIAGNOSIS — R296 Repeated falls: Secondary | ICD-10-CM

## 2023-06-15 DIAGNOSIS — M6281 Muscle weakness (generalized): Secondary | ICD-10-CM

## 2023-06-15 NOTE — Therapy (Signed)
OUTPATIENT PHYSICAL THERAPY LOWER EXTREMITYTREATMENT   Patient Name: Meredith Wright MRN: [id number] DOB:15-Oct-1971, 52 y.o., female Today's Date: 06/15/2023     END OF SESSION:  PT End of Session - 06/15/23 1229     Visit Number 13    Date for PT Re-Evaluation 06/21/23    Authorization Type [employer]    PT Start Time 1230    PT Stop Time 1315    PT Time Calculation (min) 45 min    Activity Tolerance Patient tolerated treatment well;Other (comment)    Behavior During Therapy WFL for tasks assessed/performed                Past Medical History:  Diagnosis Date   Arthritis    Joint pain    Multiple food allergies    Multiple sclerosis (HCC)    Sickle cell anemia (HCC)    trait   Past Surgical History:  Procedure Laterality Date   BRAIN SURGERY  11/07/1996   Biopsy -Dx MS   CESAREAN SECTION     Patient Active Problem List   Diagnosis Date Noted   Achilles tendinitis of left lower extremity 04/04/2023   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   Prediabetes 03/02/2023   Vitamin D deficiency 03/02/2023   Dietary counseling and surveillance 06/08/2022   Hypopigmentation 06/08/2022   Severe obesity (BMI >= 40) (HCC) 05/25/2022   Food allergy 05/04/2022   Lower extremity edema 03/22/2022   Bilateral hand pain 12/27/2021   Trigger thumb of left hand 12/27/2021   Essential hypertension 04/27/2021   Osteoarthritis of left knee 02/25/2021   Knee pain, bilateral 01/15/2021   BMI 40.0-44.9, adult (HCC) 01/15/2021   Sickle cell trait (HCC) 04/08/2016   DUB (dysfunctional uterine bleeding) 04/01/2014   Multiple sclerosis (HCC)     PCP: de [place], Raymond J, MD   REFERRING PROVIDER: de [place], Raymond J, MD  REFERRING DIAG: [phone] (ICD-10-CM) - Achilles tendinitis of left lower extremity  THERAPY DIAG:  Pain in left ankle and joints of left foot  Difficulty in walking, not elsewhere classified  Muscle weakness (generalized)  Repeated falls  Chronic  pain of left knee  Rationale for Evaluation and Treatment: Rehabilitation  ONSET DATE: 04/04/2023  SUBJECTIVE:   SUBJECTIVE STATEMENT: Patient reports "I'm better!"  Pain reported at 2/10.  "I am out of the boot most of the time."       Eval:Patient reports long history of MS and OA.  I was diagnosed with MS in my 20's.  I am disabled.  I like to work out at home but MD said not to workout due to my ankle.  Pain in left achilles for several months.  She is currently in a walking boot and in night splint at night.  She has pain at the distal achilles at the attachment site.  She hopes to be able to resume exercise and walk without pain.   PERTINENT HISTORY: MS and OA  PAIN:  Are you having pain?  2/10   Achilles area  PRECAUTIONS: Other: MS  WEIGHT BEARING RESTRICTIONS: No  FALLS:  Has patient fallen in last 6 months? Yes. Number of falls March, collapsed from being overheated.  She states overheating with MS patients cause [place] to collapse.   LIVING ENVIRONMENT: Lives with: lives with their spouse Lives in: House/apartment Stairs: Yes: Internal: 3 steps; on right going up and External: 3 steps; on right going up Has following equipment at home: Quad cane small base  OCCUPATION: Disabled  PLOF: Independent, Independent with household mobility with device, Independent with community mobility with device, Independent with homemaking with ambulation, Independent with gait, and Independent with transfers  PATIENT GOALS: to be able to walk without pain and workout  NEXT MD VISIT: Aug 5, (sees DPM on Aug 1)  OBJECTIVE:   DIAGNOSTIC FINDINGS: Spur left achilles  PATIENT SURVEYS:  LEFS 44/80 7/30: 43/80  COGNITION: Overall cognitive status: Within functional limits for tasks assessed     SENSATION: WFL   POSTURE:  pes planus and valgus knee  PALPATION: Tender at distal achilles left 05/02/23 tender along lateral malleolus ligaments and peroneal tendons; no pain with  MMT  LOWER EXTREMITY ROM:  Active ROM Right eval Left eval 7/30  Hip flexion     Hip extension     Hip abduction     Hip adduction     Hip internal rotation     Hip external rotation     Knee flexion     Knee extension     Ankle dorsiflexion  10 10  Ankle plantarflexion  48 68  Ankle inversion  28 30  Ankle eversion  12 15   (Blank rows = not tested)  LOWER EXTREMITY MMT:  MMT Right eval Left 05/02/23 7/30  Hip flexion     Hip extension     Hip abduction     Hip adduction     Hip internal rotation     Hip external rotation     Knee flexion     Knee extension     Ankle dorsiflexion  4+ 4+  Ankle plantarflexion     Ankle inversion  4+ 4  Ankle eversion  4 4- painful   (Blank rows = not tested)   FUNCTIONAL TESTS:  5 times sit to stand: 19.41 sec Timed up and go (TUG): 14.32 7/30:  5 times sit to stand:  21.36 no hands              GAIT: Distance walked: 30 Assistive device utilized: Quad cane small base Level of assistance: Modified independence Comments: antalgic   TODAY'S TREATMENT:                                                                                                                              DATE:  06/15/23: Nustep x 5 min level 5 Seated toe and heel raises x 20 each Manual gastroc solues stretching in prone, STM, IASTM, Cupping to entire gastroc/soleus and achilles area.  Trigger Point Dry-Needling  Treatment instructions: Expect mild to moderate muscle soreness. S/S of pneumothorax if dry needled over a lung field, and to seek immediate medical attention should they occur. Patient verbalized understanding of these instructions and education. Patient Consent Given: Yes Education handout provided: Yes Muscles treated: left gastroc Electrical stimulation performed: No Parameters: N/A Treatment response/outcome: Skilled palpation used to identify taut bands and trigger points.  Once identified, dry needling techniques used to treat these  areas.  Twitch response ellicited along with palpable elongation of muscle.  Following treatment, patient reports mild soreness but demonstrates minimally antalgic gait.    06/13/23 Rocker board x 2 min Gastroc stretch on rocker board x 10 holding 10 sec Soleus stretch on rocker board x 10 holding 10 sec Lengthy discussion about how to wean out of boot and possible orthotic with small heel lift.  Manual gastroc solues stretching in prone, STM, IASTM, Cupping to entire gastroc/soleus and achilles area.   06/08/23 Nustep L1 x 9 min while discussing status WB on left with right touches to circles random combos (light touch on railing) Staggered stance heel raises x 5 each way Box formation heel raises: shift right, up, shift left and lower 5x each direction Discussed HEP red band ex sheet provided by [facility] and Wellness  Standing floor slider to work on toe extension 10x Standing "short foot" arch lift 10x    PATIENT EDUCATION:  Education details: updated HEP Person educated: Patient Education method: Programmer, multimedia, Facilities manager, Verbal cues, and Handouts Education comprehension: verbalized understanding, returned demonstration, and verbal cues required  HOME EXERCISE PROGRAM: Access Code: [email or web address] URL: [email or web address] Date: 05/23/2023 Prepared by: Raynelle Fanning  Exercises - Seated Ankle Alphabet  - 1 x daily - 7 x weekly - 1 sets - 1 reps - a-z hold - Seated Heel Raise  - 1 x daily - 7 x weekly - 1 sets - 20 reps - Seated Toe Raise  - 1 x daily - 7 x weekly - 3 sets - 10 reps - Gastroc Stretch on Wall  - 1 x daily - 7 x weekly - 3 sets - 10 reps - Soleus Stretch on Wall  - 1 x daily - 7 x weekly - 3 sets - 10 reps - Seated Ankle Circles  - 2 x daily - 7 x weekly - 2 sets - 10 reps - Seated Ankle Eversion with Resistance  - 1 x daily - 3-4 x weekly - 1 sets - 10 reps - Seated Ankle Dorsiflexion with Resistance  - 1 x daily - 4 x weekly - 2 sets - 10 reps - Seated  Ankle Inversion with Anchored Resistance  - 1 x daily - 3-4 x weekly - 1 sets - 10 reps - Standing Heel Raises  - 1 x daily - 4 x weekly - 2 sets - 10 reps - 3 sec hold  Patient Education - Ionto Pt Instructions - [facility]  ASSESSMENT:  CLINICAL IMPRESSION: Kaijah has made great progress since last visit coming out of boot.  She responded very well to manual techniques last visit.  We repeated this today and added dry needling to gastroc.  She had deep ache and one twitch response during needling which gave her am improved heel to toe gait upon standing and walking at end of session.  She has been out of the boot since last visit and plans on discontinuing the boot completely.  She has one visit left.  We will likely DC at that time.    OBJECTIVE IMPAIRMENTS: Abnormal gait, decreased balance, decreased endurance, decreased knowledge of use of DME, difficulty walking, decreased ROM, decreased strength, hypomobility, increased fascial restrictions, increased muscle spasms, impaired flexibility, obesity, and pain.   ACTIVITY LIMITATIONS: carrying, lifting, bending, standing, squatting, sleeping, stairs, transfers, and dressing  PARTICIPATION LIMITATIONS: meal prep, cleaning, laundry, shopping, community activity, yard work, and church  PERSONAL FACTORS: Fitness, Past/current experiences, and 3+ comorbidities: MS, OA, and obesity  are also affecting  patient's functional outcome.   REHAB POTENTIAL: Fair due to MS and obesity, treatment may be limited  CLINICAL DECISION MAKING: Evolving/moderate complexity  EVALUATION COMPLEXITY: Moderate   GOALS: Goals reviewed with patient? Yes  SHORT TERM GOALS: Target date: 05/24/2023  Pain report to be no greater than 4/10  Baseline: Goal status: MET 05/25/23  2.  Patient will be independent with initial HEP  Baseline:  Goal status: MET  3.  Patient to be able to walk with boot without cane safely Baseline:  Goal status: MET   LONG TERM GOALS:  Target date: 06/21/2023   Patient to report pain no greater than 2/10  Baseline:  Goal status: MET 06/15/23  2.  Patient to be independent with advanced HEP  Baseline:  Goal status: MET 06/15/23  3.  Functional scores to improve by 2-3 seconds Baseline:  Goal status: INITIAL  4.  LEFS score to improve to 50-54 Baseline:  Goal status: INITIAL  5.  Patient to be able to ambulate with normal heel to toe progression without boot with minimal pain no greater than 1/10 Baseline:  Goal status: MET 06/15/23  6.  Patient to be able to resume prior exercise routine Baseline:  Goal status: INITIAL   PLAN:  PT FREQUENCY: 2x/week  PT DURATION: 8 weeks  PLANNED INTERVENTIONS: Therapeutic exercises, Therapeutic activity, Neuromuscular re-education, Balance training, Gait training, Patient/Family education, Self Care, Joint mobilization, Stair training, Orthotic/Fit training, DME instructions, Aquatic Therapy, Dry Needling, Electrical stimulation, Cryotherapy, Splintting, Taping, Ultrasound, Ionotophoresis 4mg /ml Dexamethasone, Manual therapy, and Re-evaluation  PLAN FOR NEXT SESSION:  DC next visit.     Victorino Dike B. , PT 06/15/23 3:29 PM [facility] [address] [place], [place] [address] Phone # [phone] Fax [phone]

## 2023-06-19 ENCOUNTER — Encounter (INDEPENDENT_AMBULATORY_CARE_PROVIDER_SITE_OTHER): Payer: Self-pay | Admitting: Family Medicine

## 2023-06-19 ENCOUNTER — Ambulatory Visit (INDEPENDENT_AMBULATORY_CARE_PROVIDER_SITE_OTHER): Payer: 59 | Admitting: Family Medicine

## 2023-06-19 VITALS — BP 136/74 | HR 66 | Temp 97.7°F | Ht 67.0 in | Wt 308.0 lb

## 2023-06-19 DIAGNOSIS — E559 Vitamin D deficiency, unspecified: Secondary | ICD-10-CM

## 2023-06-19 DIAGNOSIS — E7849 Other hyperlipidemia: Secondary | ICD-10-CM

## 2023-06-19 DIAGNOSIS — Z6841 Body Mass Index (BMI) 40.0 and over, adult: Secondary | ICD-10-CM | POA: Diagnosis not present

## 2023-06-19 DIAGNOSIS — E669 Obesity, unspecified: Secondary | ICD-10-CM | POA: Diagnosis not present

## 2023-06-19 MED ORDER — VITAMIN D (ERGOCALCIFEROL) 1.25 MG (50000 UNIT) PO CAPS: 50000.0000 [IU] | ORAL_CAPSULE | ORAL | 0 refills | Status: DC

## 2023-06-19 MED ORDER — ATORVASTATIN CALCIUM 10 MG PO TABS: 10.0000 mg | ORAL_TABLET | Freq: Every day | ORAL | 0 refills | Status: DC

## 2023-06-19 NOTE — Progress Notes (Signed)
Chief Complaint:   OBESITY Meredith Wright is here to discuss her progress with her obesity treatment plan along with follow-up of her obesity related diagnoses. Meredith Wright is on keeping a food journal and adhering to recommended goals of 1650-1800 calories and 125+ grams of protein and states she is following her eating plan approximately 50% of the time. Meredith Wright states she is doing bed exercises and physical therapy for 20-25 minutes 7 times per week.  Today's visit was #: 11 Starting weight: 308 lbs Starting date: 11/30/2022 Today's weight: 308 lbs Today's date: 06/19/2023 Total lbs lost to date: 0 Total lbs lost since last in-office visit: 1  Interim History: Patient brings food log in today- she averaging around 1700 calories daily and is still somewhat short on protein (around 95g a day).  Upon review she is eating out  frequently- she is logging consistently and honestly but often using a significant amount of calories (around 400 on carbohydrates such as bread or potatoes).  She is making nutritious choices but is short on total protein.  Subjective:   1. Other hyperlipidemia Patient is on Lipitor daily, and she denies myalgias or transaminitis.  2. Vitamin D deficiency Patient is on prescription vitamin D.  She denies nausea, vomiting, or muscle weakness but notes fatigue.  Her last vitamin D level was very low in January 2024.  Assessment/Plan:   1. Other hyperlipidemia Patient will continue Lipitor 10 mg once daily, and we will refill for 1 month.  - atorvastatin (LIPITOR) 10 MG tablet; Take 1 tablet (10 mg total) by mouth daily.  Dispense: 30 tablet; Refill: 0  2. Vitamin D deficiency Patient will continue prescription vitamin D once weekly, and we will refill for 1 month.  - Vitamin D, Ergocalciferol, (DRISDOL) 1.25 MG (50000 UNIT) CAPS capsule; Take 1 capsule (50,000 Units total) by mouth every 7 (seven) days.  Dispense: 4 capsule; Refill: 0  3. BMI 45.0-49.9, adult (HCC)  4.  Obesity with starting BMI of 49.2 Meredith Wright is currently in the action stage of change. As such, her goal is to continue with weight loss efforts. She has agreed to keeping a food journal and adhering to recommended goals of 1650-1800 calories and 125+ grams of protein daily.   Exercise goals: All adults should avoid inactivity. Some physical activity is better than none, and adults who participate in any amount of physical activity gain some health benefits.  Behavioral modification strategies: increasing lean protein intake, meal planning and cooking strategies, keeping healthy foods in the home, and planning for success.  Meredith Wright has agreed to follow-up with our clinic in 3 to 4 weeks. She was informed of the importance of frequent follow-up visits to maximize her success with intensive lifestyle modifications for her multiple health conditions.   Objective:   Blood pressure 136/74, pulse 66, temperature 97.7 F (36.5 C), height 5\' 7"  (1.702 m), weight (!) 308 lb (139.7 kg), SpO2 98%. Body mass index is 48.24 kg/m.  General: Cooperative, alert, well developed, in no acute distress. HEENT: Conjunctivae and lids unremarkable. Cardiovascular: Regular rhythm.  Lungs: Normal work of breathing. Neurologic: No focal deficits.   Lab Results  Component Value Date   CREATININE 0.81 11/30/2022   BUN 17 11/30/2022   NA 142 11/30/2022   K 4.0 11/30/2022   CL 103 11/30/2022   CO2 21 11/30/2022   Lab Results  Component Value Date   ALT 22 11/30/2022   AST 16 11/30/2022   ALKPHOS 142 (H) 11/30/2022  BILITOT 0.4 11/30/2022   Lab Results  Component Value Date   HGBA1C 6.1 (H) 11/30/2022   Lab Results  Component Value Date   INSULIN 13.7 11/30/2022   Lab Results  Component Value Date   TSH 1.520 11/30/2022   Lab Results  Component Value Date   CHOL 243 (H) 11/30/2022   HDL 42 11/30/2022   LDLCALC 157 (H) 11/30/2022   TRIG 236 (H) 11/30/2022   CHOLHDL 5.4 (H) 06/13/2022   Lab  Results  Component Value Date   VD25OH 11.7 (L) 11/30/2022   Lab Results  Component Value Date   WBC 12.4 (H) 11/30/2022   HGB 13.3 11/30/2022   HCT 40.3 11/30/2022   MCV 83 11/30/2022   PLT 364 11/30/2022   Lab Results  Component Value Date   IRON 43 06/06/2013   TIBC 328 06/06/2013   Attestation Statements:   Reviewed by clinician on day of visit: allergies, medications, problem list, medical history, surgical history, family history, social history, and previous encounter notes.   I, Burt Knack, am acting as transcriptionist for Reuben Likes, MD.  I have reviewed the above documentation for accuracy and completeness, and I agree with the above. - Reuben Likes, MD

## 2023-06-20 ENCOUNTER — Ambulatory Visit: Payer: 59

## 2023-06-20 DIAGNOSIS — R296 Repeated falls: Secondary | ICD-10-CM

## 2023-06-20 DIAGNOSIS — M25572 Pain in left ankle and joints of left foot: Secondary | ICD-10-CM

## 2023-06-20 DIAGNOSIS — G8929 Other chronic pain: Secondary | ICD-10-CM

## 2023-06-20 DIAGNOSIS — M6281 Muscle weakness (generalized): Secondary | ICD-10-CM

## 2023-06-20 DIAGNOSIS — R262 Difficulty in walking, not elsewhere classified: Secondary | ICD-10-CM

## 2023-06-20 DIAGNOSIS — R252 Cramp and spasm: Secondary | ICD-10-CM

## 2023-06-20 NOTE — Therapy (Signed)
OUTPATIENT PHYSICAL THERAPY LOWER EXTREMITYTREATMENT PHYSICAL THERAPY DISCHARGE SUMMARY  Visits from Start of Care: 15  Current functional level related to goals / functional outcomes: See below   Remaining deficits: See below   Education / Equipment: See below   Patient agrees to discharge. Patient goals were met. Patient is being discharged due to meeting the stated rehab goals.    Patient Name: Meredith Wright MRN: [id number] DOB:[birth year], 52 y.o., female Today's Date: 06/20/2023     END OF SESSION:  PT End of Session - 06/20/23 1405     Visit Number 14    Date for PT Re-Evaluation 06/21/23    Authorization Type [employer]IC[employer]    PT Start Time 1400    PT Stop Time 1446    PT Time Calculation (min) 46 min    Activity Tolerance Patient tolerated treatment well;Other (comment)    Behavior During Therapy WFL for tasks assessed/performed                Past Medical History:  Diagnosis Date   Arthritis    Joint pain    Multiple food allergies    Multiple sclerosis (HCC)    Sickle cell anemia (HCC)    trait   Past Surgical History:  Procedure Laterality Date   BRAIN SURGERY  11/07/1996   Biopsy -Dx MS   CESAREAN SECTION     Patient Active Problem List   Diagnosis Date Noted   Achilles tendinitis of left lower extremity 04/04/2023   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   Prediabetes 03/02/2023   Vitamin D deficiency 03/02/2023   Dietary counseling and surveillance 06/08/2022   Hypopigmentation 06/08/2022   Severe obesity (BMI >= 40) (HCC) 05/25/2022   Food allergy 05/04/2022   Lower extremity edema 03/22/2022   Bilateral hand pain 12/27/2021   Trigger thumb of left hand 12/27/2021   Essential hypertension 04/27/2021   Osteoarthritis of left knee 02/25/2021   Knee pain, bilateral 01/15/2021   BMI 40.0-44.9, adult (HCC) 01/15/2021   Sickle cell trait (HCC) 04/08/2016   DUB (dysfunctional uterine bleeding) 04/01/2014   Multiple sclerosis  (HCC)     PCP: de [place], Raymond J, MD   REFERRING PROVIDER: de [place], Raymond J, MD  REFERRING DIAG: [phone] (ICD-10-CM) - Achilles tendinitis of left lower extremity  THERAPY DIAG:  Pain in left ankle and joints of left foot  Difficulty in walking, not elsewhere classified  Muscle weakness (generalized)  Repeated falls  Chronic pain of left knee  Cramp and spasm  Rationale for Evaluation and Treatment: Rehabilitation  ONSET DATE: 04/04/2023  SUBJECTIVE:   SUBJECTIVE STATEMENT: Patient reports "a little bit of pain in that one spot"  "Maybe a 2/10"   Has not worn boot at all.  Doing well.     Eval:Patient reports long history of MS and OA.  I was diagnosed with MS in my 20's.  I am disabled.  I like to work out at home but MD said not to workout due to my ankle.  Pain in left achilles for several months.  She is currently in a walking boot and in night splint at night.  She has pain at the distal achilles at the attachment site.  She hopes to be able to resume exercise and walk without pain.   PERTINENT HISTORY: MS and OA  PAIN:  Are you having pain?  2/10   Achilles area  PRECAUTIONS: Other: MS  WEIGHT BEARING RESTRICTIONS: No  FALLS:  Has patient fallen in  last 6 months? Yes. Number of falls March, collapsed from being overheated.  She states overheating with MS patients cause [place] to collapse.   LIVING ENVIRONMENT: Lives with: lives with their spouse Lives in: House/apartment Stairs: Yes: Internal: 3 steps; on right going up and External: 3 steps; on right going up Has following equipment at home: Quad cane small base  OCCUPATION: Disabled  PLOF: Independent, Independent with household mobility with device, Independent with community mobility with device, Independent with homemaking with ambulation, Independent with gait, and Independent with transfers  PATIENT GOALS: to be able to walk without pain and workout  NEXT MD VISIT: Aug 5, (sees DPM on Aug  1)  OBJECTIVE:   DIAGNOSTIC FINDINGS: Spur left achilles  PATIENT SURVEYS:  LEFS 44/80 7/30: 43/80 8/13: 62/80  COGNITION: Overall cognitive status: Within functional limits for tasks assessed     SENSATION: WFL   POSTURE:  pes planus and valgus knee  PALPATION: Tender at distal achilles left 05/02/23 tender along lateral malleolus ligaments and peroneal tendons; no pain with MMT 06/20/23: tender at distal achilles, no longer tender along peroneals  LOWER EXTREMITY ROM:  Active ROM Right eval Right 06/20/23 Left eval Left 7/30 Left 06/20/23  Hip flexion       Hip extension       Hip abduction       Hip adduction       Hip internal rotation       Hip external rotation       Knee flexion       Knee extension       Ankle dorsiflexion  8 10 10 10   Ankle plantarflexion  60 48 68 62  Ankle inversion  32 28 30 30   Ankle eversion  24 12 15 28    (Blank rows = not tested)  LOWER EXTREMITY MMT:  MMT Right eval Left 05/02/23 Left 7/30 Left 06/20/23  Hip flexion      Hip extension      Hip abduction      Hip adduction      Hip internal rotation      Hip external rotation      Knee flexion      Knee extension      Ankle dorsiflexion  4+ 4+ 5  Ankle plantarflexion    5  Ankle inversion  4+ 4 5  Ankle eversion  4 4- painful 5   (Blank rows = not tested)   FUNCTIONAL TESTS:  5 times sit to stand: 19.41 sec Timed up and go (TUG): 14.32 7/30:  5 times sit to stand:  21.36 no hands  06/20/23: 5 times sit to stand: 11.25 sec Timed up and go (TUG): 8.04 sec               GAIT: Distance walked: 30 Assistive device utilized: Quad cane small base Level of assistance: Modified independence Comments: antalgic   TODAY'S TREATMENT:  DATE:  06/20/23: Nustep x 5 min level 5 DC assessment completed Review of HEP DC plan provided with updated  HEP  DATE:  06/15/23: Nustep x 5 min level 5 Seated toe and heel raises x 20 each Manual gastroc solues stretching in prone, STM, IASTM, Cupping to entire gastroc/soleus and achilles area.  Trigger Point Dry-Needling  Treatment instructions: Expect mild to moderate muscle soreness. S/S of pneumothorax if dry needled over a lung field, and to seek immediate medical attention should they occur. Patient verbalized understanding of these instructions and education. Patient Consent Given: Yes Education handout provided: Yes Muscles treated: left gastroc Electrical stimulation performed: No Parameters: N/A Treatment response/outcome: Skilled palpation used to identify taut bands and trigger points.  Once identified, dry needling techniques used to treat these areas.  Twitch response ellicited along with palpable elongation of muscle.  Following treatment, patient reports mild soreness but demonstrates minimally antalgic gait.    06/13/23 Rocker board x 2 min Gastroc stretch on rocker board x 10 holding 10 sec Soleus stretch on rocker board x 10 holding 10 sec Lengthy discussion about how to wean out of boot and possible orthotic with small heel lift.  Manual gastroc solues stretching in prone, STM, IASTM, Cupping to entire gastroc/soleus and achilles area.   06/08/23 Nustep L1 x 9 min while discussing status WB on left with right touches to circles random combos (light touch on railing) Staggered stance heel raises x 5 each way Box formation heel raises: shift right, up, shift left and lower 5x each direction Discussed HEP red band ex sheet provided by [facility]  Standing floor slider to work on toe extension 10x Standing "short foot" arch lift 10x    PATIENT EDUCATION:  Education details: updated HEP Person educated: Patient Education method: Programmer, multimedia, Facilities manager, Verbal cues, and Handouts Education comprehension: verbalized understanding, returned demonstration, and  verbal cues required  HOME EXERCISE PROGRAM: Access Code: [email or web address] URL: [email or web address] Date: 06/20/2023 Prepared by: Mikey Kirschner  Exercises - Seated Ankle Alphabet  - 1 x daily - 7 x weekly - 1 sets - 1 reps - a-z hold - Seated Heel Raise  - 1 x daily - 7 x weekly - 1 sets - 20 reps - Seated Toe Raise  - 1 x daily - 7 x weekly - 3 sets - 10 reps - Gastroc Stretch on Wall  - 1 x daily - 7 x weekly - 3 sets - 10 reps - Soleus Stretch on Wall  - 1 x daily - 7 x weekly - 3 sets - 10 reps - Seated Ankle Circles  - 2 x daily - 7 x weekly - 2 sets - 10 reps - Seated Ankle Eversion with Resistance  - 1 x daily - 3-4 x weekly - 1 sets - 10 reps - Seated Ankle Dorsiflexion with Resistance  - 1 x daily - 4 x weekly - 2 sets - 10 reps - Seated Ankle Inversion with Anchored Resistance  - 1 x daily - 3-4 x weekly - 1 sets - 10 reps - Standing Heel Raises  - 1 x daily - 4 x weekly - 2 sets - 10 reps - 3 sec hold - Side Stepping with Resistance at Ankles  - 1 x daily - 7 x weekly - 3 sets - 10 reps - Single Leg Stance with Support  - 1 x daily - 7 x weekly - 1 sets - 10 reps - Single Leg Cone Touch  - 1  x daily - 7 x weekly - 3 sets - 10 reps  Patient Education - [place] Pt Instructions - [facility] ASSESSMENT:  CLINICAL IMPRESSION: Shevy has met all goals.  Her objective findings are much improved and symmetrical to uninvolved.  She has fair amount of restriction in the right ankle as well.  Therefore we have encouraged her to do her HEP on both LE's.  She is being Dc'd at this time with all goals met.      OBJECTIVE IMPAIRMENTS: Abnormal gait, decreased balance, decreased endurance, decreased knowledge of use of DME, difficulty walking, decreased ROM, decreased strength, hypomobility, increased fascial restrictions, increased muscle spasms, impaired flexibility, obesity, and pain.   ACTIVITY LIMITATIONS: carrying, lifting, bending, standing, squatting, sleeping, stairs,  transfers, and dressing  PARTICIPATION LIMITATIONS: meal prep, cleaning, laundry, shopping, community activity, yard work, and church  PERSONAL FACTORS: Fitness, Past/current experiences, and 3+ comorbidities: MS, OA, and obesity  are also affecting patient's functional outcome.   REHAB POTENTIAL: Fair due to MS and obesity, treatment may be limited  CLINICAL DECISION MAKING: Evolving/moderate complexity  EVALUATION COMPLEXITY: Moderate   GOALS: Goals reviewed with patient? Yes  SHORT TERM GOALS: Target date: 05/24/2023  Pain report to be no greater than 4/10  Baseline: Goal status: MET 05/25/23  2.  Patient will be independent with initial HEP  Baseline:  Goal status: MET  3.  Patient to be able to walk with boot without cane safely Baseline:  Goal status: MET   LONG TERM GOALS: Target date: 06/21/2023   Patient to report pain no greater than 2/10  Baseline:  Goal status: MET 06/15/23  2.  Patient to be independent with advanced HEP  Baseline:  Goal status: MET 06/15/23  3.  Functional scores to improve by 2-3 seconds Baseline:  Goal status: MET 06/20/23  4.  LEFS score to improve to 50-54 Baseline:  Goal status: MET 06/20/23  5.  Patient to be able to ambulate with normal heel to toe progression without boot with minimal pain no greater than 1/10 Baseline:  Goal status: MET 06/15/23  6.  Patient to be able to resume prior exercise routine Baseline:  Goal status: MET 06/20/23   PLAN:  PT FREQUENCY: 2x/week  PT DURATION: 8 weeks  PLANNED INTERVENTIONS: Therapeutic exercises, Therapeutic activity, Neuromuscular re-education, Balance training, Gait training, Patient/Family education, Self Care, Joint mobilization, Stair training, Orthotic/Fit training, DME instructions, Aquatic Therapy, Dry Needling, Electrical stimulation, Cryotherapy, Splintting, Taping, Ultrasound, Ionotophoresis 4mg /ml Dexamethasone, Manual therapy, and Re-evaluation  PLAN FOR NEXT SESSION:   DC    B. , PT 06/20/23 2:58 PM [facility] [address], [address] [place], [place] [address] Phone # [phone] Fax [phone]

## 2023-07-06 ENCOUNTER — Encounter: Payer: Self-pay | Admitting: Podiatry

## 2023-07-06 ENCOUNTER — Ambulatory Visit (INDEPENDENT_AMBULATORY_CARE_PROVIDER_SITE_OTHER): Payer: 59 | Admitting: Podiatry

## 2023-07-06 DIAGNOSIS — M7662 Achilles tendinitis, left leg: Secondary | ICD-10-CM | POA: Diagnosis not present

## 2023-07-06 NOTE — Progress Notes (Signed)
Follow-up today of her Achilles tendinitis she states that she was 100% improved no no tenderness on palpation.  She states that she is doing well walking with her new tennis shoes she has [place].  New balance.  Objective: Vital signs are stable alert oriented x 3.  Pulses are palpable.  Neurologic sensorium is intact per Semmes Weinstein monofilament.  She has no tenderness on palpation of the Achilles at this point.  She has good plantarflexion against resistance.  Assessment: Well-healing tendinitis.  Plan: Follow-up with me on an as-needed basis.  Get back to regular routine.

## 2023-07-10 ENCOUNTER — Other Ambulatory Visit (INDEPENDENT_AMBULATORY_CARE_PROVIDER_SITE_OTHER): Payer: Self-pay | Admitting: Family Medicine

## 2023-07-10 DIAGNOSIS — E559 Vitamin D deficiency, unspecified: Secondary | ICD-10-CM

## 2023-07-24 ENCOUNTER — Encounter (INDEPENDENT_AMBULATORY_CARE_PROVIDER_SITE_OTHER): Payer: Self-pay | Admitting: Physician Assistant

## 2023-07-24 ENCOUNTER — Ambulatory Visit (INDEPENDENT_AMBULATORY_CARE_PROVIDER_SITE_OTHER): Payer: 59 | Admitting: Physician Assistant

## 2023-07-24 VITALS — BP 131/75 | HR 76 | Temp 97.9°F | Ht 67.0 in | Wt 312.0 lb

## 2023-07-24 DIAGNOSIS — R7303 Prediabetes: Secondary | ICD-10-CM

## 2023-07-24 DIAGNOSIS — E7849 Other hyperlipidemia: Secondary | ICD-10-CM | POA: Diagnosis not present

## 2023-07-24 DIAGNOSIS — E559 Vitamin D deficiency, unspecified: Secondary | ICD-10-CM | POA: Diagnosis not present

## 2023-07-24 DIAGNOSIS — E669 Obesity, unspecified: Secondary | ICD-10-CM

## 2023-07-24 DIAGNOSIS — E785 Hyperlipidemia, unspecified: Secondary | ICD-10-CM | POA: Insufficient documentation

## 2023-07-24 DIAGNOSIS — Z6841 Body Mass Index (BMI) 40.0 and over, adult: Secondary | ICD-10-CM

## 2023-07-24 DIAGNOSIS — Z0289 Encounter for other administrative examinations: Secondary | ICD-10-CM

## 2023-07-24 DIAGNOSIS — I10 Essential (primary) hypertension: Secondary | ICD-10-CM | POA: Insufficient documentation

## 2023-07-24 MED ORDER — ATORVASTATIN CALCIUM 10 MG PO TABS
10.0000 mg | ORAL_TABLET | Freq: Every day | ORAL | 0 refills | Status: DC
Start: 2023-07-24 — End: 2023-09-12

## 2023-07-24 MED ORDER — AMLODIPINE BESYLATE 5 MG PO TABS: 5.0000 mg | ORAL_TABLET | Freq: Every day | ORAL | 1 refills | Status: DC

## 2023-07-24 MED ORDER — VITAMIN D (ERGOCALCIFEROL) 1.25 MG (50000 UNIT) PO CAPS: 50000.0000 [IU] | ORAL_CAPSULE | ORAL | 0 refills | Status: DC

## 2023-07-24 NOTE — Progress Notes (Signed)
.sm[facility]  Office: [phone]  /  Fax: [phone]  WEIGHT SUMMARY AND BIOMETRICS  Vitals Temp: 97.9 F (36.6 C) BP: 131/75 Pulse Rate: 76 SpO2: 96 %   Anthropometric Measurements Height: 5\' 7"  (1.702 m) Weight: (!) 312 lb (141.5 kg) BMI (Calculated): 48.85 Weight at Last Visit: 308 lb Weight Lost Since Last Visit: 0 Weight Gained Since Last Visit: 4 lb Starting Weight: 308 lb Total Weight Loss (lbs): 0 lb (0 kg) Peak Weight: 350 lb   Body Composition  Body Fat %: 53.2 % Fat Mass (lbs): 166.4 lbs Muscle Mass (lbs): 139 lbs Total Body Water (lbs): 112.2 lbs Visceral Fat Rating : 19   Other Clinical Data Fasting: no Labs: no Today's Visit #: 13 Starting Date: 11/30/22     HPI  Chief Complaint: OBESITY  Meredith Wright is here to discuss her progress with her obesity treatment plan. She is on the keeping a food journal and adhering to recommended goals of 1650-1800 calories and 100 grams of protein and states she is following her eating plan approximately 100 % of the time. She states she is exercising 0 minutes 0 times per week.  Discussed the use of AI scribe software for clinical note transcription with the patient, who gave verbal consent to proceed.  History of Present Illness  / Interval History:  Since last office visit she Up 4 lbs.  Bio impedence scale reviewed with the patient:    Meredith Wright, a 52 year old female with a history of obesity, hypertension, hyperlipidemia, vitamin D deficiency, prediabetes, and multiple sclerosis (MS), presents for a follow-up visit for her obesity treatment plan. The patient reports overeating, particularly when bored, as a significant challenge. Despite this, the patient has been adhering to chair exercises and has noticed an increase in muscle mass. However, the patient also reports a slight increase in adipose tissue. The patient's MS has affected her mobility and exercise routine, which has been a source of frustration for her. The  patient expresses interest in finding other activities to keep her occupied and prevent overeating. She is also interested in chair yoga and other forms of exercise that are safe for her to do with her MS.  Pharmacotherapy: Unable to get semaglutide due to no insurance coverage.   TREATMENT PLAN FOR OBESITY:  Recommended Dietary Goals  Meredith Wright is currently in the action stage of change. As such, her goal is to continue weight management plan. She has agreed to keeping a food journal and adhering to recommended goals of 1650-1800 calories and 100 grams of protein.  Behavioral Intervention  We discussed the following Behavioral Modification Strategies today: increasing lean protein intake, decreasing simple carbohydrates , increasing vegetables, increasing lower glycemic fruits, increasing fiber rich foods, avoiding skipping meals, increasing water intake, work on tracking and journaling calories using tracking application, emotional eating strategies and understanding the difference between hunger signals and cravings, avoiding temptations and identifying enticing environmental cues, continue to practice mindfulness when eating, and planning for success.  Additional resources provided today: NA  Recommended Physical Activity Goals  Meredith Wright has been advised to work up to 150 minutes of moderate intensity aerobic activity a week and strengthening exercises 2-3 times per week for cardiovascular health, weight loss maintenance and preservation of muscle mass.   She has agreed to Continue current level of physical activity  and Consider Chair yoga classes here in the clinic and chair exercises on You tube to increase activity level.    Pharmacotherapy We discussed various medication options to help Meredith Wright  with her weight loss efforts and we both agreed to continue to work on nutritional and behavioral strategies to promote weight loss. We discussed referral to Dr. Dewaine Conger today as well for emotional eating  behaviors.    Return in about 4 weeks (around 08/21/2023).Marland Kitchen She was informed of the importance of frequent follow up visits to maximize her success with intensive lifestyle modifications for her multiple health conditions.  PHYSICAL EXAM:  Blood pressure 131/75, pulse 76, temperature 97.9 F (36.6 C), height 5\' 7"  (1.702 m), weight (!) 312 lb (141.5 kg), SpO2 96%. Body mass index is 48.87 kg/m.  General: She is overweight, cooperative, alert, well developed, and in no acute distress. PSYCH: Has normal mood, affect and thought process.   Cardiovascular: HR 70's , BP 131/75 Lungs: Normal breathing effort, no conversational dyspnea. Neuro: no focal deficits  DIAGNOSTIC DATA REVIEWED:  BMET    Component Value Date/Time   NA 142 11/30/2022 0929   K 4.0 11/30/2022 0929   CL 103 11/30/2022 0929   CO2 21 11/30/2022 0929   GLUCOSE 81 11/30/2022 0929   GLUCOSE 92 01/15/2021 1200   BUN 17 11/30/2022 0929   CREATININE 0.81 11/30/2022 0929   CREATININE 0.60 06/06/2013 0942   CALCIUM 9.3 11/30/2022 0929   GFRNONAA >60 01/15/2021 1200   GFRNONAA >89 06/06/2013 0942   GFRAA >60 12/20/2015 1814   GFRAA >89 06/06/2013 0942   Lab Results  Component Value Date   HGBA1C 6.1 (H) 11/30/2022   Lab Results  Component Value Date   INSULIN 13.7 11/30/2022   Lab Results  Component Value Date   TSH 1.520 11/30/2022   CBC    Component Value Date/Time   WBC 12.4 (H) 11/30/2022 0929   WBC 9.0 01/15/2021 1200   RBC 4.87 11/30/2022 0929   RBC 4.76 01/15/2021 1200   HGB 13.3 11/30/2022 0929   HCT 40.3 11/30/2022 0929   PLT 364 11/30/2022 0929   MCV 83 11/30/2022 0929   MCH 27.3 11/30/2022 0929   MCH 27.5 01/15/2021 1200   MCHC 33.0 11/30/2022 0929   MCHC 34.3 01/15/2021 1200   RDW 14.5 11/30/2022 0929   Iron Studies    Component Value Date/Time   IRON 43 06/06/2013 0942   TIBC 328 06/06/2013 0942   IRONPCTSAT 13 (L) 06/06/2013 0942   Lipid Panel     Component Value Date/Time    CHOL 243 (H) 11/30/2022 0929   TRIG 236 (H) 11/30/2022 0929   HDL 42 11/30/2022 0929   CHOLHDL 5.4 (H) 06/13/2022 0923   CHOLHDL 4.2 01/15/2021 1200   VLDL 32 01/15/2021 1200   LDLCALC 157 (H) 11/30/2022 0929   Hepatic Function Panel     Component Value Date/Time   PROT 7.4 11/30/2022 0929   ALBUMIN 4.3 11/30/2022 0929   AST 16 11/30/2022 0929   ALT 22 11/30/2022 0929   ALKPHOS 142 (H) 11/30/2022 0929   BILITOT 0.4 11/30/2022 0929      Component Value Date/Time   TSH 1.520 11/30/2022 0929   Nutritional Lab Results  Component Value Date   VD25OH 11.7 (L) 11/30/2022    ASSOCIATED CONDITIONS ADDRESSED TODAY  ASSESSMENT AND PLAN  Problem List Items Addressed This Visit     BMI 45.0-49.9, adult (HCC)   Prediabetes   Vitamin D deficiency   Relevant Medications   Vitamin D, Ergocalciferol, (DRISDOL) 1.25 MG (50000 UNIT) CAPS capsule   Primary hypertension - Primary   Relevant Medications   amLODipine (NORVASC) 5 MG tablet  atorvastatin (LIPITOR) 10 MG tablet   Other hyperlipidemia   Relevant Medications   amLODipine (NORVASC) 5 MG tablet   atorvastatin (LIPITOR) 10 MG tablet   Obesity with starting BMI of 49.2  Obesity Struggling with overeating and decreased physical activity due to recent injury and restrictions related to MS. Noted some muscle gain but also adipose gain. Discussed strategies to manage cravings and boredom eating. -Continue chair exercises to build muscle. -Consider attending chair yoga classes offered in the office. -Referral to Dr. Dewaine Conger, psychologist, for additional strategies to manage cravings and emotional eating.  Hypertension Well-controlled on current medication regimen. -Continue Amlodipine. valsartan. Continue to work on nutrition plan to promote weight loss and improve BP control.   Hyperlipidemia No reported issues with current medication- Lipitor 10 mg daily. -Continue Lipitor. Continue to work on nutrition plan -decreasing  simple carbohydrates, increasing lean proteins, decreasing saturated fats and cholesterol , avoiding trans fats and exercise as able to promote weight loss, improve lipids and decrease cardiovascular risks.  Vitamin D Deficiency Last level was low in January 2024, currently on supplementation. -Continue Vitamin D supplementation. -Check Vitamin D level at next lab appointment in November 2024.  Multiple Sclerosis Recent injury due to fall, currently experiencing limitations in physical activity. -Explore safe exercise options, including chair yoga and chair exercises on YouTube. -Consider walking with family members on Monday, Wednesday, and Friday if approved by MS doctor.  General Health Maintenance -Continue current medications, including Amlodipine, Lipitor, and Vitamin D. -Check Vitamin D level in November 2024. -Consider using [employer] milk and nutrition shakes as part of diet. -Referral to Dr. Dewaine Conger for psychological support and strategies for managing cravings and emotional eating. -Consider attending chair yoga classes and doing chair exercises at home. -Consider walking with family members on Monday, Wednesday, and Friday if approved by MS doctor.  ATTESTASTION STATEMENTS:  Reviewed by clinician on day of visit: allergies, medications, problem list, medical history, surgical history, family history, social history, and previous encounter notes.   I have personally spent 36 minutes total time today in preparation, patient care, nutritional counseling and documentation for this visit, including the following: review of clinical lab tests; review of medical tests/procedures/services.      Meredith Farinas, PA-C

## 2023-08-01 ENCOUNTER — Encounter (INDEPENDENT_AMBULATORY_CARE_PROVIDER_SITE_OTHER): Payer: 59

## 2023-08-08 ENCOUNTER — Encounter (INDEPENDENT_AMBULATORY_CARE_PROVIDER_SITE_OTHER): Payer: 59

## 2023-08-08 IMAGING — CR DG HAND COMPLETE 3+V*R*
3 series · 3 of 3 positions shown · non-contrast
Comparison: None.

CLINICAL DATA: Pain

EXAM:
RIGHT HAND - COMPLETE 3+ VIEW

[x hand pa right]
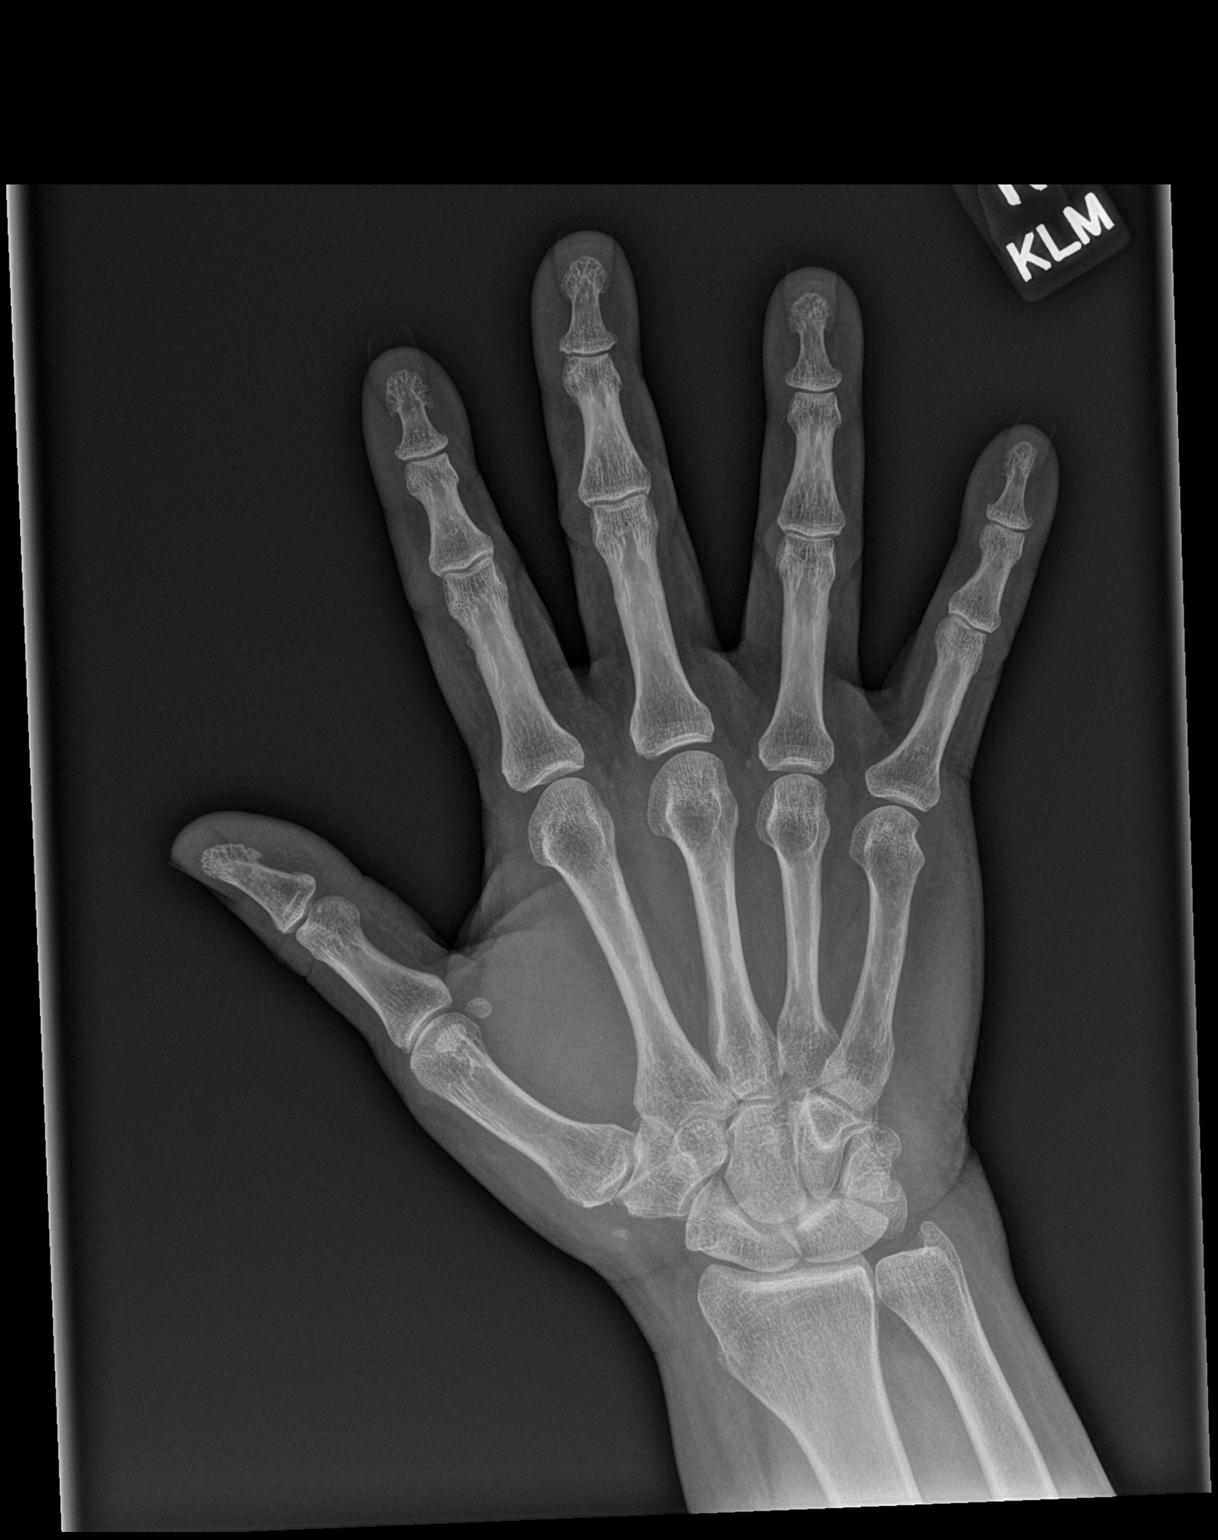

[x hand obl right]
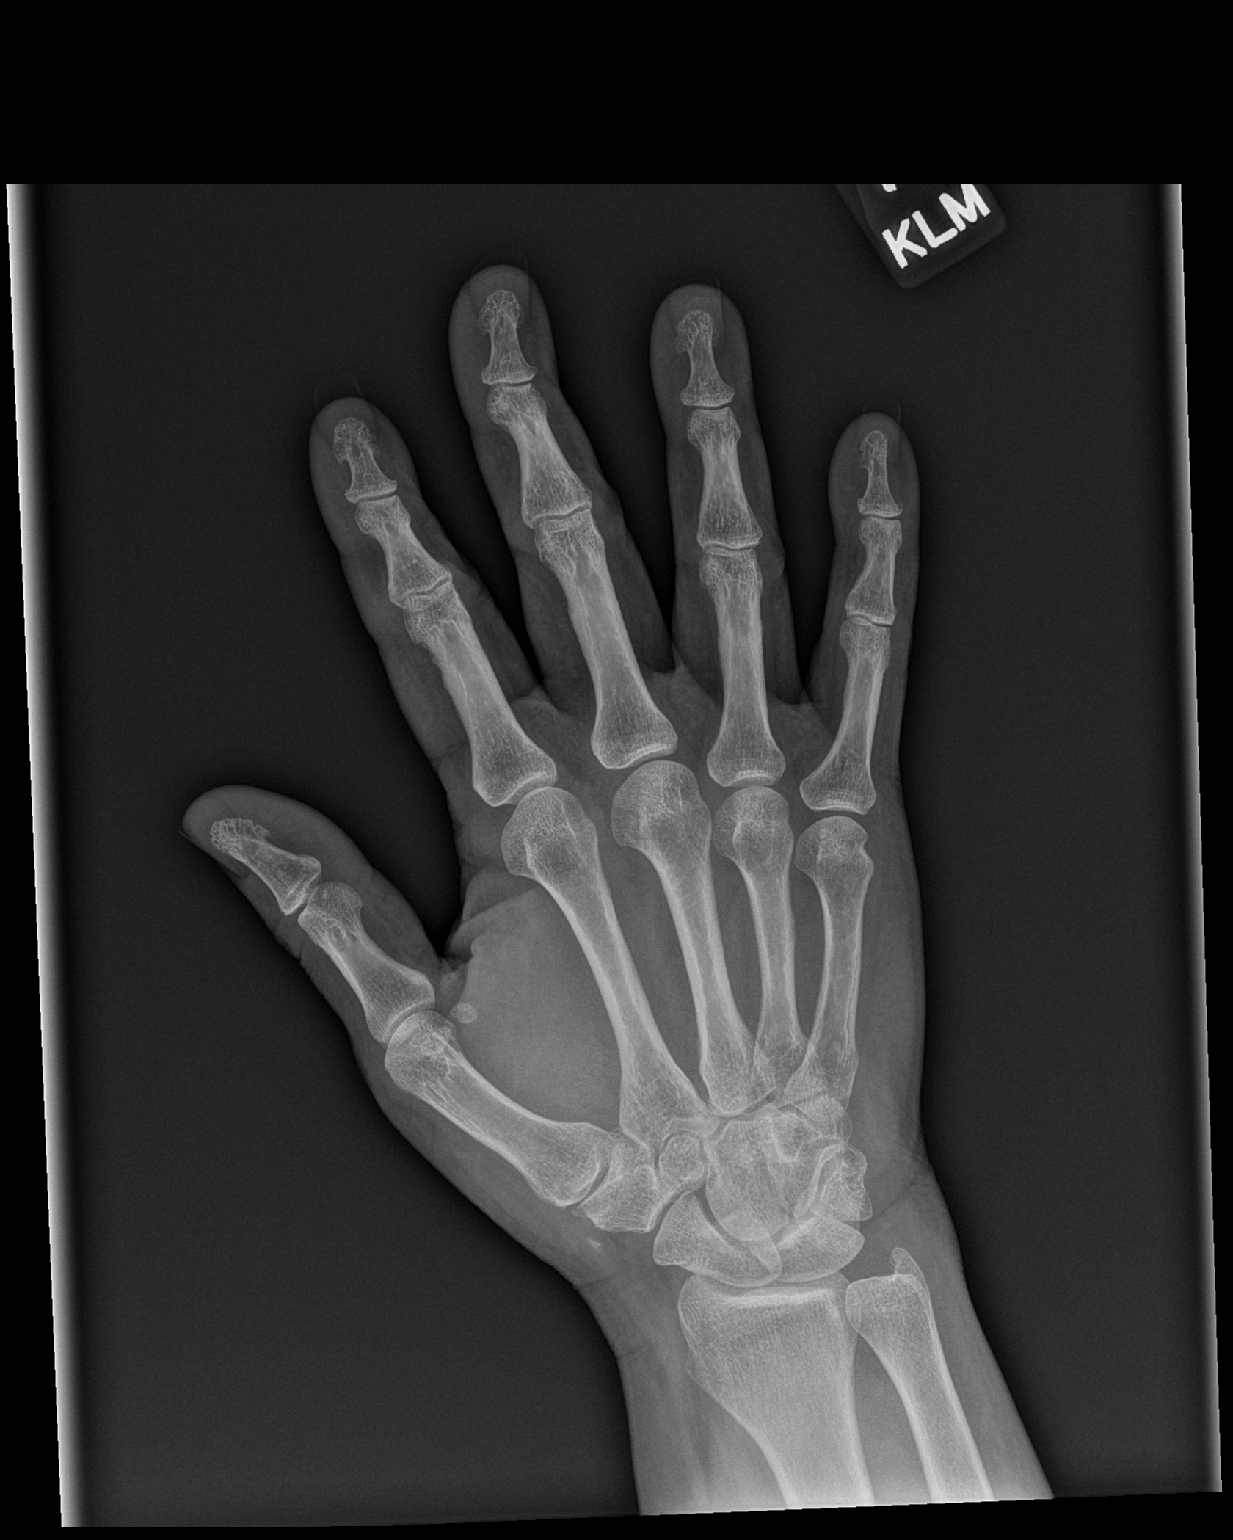

[x hand lat right]
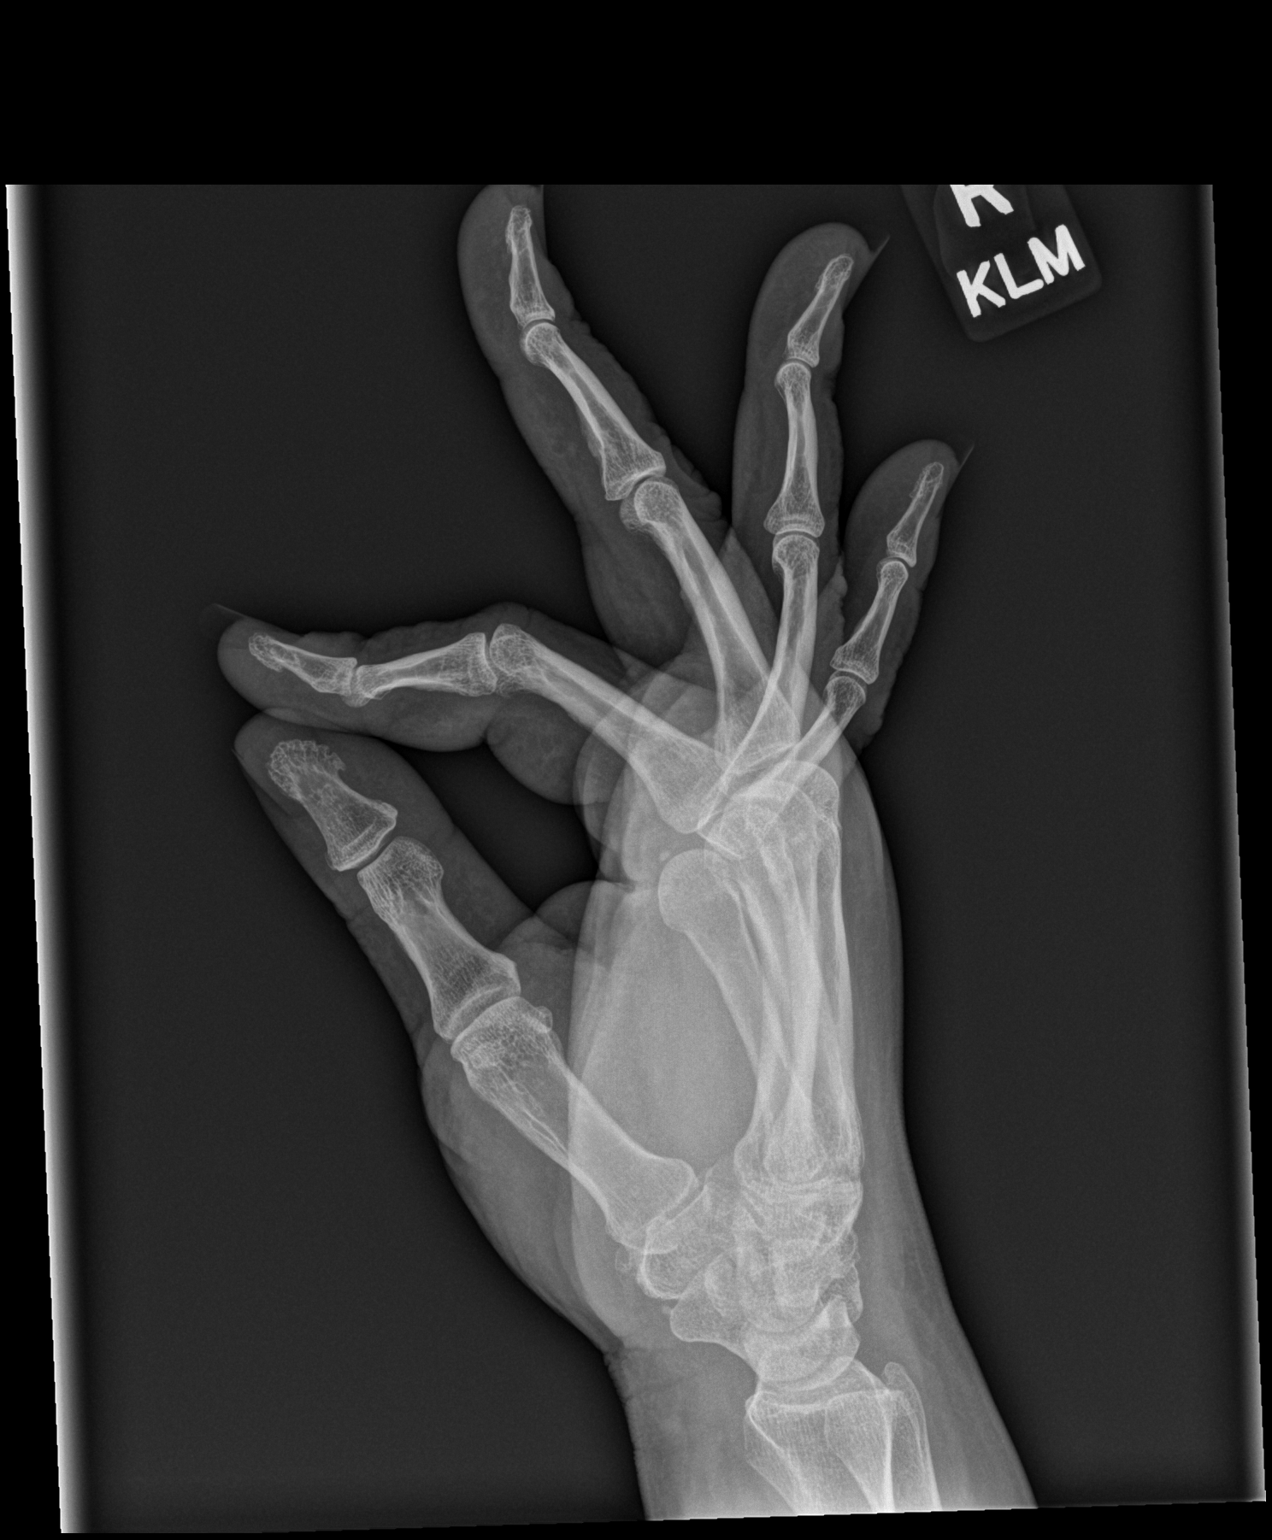

[3 of 3 positions shown; findings below may reference images not displayed]

FINDINGS: No recent fracture or dislocation is seen. Small bony spurs seen in
first carpometacarpal joint. There is 3 mm faint calcification
lateral to the distal row of carpals, possibly residual from
previous injury.
IMPRESSION: No fracture or dislocation is seen. Degenerative changes with small
bony spurs seen in first carpometacarpal joint.

## 2023-08-08 IMAGING — MG MM DIGITAL SCREENING BILAT W/ TOMO AND CAD
8 series · 8 of 24 positions shown · non-contrast
Comparison: Previous exam(s).

CLINICAL DATA: Screening.

EXAM:
DIGITAL SCREENING BILATERAL MAMMOGRAM WITH TOMOSYNTHESIS AND CAD
TECHNIQUE: Bilateral screening digital craniocaudal and mediolateral oblique
mammograms were obtained. Bilateral screening digital breast
tomosynthesis was performed. The images were evaluated with
computer-aided detection.

[R MLO synth-2D]
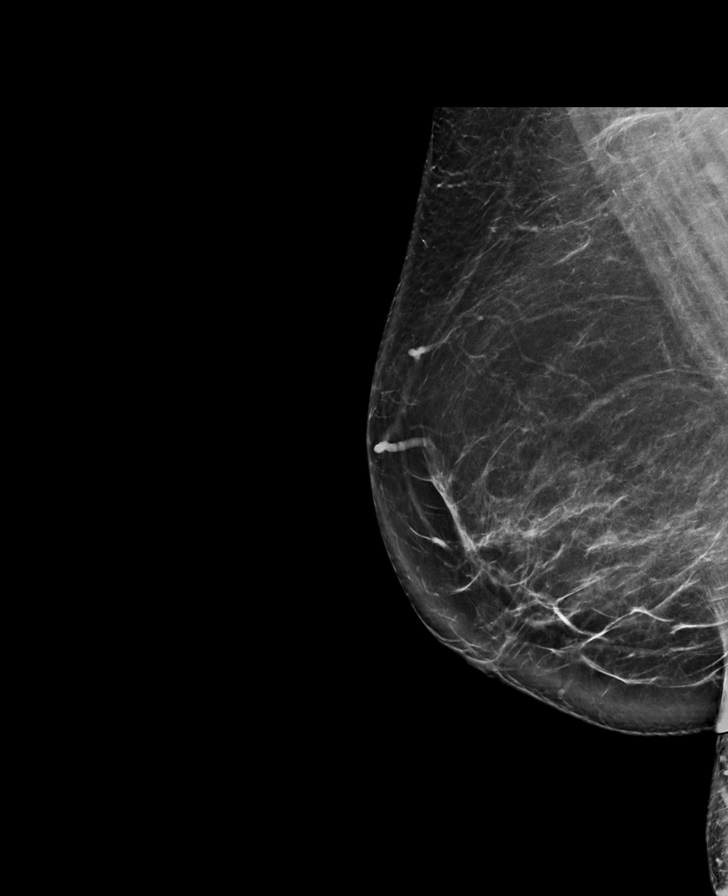

[L MLO synth-2D]
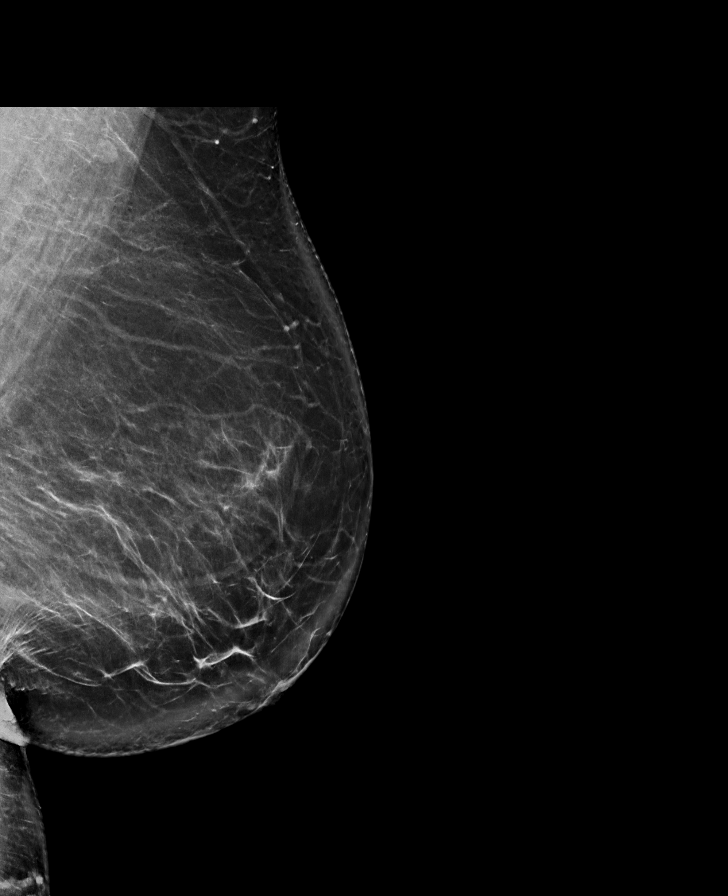

[L CC synth-2D]
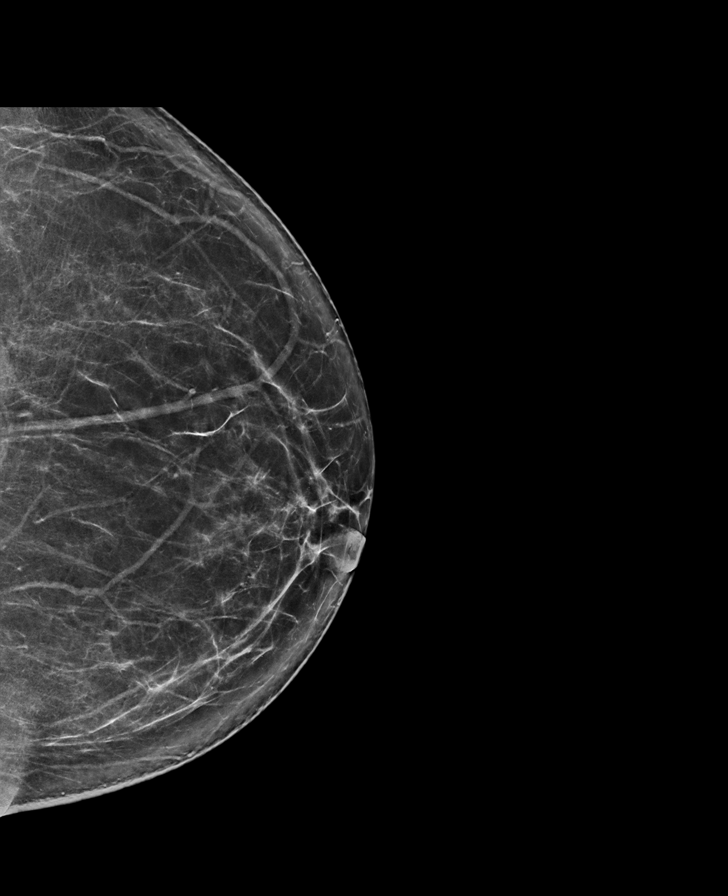

[R CC synth-2D]
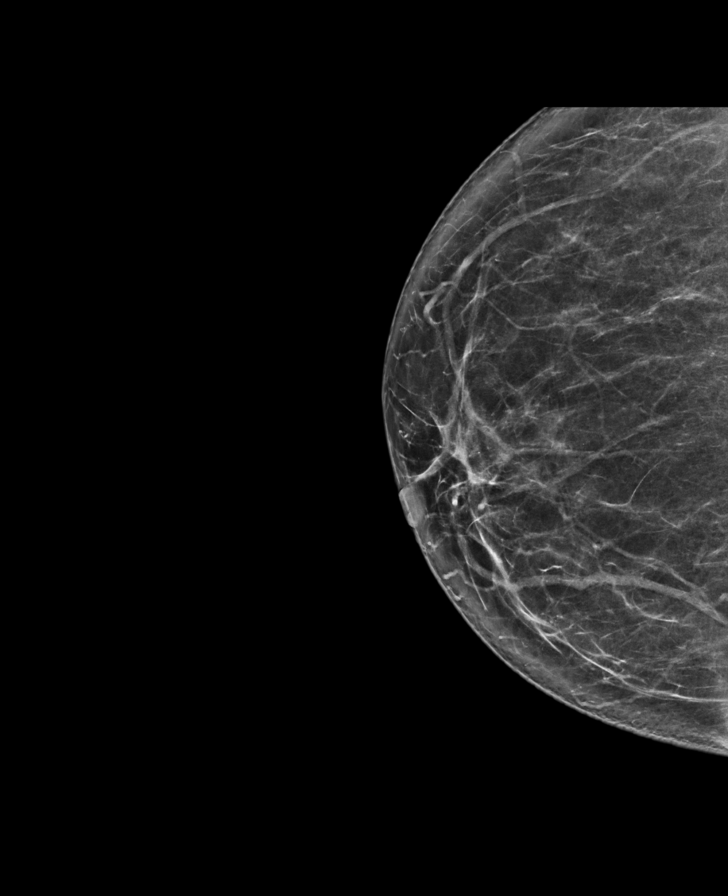

[R CC tomo · tomo slice 40/79.0]
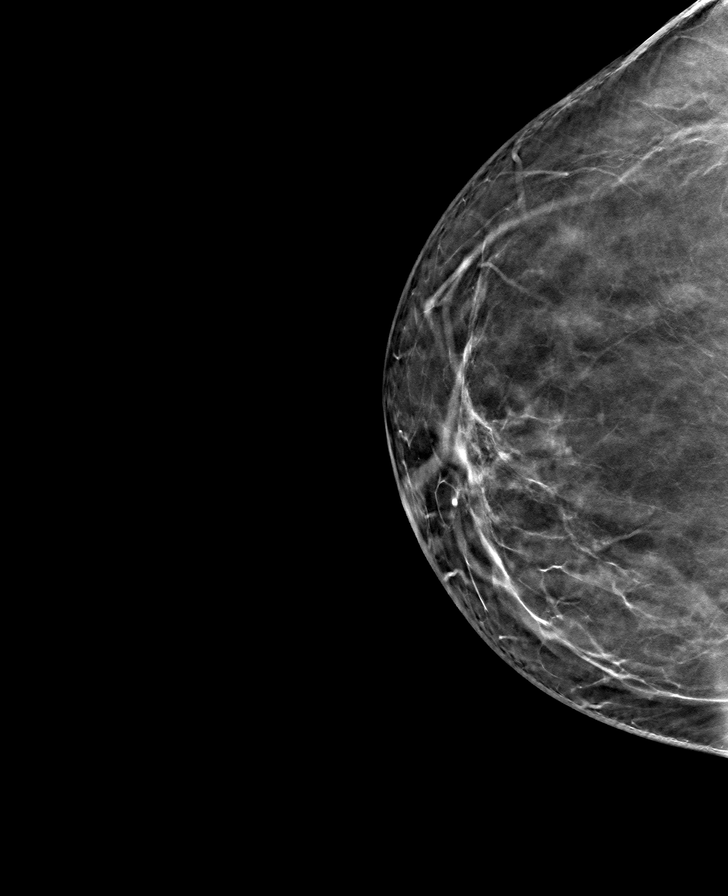

[R MLO tomo · tomo slice 49/97.0]
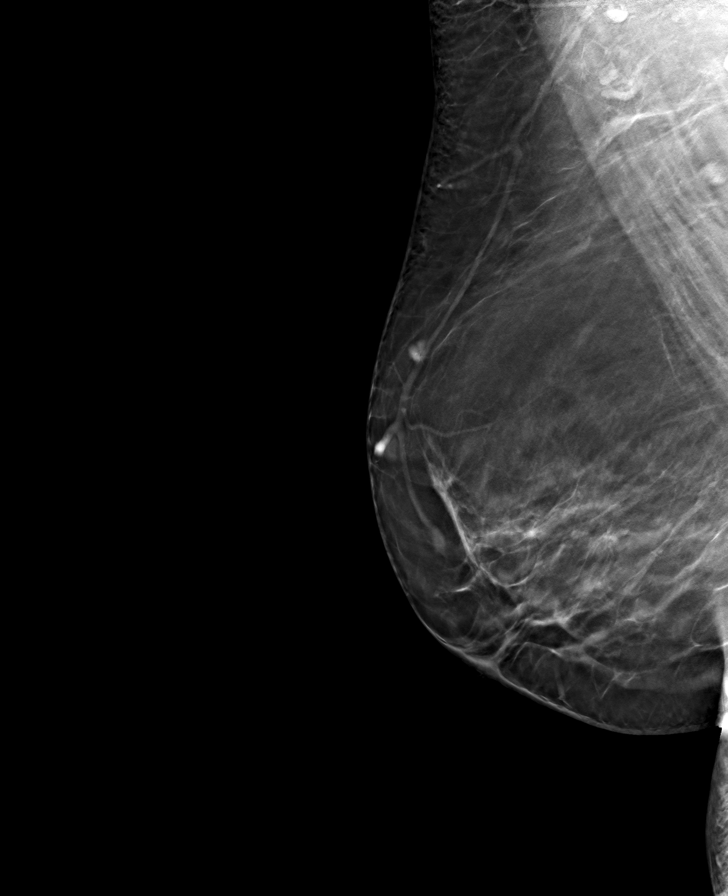

[L MLO tomo · tomo slice 50/99.0]
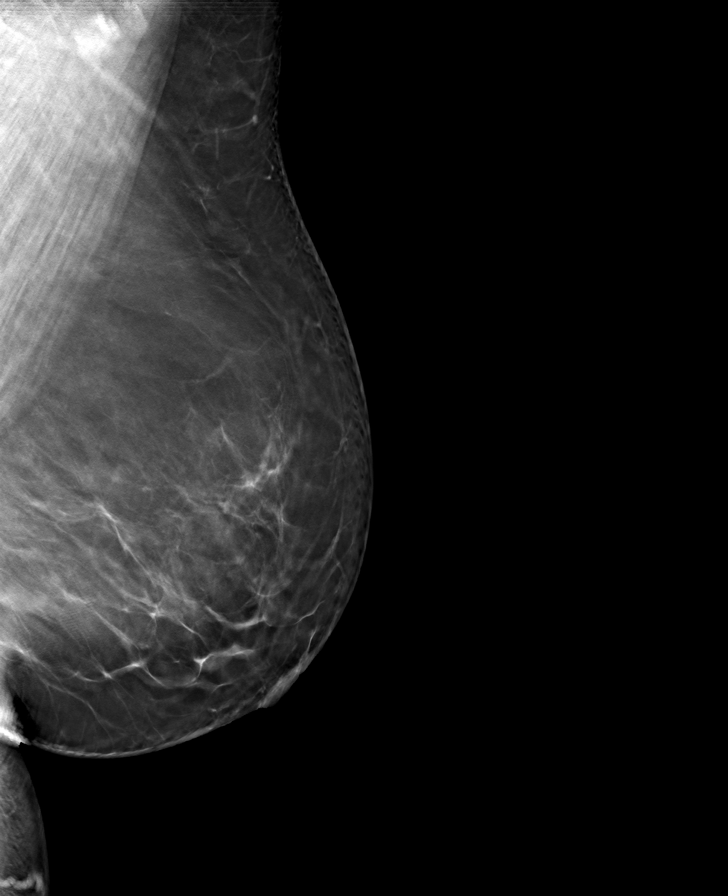

[L CC tomo · tomo slice 42/83.0]
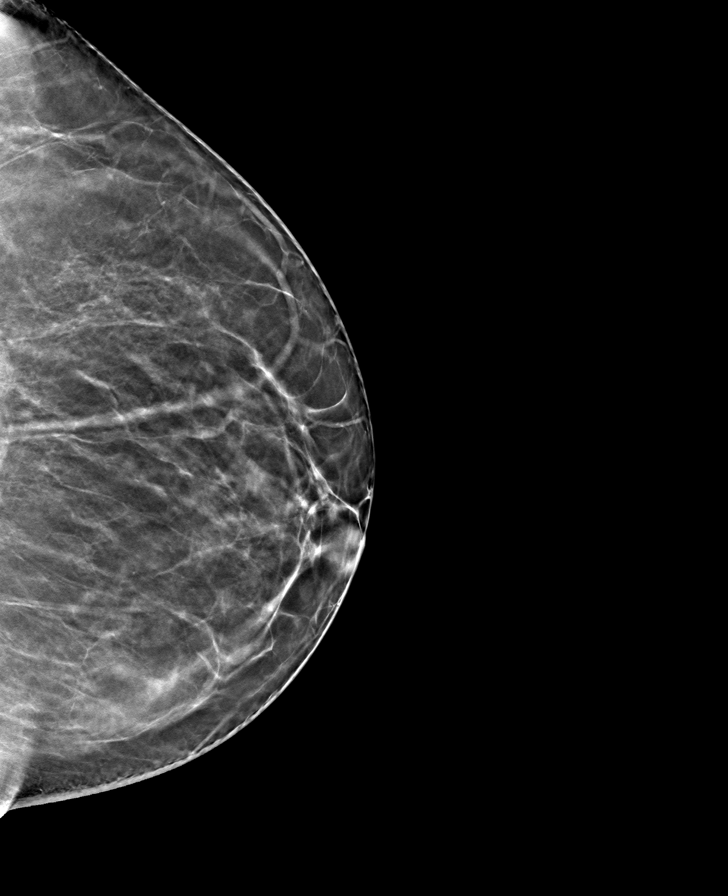

[8 of 24 positions shown; findings below may reference images not displayed]

ACR Breast Density Category b: There are scattered areas of
fibroglandular density.
FINDINGS: There are no findings suspicious for malignancy.
IMPRESSION: No mammographic evidence of malignancy. A result letter of this
screening mammogram will be mailed directly to the patient.

RECOMMENDATION:
Screening mammogram in one year. (Code:51-O-LD2)

BI-RADS CATEGORY  1: Negative.

## 2023-08-13 ENCOUNTER — Other Ambulatory Visit (INDEPENDENT_AMBULATORY_CARE_PROVIDER_SITE_OTHER): Payer: Self-pay | Admitting: Physician Assistant

## 2023-08-13 DIAGNOSIS — E559 Vitamin D deficiency, unspecified: Secondary | ICD-10-CM

## 2023-08-15 ENCOUNTER — Other Ambulatory Visit (INDEPENDENT_AMBULATORY_CARE_PROVIDER_SITE_OTHER): Payer: Self-pay | Admitting: Physician Assistant

## 2023-08-15 DIAGNOSIS — E7849 Other hyperlipidemia: Secondary | ICD-10-CM

## 2023-08-21 ENCOUNTER — Encounter (INDEPENDENT_AMBULATORY_CARE_PROVIDER_SITE_OTHER): Payer: Self-pay | Admitting: Family Medicine

## 2023-08-21 ENCOUNTER — Ambulatory Visit (INDEPENDENT_AMBULATORY_CARE_PROVIDER_SITE_OTHER): Payer: 59 | Admitting: Family Medicine

## 2023-08-21 ENCOUNTER — Telehealth (INDEPENDENT_AMBULATORY_CARE_PROVIDER_SITE_OTHER): Payer: 59 | Admitting: Psychology

## 2023-08-21 VITALS — BP 133/77 | HR 76 | Temp 98.5°F | Ht 67.0 in | Wt 311.0 lb

## 2023-08-21 DIAGNOSIS — F5089 Other specified eating disorder: Secondary | ICD-10-CM

## 2023-08-21 DIAGNOSIS — E669 Obesity, unspecified: Secondary | ICD-10-CM | POA: Diagnosis not present

## 2023-08-21 DIAGNOSIS — Z6841 Body Mass Index (BMI) 40.0 and over, adult: Secondary | ICD-10-CM | POA: Diagnosis not present

## 2023-08-21 DIAGNOSIS — E559 Vitamin D deficiency, unspecified: Secondary | ICD-10-CM

## 2023-08-21 MED ORDER — VITAMIN D (ERGOCALCIFEROL) 1.25 MG (50000 UNIT) PO CAPS: 50000.0000 [IU] | ORAL_CAPSULE | ORAL | 0 refills | Status: DC

## 2023-08-21 MED ORDER — TOPIRAMATE 25 MG PO TABS
ORAL_TABLET | ORAL | 0 refills | Status: DC
Start: 2023-08-21 — End: 2023-09-12

## 2023-08-21 MED ORDER — LOMAIRA 8 MG PO TABS: ORAL_TABLET | ORAL | 0 refills | Status: DC

## 2023-08-21 NOTE — Progress Notes (Unsigned)
Chief Complaint:   OBESITY Meredith Wright is here to discuss her progress with her obesity treatment plan along with follow-up of her obesity related diagnoses. [name] is on keeping a food journal and adhering to recommended goals of 1650-1800 calories and 125+ grams of protein and states she is following her eating plan approximately 50% of the time. [name] states she is doing chair exercises for 30 minutes 5 times per week.  Today's visit was #: 12 Starting weight: 308 lbs Starting date: 11/30/2022 Today's weight: 311 lbs Today's date: 08/21/2023 Total lbs lost to date: 0 Total lbs lost since last in-office visit: 1  Interim History: Patient moved her mother and she was not consistent in terms of her dinner. Upon review of her food log she is detailed and averaging between 1600-1750 calories daily and getting at least 110g of protein a day (most weeks average over 120g a day). She is feeling frustrated. She is not allowed to workout anymore.  Her insurance will not pay for [facility] or any other weight loss medication.  She is interested in alternatives.   Subjective:   1. Vitamin D deficiency Patient is on prescription vitamin D.  She denies nausea, vomiting, or muscle weakness but notes fatigue.  2. Other disorder of eating PDMP was checked with no concerns.  Patient is following her food logging with no significant loss.  Assessment/Plan:   1. Vitamin D deficiency We will refill prescription vitamin D 50,000 IU once weekly for 1 month.  Patient will need her labs repeated at her next appointment.  - Vitamin D, Ergocalciferol, (DRISDOL) 1.25 MG (50000 UNIT) CAPS capsule; Take 1 capsule (50,000 Units total) by mouth every 7 (seven) days.  Dispense: 4 capsule; Refill: 0  2. Other disorder of eating Risks and side effects of medications discussed with patient.  She understands that she will be taking Lomaira and Topiramate as a substitute for Qsymia due to cost.  She denies history of seizures  or kidney stones.  She does have a history of hypertension.  Controlled substance contract signed.  She will plan to start Lomaira 4mg  daily for 2 weeks then increase to 8mg  daily until next appointment.  She will start Topiramate 25mg  daily for 2 weeks then increase to 50mg  daily until next appointment.  She will notify provider if she has any adverse reactions to the medication.  She will continue food logging until next appointment.   - Phentermine HCl (LOMAIRA) 8 MG TABS; Take 0.5 tablets (4 mg total) by mouth daily for 14 days, THEN 1 tablet (8 mg total) daily for 14 days.  Dispense: 23 tablet; Refill: 0 - topiramate (TOPAMAX) 25 MG tablet; Take 1 tablet (25 mg total) by mouth daily for 14 days, THEN 2 tablets (50 mg total) daily for 14 days.  Dispense: 45 tablet; Refill: 0  3. BMI 45.0-49.9, adult (HCC)  4. Obesity with starting BMI of 49.2 [name] is currently in the action stage of change. As such, her goal is to continue with weight loss efforts. She has agreed to keeping a food journal and adhering to recommended goals of 1650-1800 calories and 115+ grams of protein daily.   Exercise goals: All adults should avoid inactivity. Some physical activity is better than none, and adults who participate in any amount of physical activity gain some health benefits.  Behavioral modification strategies: increasing lean protein intake, meal planning and cooking strategies, keeping healthy foods in the home, and planning for success.  [name] has  agreed to follow-up with our clinic in 3 weeks. She was informed of the importance of frequent follow-up visits to maximize her success with intensive lifestyle modifications for her multiple health conditions.   Objective:   Blood pressure 133/77, pulse 76, temperature 98.5 F (36.9 C), height 5\' 7"  (1.702 m), weight (!) 311 lb (141.1 kg), SpO2 97%. Body mass index is 48.71 kg/m.  General: Cooperative, alert, well developed, in no acute distress. HEENT:  Conjunctivae and lids unremarkable. Cardiovascular: Regular rhythm.  Lungs: Normal work of breathing. Neurologic: No focal deficits.   Lab Results  Component Value Date   CREATININE 0.81 11/30/2022   BUN 17 11/30/2022   NA 142 11/30/2022   K 4.0 11/30/2022   CL 103 11/30/2022   CO2 21 11/30/2022   Lab Results  Component Value Date   ALT 22 11/30/2022   AST 16 11/30/2022   ALKPHOS 142 (H) 11/30/2022   BILITOT 0.4 11/30/2022   Lab Results  Component Value Date   HGBA1C 6.1 (H) 11/30/2022   Lab Results  Component Value Date   INSULIN 13.7 11/30/2022   Lab Results  Component Value Date   TSH 1.520 11/30/2022   Lab Results  Component Value Date   CHOL 243 (H) 11/30/2022   HDL 42 11/30/2022   LDLCALC 157 (H) 11/30/2022   TRIG 236 (H) 11/30/2022   CHOLHDL 5.4 (H) 06/13/2022   Lab Results  Component Value Date   VD25OH 11.7 (L) 11/30/2022   Lab Results  Component Value Date   WBC 12.4 (H) 11/30/2022   HGB 13.3 11/30/2022   HCT 40.3 11/30/2022   MCV 83 11/30/2022   PLT 364 11/30/2022   Lab Results  Component Value Date   IRON 43 06/06/2013   TIBC 328 06/06/2013   Attestation Statements:   Reviewed by clinician on day of visit: allergies, medications, problem list, medical history, surgical history, family history, social history, and previous encounter notes.   I, Burt Knack, am acting as transcriptionist for Reuben Likes, MD. I have reviewed the above documentation for accuracy and completeness, and I agree with the above. - Reuben Likes, MD

## 2023-09-02 ENCOUNTER — Other Ambulatory Visit (INDEPENDENT_AMBULATORY_CARE_PROVIDER_SITE_OTHER): Payer: Self-pay | Admitting: Physician Assistant

## 2023-09-02 DIAGNOSIS — E7849 Other hyperlipidemia: Secondary | ICD-10-CM

## 2023-09-11 ENCOUNTER — Encounter (HOSPITAL_BASED_OUTPATIENT_CLINIC_OR_DEPARTMENT_OTHER): Payer: Self-pay | Admitting: Family Medicine

## 2023-09-11 ENCOUNTER — Ambulatory Visit (INDEPENDENT_AMBULATORY_CARE_PROVIDER_SITE_OTHER): Payer: 59 | Admitting: Family Medicine

## 2023-09-11 VITALS — BP 138/84 | HR 78 | Ht 68.0 in | Wt 311.2 lb

## 2023-09-11 DIAGNOSIS — E559 Vitamin D deficiency, unspecified: Secondary | ICD-10-CM

## 2023-09-11 DIAGNOSIS — I10 Essential (primary) hypertension: Secondary | ICD-10-CM

## 2023-09-11 DIAGNOSIS — Z0000 Encounter for general adult medical examination without abnormal findings: Secondary | ICD-10-CM | POA: Diagnosis not present

## 2023-09-11 DIAGNOSIS — R058 Other specified cough: Secondary | ICD-10-CM

## 2023-09-11 MED ORDER — BENZONATATE 200 MG PO CAPS: 200.0000 mg | ORAL_CAPSULE | Freq: Three times a day (TID) | ORAL | 0 refills | Status: DC | PRN

## 2023-09-11 NOTE — Assessment & Plan Note (Signed)
Blood pressure controlled in office today, she continues with amlodipine and valsartan.  Denies any issues with medications, no chest pain, headaches. Discussed options, given adequate control and continue to work on lifestyle modifications, can continue with current medication regimen with no changes to be made today. Recommend intermittent monitoring of blood pressure at home, DASH diet

## 2023-09-11 NOTE — Assessment & Plan Note (Signed)
Patient reports that she was sick a few weeks ago, however continues to have a tickle in her throat and a cough.  Overall, she is feeling much improved.  Has not had any significant shortness of breath, no fever, chills, sweats.  On exam, patient is in no acute distress, vital signs stable.  Cardiovascular exam with regular rate and rhythm, lungs clear to auscultation bilaterally. Suspect current symptoms are related to postviral cough syndrome given timing from recent illness and current clinical appearance.  At this time, recommend utilizing conservative measures to assist with controlling cough.  Would expect resolution over the next couple weeks or so.  If symptoms do persist, recommend returning to the office for further evaluation

## 2023-09-11 NOTE — Progress Notes (Signed)
    Procedures performed today:    None.  Independent interpretation of notes and tests performed by another provider:   None.  Brief History, Exam, Impression, and Recommendations:    BP 138/84 (BP Location: Left Arm, Patient Position: Sitting, Cuff Size: Normal)   Pulse 78   Ht 5\' 8"  (1.727 m)   Wt (!) 311 lb 3.2 oz (141.2 kg)   SpO2 97%   BMI 47.32 kg/m   Essential hypertension Assessment & Plan: Blood pressure controlled in office today, she continues with amlodipine and valsartan.  Denies any issues with medications, no chest pain, headaches. Discussed options, given adequate control and continue to work on lifestyle modifications, can continue with current medication regimen with no changes to be made today. Recommend intermittent monitoring of blood pressure at home, DASH diet   Post-viral cough syndrome Assessment & Plan: Patient reports that she was sick a few weeks ago, however continues to have a tickle in her throat and a cough.  Overall, she is feeling much improved.  Has not had any significant shortness of breath, no fever, chills, sweats.  On exam, patient is in no acute distress, vital signs stable.  Cardiovascular exam with regular rate and rhythm, lungs clear to auscultation bilaterally. Suspect current symptoms are related to postviral cough syndrome given timing from recent illness and current clinical appearance.  At this time, recommend utilizing conservative measures to assist with controlling cough.  Would expect resolution over the next couple weeks or so.  If symptoms do persist, recommend returning to the office for further evaluation   Wellness examination -     CBC with Differential/Platelet; Future -     Comprehensive metabolic panel; Future -     Hemoglobin A1c; Future -     Lipid panel; Future -     TSH Rfx on Abnormal to Free T4; Future -     VITAMIN D 25 Hydroxy (Vit-D Deficiency, Fractures); Future  Vitamin D deficiency Assessment &  Plan: Vitamin D notably low on prior labs through different provider.  Patient prescribed vitamin D supplement.  No issues reported.  Will plan to recheck vitamin D labs around time of next appointment for physical for monitoring  Orders: -     VITAMIN D 25 Hydroxy (Vit-D Deficiency, Fractures); Future  Other orders -     Benzonatate; Take 1 capsule (200 mg total) by mouth 3 (three) times daily as needed for cough.  Dispense: 45 capsule; Refill: 0  Return in about 4 months (around 01/09/2024) for CPE with fasting labs 1 week prior.   ___________________________________________ Meredith [place], MD, ABFM, CAQSM Primary Care and Sports Medicine [facility]

## 2023-09-11 NOTE — Assessment & Plan Note (Signed)
Vitamin D notably low on prior labs through different provider.  Patient prescribed vitamin D supplement.  No issues reported.  Will plan to recheck vitamin D labs around time of next appointment for physical for monitoring

## 2023-09-12 ENCOUNTER — Ambulatory Visit (INDEPENDENT_AMBULATORY_CARE_PROVIDER_SITE_OTHER): Payer: 59 | Admitting: Family Medicine

## 2023-09-12 ENCOUNTER — Encounter (INDEPENDENT_AMBULATORY_CARE_PROVIDER_SITE_OTHER): Payer: Self-pay | Admitting: Family Medicine

## 2023-09-12 VITALS — BP 138/82 | HR 73 | Temp 98.2°F | Ht 67.0 in | Wt 308.0 lb

## 2023-09-12 DIAGNOSIS — F5089 Other specified eating disorder: Secondary | ICD-10-CM | POA: Diagnosis not present

## 2023-09-12 DIAGNOSIS — E559 Vitamin D deficiency, unspecified: Secondary | ICD-10-CM | POA: Diagnosis not present

## 2023-09-12 DIAGNOSIS — E7849 Other hyperlipidemia: Secondary | ICD-10-CM

## 2023-09-12 DIAGNOSIS — Z6841 Body Mass Index (BMI) 40.0 and over, adult: Secondary | ICD-10-CM

## 2023-09-12 DIAGNOSIS — F509 Eating disorder, unspecified: Secondary | ICD-10-CM | POA: Insufficient documentation

## 2023-09-12 DIAGNOSIS — E669 Obesity, unspecified: Secondary | ICD-10-CM | POA: Diagnosis not present

## 2023-09-12 MED ORDER — LOMAIRA 8 MG PO TABS: 12.0000 mg | ORAL_TABLET | Freq: Every day | ORAL | 0 refills | Status: DC

## 2023-09-12 MED ORDER — ATORVASTATIN CALCIUM 10 MG PO TABS: 10.0000 mg | ORAL_TABLET | Freq: Every day | ORAL | 0 refills | Status: DC

## 2023-09-12 MED ORDER — TOPIRAMATE 50 MG PO TABS: 75.0000 mg | ORAL_TABLET | Freq: Every day | ORAL | 0 refills | Status: DC

## 2023-09-12 MED ORDER — VITAMIN D (ERGOCALCIFEROL) 1.25 MG (50000 UNIT) PO CAPS: 50000.0000 [IU] | ORAL_CAPSULE | ORAL | 0 refills | Status: DC

## 2023-09-12 NOTE — Assessment & Plan Note (Signed)
Discussed importance of vitamin d supplementation.  Vitamin d supplementation has been shown to decrease fatigue, decrease risk of progression to insulin resistance and then prediabetes, decreases risk of falling in older age and can even assist in decreasing depressive symptoms in PTSD.   Prescription for Vitamin D sent in. Needs Vitamin D level at next appt.

## 2023-09-12 NOTE — Progress Notes (Signed)
[facility] MEDICAL WEIGHT [facility] HEALTHY WEIGHT & WELLNESS AT  [address] [place] [address] Dept: [phone] Dept Fax: [phone]  SUBJECTIVE:  Chief Complaint: Obesity  Interim History: Patient voices that the last month she has been sick with bronchitis which she got her initial virus from her husband. She is still not feeling the best. She has been eating but her husband has been ordering out quite frequently. She has been logging consistently and has been around 1650 calories and getting in 120g of protein a day.  She mentions she is going to start her aerobics and wants to do some of the activity she used to.  Over the next few weeks she is anticipating her husband going back to work.  For Thanksgiving her son is cooking.  Stuart is here to discuss her progress with her obesity treatment plan. She is on the keeping a food journal and adhering to recommended goals of 1650-1800 calories and 115 grams of  protein and states she is following her eating plan approximately 100 % of the time. She states she is exercising 30 minutes 7  times per week.   OBJECTIVE: Visit Diagnoses: Problem List Items Addressed This Visit       Other   Vitamin D deficiency    Discussed importance of vitamin d supplementation.  Vitamin d supplementation has been shown to decrease fatigue, decrease risk of progression to insulin resistance and then prediabetes, decreases risk of falling in older age and can even assist in decreasing depressive symptoms in PTSD.   Prescription for Vitamin D sent in. Needs Vitamin D level at next appt.        Relevant Medications   Vitamin D, Ergocalciferol, (DRISDOL) 1.25 MG (50000 UNIT) CAPS capsule   Other hyperlipidemia    On lipitor daily.  No myalgias noted.  Needs refill of lipitor today. No change in dose- will need repeat labs at next appointment.       Relevant Medications   atorvastatin (LIPITOR) 10 MG tablet   Disorder of  eating    Starting weight of 311 with makeshift qsymia.  She is experiencing good control of food intake and is still able to get all calories and protein in. She denies any side effects.  Goal is to lose 16lbs by mid January. Needs to lose 13lbs more.  Refills of medications needed.      Relevant Medications   topiramate (TOPAMAX) 50 MG tablet   Phentermine HCl (LOMAIRA) 8 MG TABS    Vitals Temp: 98.2 F (36.8 C) BP: 138/82 Pulse Rate: 73 SpO2: 98 %   Anthropometric Measurements Height: 5\' 7"  (1.702 m) Weight: (!) 308 lb (139.7 kg) BMI (Calculated): 48.23 Weight at Last Visit: 311 lb Weight Lost Since Last Visit: 3 Weight Gained Since Last Visit: 0 Starting Weight: 308 lb Total Weight Loss (lbs): 4 lb (1.814 kg)   Body Composition  Body Fat %: 53.8 % Fat Mass (lbs): 165.8 lbs Muscle Mass (lbs): 135.2 lbs Total Body Water (lbs): 106.4 lbs Visceral Fat Rating : 19   Other Clinical Data Today's Visit #: 15 Starting Date: 11/30/22     ASSESSMENT AND PLAN:  Diet: Tikia is currently in the action stage of change. As such, her goal is to continue with weight loss efforts. She has agreed to keeping a food journal and adhering to recommended goals of 1650-1800 calories and 125 protein.  Exercise: Lajoya has been instructed that some exercise is better than none  for weight loss and overall health benefits.   Behavior Modification:  We discussed the following Behavioral Modification Strategies today: increasing lean protein intake, meal planning and cooking strategies, better snacking choices, and holiday eating strategies. We discussed various medication options to help Kandy with her weight loss efforts and we both agreed to increase dose of makeshift qsymia to 12mg /75mg .  No follow-ups on file.Marland Kitchen She was informed of the importance of frequent follow up visits to maximize her success with intensive lifestyle modifications for her multiple health conditions.  Attestation  Statements:   Reviewed by clinician on day of visit: allergies, medications, problem list, medical history, surgical history, family history, social history, and previous encounter notes.   Reuben Likes, MD

## 2023-09-12 NOTE — Assessment & Plan Note (Signed)
On lipitor daily.  No myalgias noted.  Needs refill of lipitor today. No change in dose- will need repeat labs at next appointment.

## 2023-09-12 NOTE — Assessment & Plan Note (Addendum)
Starting weight of 311 with makeshift qsymia.  She is experiencing good control of food intake and is still able to get all calories and protein in. She denies any side effects.  Goal is to lose 16lbs by mid January. Needs to lose 13lbs more.  Refills of medications needed and will increase to next dose of 12/75.

## 2023-10-04 ENCOUNTER — Other Ambulatory Visit (INDEPENDENT_AMBULATORY_CARE_PROVIDER_SITE_OTHER): Payer: Self-pay | Admitting: Family Medicine

## 2023-10-04 DIAGNOSIS — F5089 Other specified eating disorder: Secondary | ICD-10-CM

## 2023-10-11 ENCOUNTER — Encounter (INDEPENDENT_AMBULATORY_CARE_PROVIDER_SITE_OTHER): Payer: Self-pay | Admitting: Family Medicine

## 2023-10-11 ENCOUNTER — Ambulatory Visit (INDEPENDENT_AMBULATORY_CARE_PROVIDER_SITE_OTHER): Payer: 59 | Admitting: Family Medicine

## 2023-10-11 ENCOUNTER — Other Ambulatory Visit (INDEPENDENT_AMBULATORY_CARE_PROVIDER_SITE_OTHER): Payer: Self-pay | Admitting: Family Medicine

## 2023-10-11 VITALS — BP 151/72 | HR 81 | Temp 98.5°F | Ht 67.0 in | Wt 306.0 lb

## 2023-10-11 DIAGNOSIS — Z6841 Body Mass Index (BMI) 40.0 and over, adult: Secondary | ICD-10-CM

## 2023-10-11 DIAGNOSIS — E559 Vitamin D deficiency, unspecified: Secondary | ICD-10-CM | POA: Diagnosis not present

## 2023-10-11 DIAGNOSIS — F5089 Other specified eating disorder: Secondary | ICD-10-CM

## 2023-10-11 DIAGNOSIS — E7849 Other hyperlipidemia: Secondary | ICD-10-CM

## 2023-10-11 DIAGNOSIS — I10 Essential (primary) hypertension: Secondary | ICD-10-CM

## 2023-10-11 MED ORDER — LOMAIRA 8 MG PO TABS: 12.0000 mg | ORAL_TABLET | Freq: Every day | ORAL | 0 refills | Status: DC

## 2023-10-11 MED ORDER — ATORVASTATIN CALCIUM 10 MG PO TABS: 10.0000 mg | ORAL_TABLET | Freq: Every day | ORAL | 0 refills | Status: DC

## 2023-10-11 MED ORDER — TOPIRAMATE 50 MG PO TABS: 75.0000 mg | ORAL_TABLET | Freq: Every day | ORAL | 0 refills | Status: DC

## 2023-10-11 NOTE — Progress Notes (Signed)
SUBJECTIVE:  Chief Complaint: Obesity  Interim History: Patient has been logging food consistently since last appointment.  Her average calorie intake has been 1670, 1616, 1728 and 1582 over the last 4 weeks.  Her average protein intake has been 128, 142, 134 and 162g. She had roast for Thanksgiving.  She is done with her Christmas shopping. She has plans to get her labs redone for her PCP in February.   Meredith Wright is here to discuss her progress with her obesity treatment plan. She is on the keeping a food journal and adhering to recommended goals of 1650 to 1800 calories and 125 grams of protein and states she is following her eating plan approximately 90 % of the time. She states she is exercising 30 minutes 5 times per week.   OBJECTIVE: Visit Diagnoses: Problem List Items Addressed This Visit       Cardiovascular and Mediastinum   Essential hypertension - Primary    Blood pressure slightly elevated today.  Prior appointments BP has been well controlled.  She denies chest pain, chest pressure and headache.  She is on amlodipine and valsartan daily.  As this is the first elevated BP will make no changes to meds and follow up on BP at next appointment.       Relevant Medications   atorvastatin (LIPITOR) 10 MG tablet     Other   Vitamin D deficiency    Patient last lab done in January but no repeat lab done.  We will hold off on refilling prescription until labs are done in February to ensure we do not over replacement.      Other hyperlipidemia    Patient on lipitor daily.  She is denies any myalgias.  Needs refill of lipitor today.  3 month prescription sent in.        Relevant Medications   atorvastatin (LIPITOR) 10 MG tablet   Disorder of eating    Starting weight of 311 on makeshift qsymia.  She is now down 5lbs. She is on 12/75mg  dose.  She needs a refill of Phentermine/ Topiramate.  PDMP checked and no concerns.      Relevant Medications   Phentermine HCl (LOMAIRA) 8 MG  TABS   topiramate (TOPAMAX) 50 MG tablet    Vitals Temp: 98.5 F (36.9 C) BP: (!) 151/72 Pulse Rate: 81 SpO2: 99 %   Anthropometric Measurements Height: 5\' 7"  (1.702 m) Weight: (!) 306 lb (138.8 kg) BMI (Calculated): 47.92 Weight at Last Visit: 308 lb Weight Lost Since Last Visit: 2 Weight Gained Since Last Visit: 0 Starting Weight: 308 lb Total Weight Loss (lbs): 2 lb (0.907 kg)   Body Composition  Body Fat %: 52.8 % Fat Mass (lbs): 161.6 lbs Muscle Mass (lbs): 137.4 lbs Total Body Water (lbs): 102 lbs Visceral Fat Rating : 19   Other Clinical Data Today's Visit #: 16 Starting Date: 11/30/22     ASSESSMENT AND PLAN:  Diet: Meredith Wright is currently in the action stage of change. As such, her goal is to continue with weight loss efforts. She has agreed to keeping a food journal and adhering to recommended goals of 1650-1800 calories and 125 grams of protein daily.  Exercise: Meredith Wright has been instructed that some exercise is better than none and to continue exercising as is for weight loss and overall health benefits.   Behavior Modification:  We discussed the following Behavioral Modification Strategies today: increasing lean protein intake, increasing vegetables, meal planning and cooking strategies, and keep a  strict food journal. We discussed various medication options to help Meredith Wright with her weight loss efforts and we both agreed to continue current dose of lomaira and topiramate.  No follow-ups on file.Marland Kitchen She was informed of the importance of frequent follow up visits to maximize her success with intensive lifestyle modifications for her multiple health conditions.  Attestation Statements:   Reviewed by clinician on day of visit: allergies, medications, problem list, medical history, surgical history, family history, social history, and previous encounter notes.    Reuben Likes, MD

## 2023-10-11 NOTE — Assessment & Plan Note (Signed)
Patient last lab done in January but no repeat lab done.  We will hold off on refilling prescription until labs are done in February to ensure we do not over replacement.

## 2023-10-11 NOTE — Assessment & Plan Note (Signed)
Starting weight of 311 on makeshift qsymia.  She is now down 5lbs. She is on 12/75mg  dose.  She needs a refill of Phentermine/ Topiramate.  PDMP checked and no concerns.

## 2023-10-11 NOTE — Assessment & Plan Note (Signed)
Patient on lipitor daily.  She is denies any myalgias.  Needs refill of lipitor today.  3 month prescription sent in.

## 2023-10-11 NOTE — Assessment & Plan Note (Signed)
Blood pressure slightly elevated today.  Prior appointments BP has been well controlled.  She denies chest pain, chest pressure and headache.  She is on amlodipine and valsartan daily.  As this is the first elevated BP will make no changes to meds and follow up on BP at next appointment.

## 2023-10-12 ENCOUNTER — Ambulatory Visit (INDEPENDENT_AMBULATORY_CARE_PROVIDER_SITE_OTHER): Payer: 59 | Admitting: Family Medicine

## 2023-10-24 ENCOUNTER — Ambulatory Visit (INDEPENDENT_AMBULATORY_CARE_PROVIDER_SITE_OTHER): Payer: 59 | Admitting: Family Medicine

## 2023-10-30 ENCOUNTER — Ambulatory Visit: Payer: 59 | Admitting: Dermatology

## 2023-11-04 ENCOUNTER — Other Ambulatory Visit (INDEPENDENT_AMBULATORY_CARE_PROVIDER_SITE_OTHER): Payer: Self-pay | Admitting: Family Medicine

## 2023-11-04 DIAGNOSIS — E7849 Other hyperlipidemia: Secondary | ICD-10-CM

## 2023-11-07 ENCOUNTER — Other Ambulatory Visit (INDEPENDENT_AMBULATORY_CARE_PROVIDER_SITE_OTHER): Payer: Self-pay | Admitting: Family Medicine

## 2023-11-07 DIAGNOSIS — F5089 Other specified eating disorder: Secondary | ICD-10-CM

## 2023-11-09 ENCOUNTER — Ambulatory Visit (INDEPENDENT_AMBULATORY_CARE_PROVIDER_SITE_OTHER): Payer: 59 | Admitting: Family Medicine

## 2023-11-09 ENCOUNTER — Encounter (INDEPENDENT_AMBULATORY_CARE_PROVIDER_SITE_OTHER): Payer: Self-pay | Admitting: Family Medicine

## 2023-11-09 VITALS — BP 145/87 | HR 72 | Temp 98.1°F | Ht 67.0 in | Wt 303.0 lb

## 2023-11-09 DIAGNOSIS — E785 Hyperlipidemia, unspecified: Secondary | ICD-10-CM

## 2023-11-09 DIAGNOSIS — R7303 Prediabetes: Secondary | ICD-10-CM

## 2023-11-09 DIAGNOSIS — I10 Essential (primary) hypertension: Secondary | ICD-10-CM

## 2023-11-09 DIAGNOSIS — E559 Vitamin D deficiency, unspecified: Secondary | ICD-10-CM | POA: Diagnosis not present

## 2023-11-09 DIAGNOSIS — E7849 Other hyperlipidemia: Secondary | ICD-10-CM

## 2023-11-09 DIAGNOSIS — E669 Obesity, unspecified: Secondary | ICD-10-CM

## 2023-11-09 DIAGNOSIS — Z6841 Body Mass Index (BMI) 40.0 and over, adult: Secondary | ICD-10-CM

## 2023-11-09 DIAGNOSIS — F5089 Other specified eating disorder: Secondary | ICD-10-CM

## 2023-11-09 MED ORDER — LOMAIRA 8 MG PO TABS
12.0000 mg | ORAL_TABLET | Freq: Every day | ORAL | 0 refills | Status: DC
Start: 2023-11-09 — End: 2023-12-07

## 2023-11-09 MED ORDER — TOPIRAMATE 50 MG PO TABS: 75.0000 mg | ORAL_TABLET | Freq: Every day | ORAL | 0 refills | Status: DC

## 2023-11-09 NOTE — Progress Notes (Signed)
 .smr  Office: [phone]  /  Fax: [phone]  WEIGHT SUMMARY AND BIOMETRICS  Anthropometric Measurements Height: 5' 7 (1.702 m) Weight: (!) 303 lb (137.4 kg) BMI (Calculated): 47.45 Weight at Last Visit: 306 lb Weight Lost Since Last Visit: 3 lb Weight Gained Since Last Visit: 0 Starting Weight: 308 lb Total Weight Loss (lbs): 5 lb (2.268 kg)   Body Composition  Body Fat %: 52.7 % Fat Mass (lbs): 160 lbs Muscle Mass (lbs): 136.4 lbs Total Body Water (lbs): 100.6 lbs Visceral Fat Rating : 18   Other Clinical Data Fasting: Yes Labs: Yes Today's Visit #: 17 Starting Date: 11/30/22    Chief Complaint: OBESITY  History of Present Illness   The patient, with a history of obesity, hypertension, hyperlipidemia, and vitamin D  deficiency, presents for a follow-up visit. She reports adherence to prescribed medications, including Norvasc , Diovan , Lipitor, and a weekly dose of 50,000 international units of vitamin D . She has been working on diet and exercise for weight loss and blood pressure control.  Over the past month, she has lost three pounds, even during the holiday season. She reports adherence to a category four plan 100% of the time and engages in a combination of strengthening, resistance, and walking for 20-30 minutes, three times per week.  The patient also reports a history of multiple sclerosis (MS) and is currently under the care of a new physician following the passing of her previous MS specialist. She has been advised to maintain mask usage due to the ongoing COVID-19 pandemic.  She has been experiencing issues with her current medications, specifically phentermine  and topiramate , which are controlled substances and have been denied for refill. The patient is also on a prescription for vitamin D , which is due for a refill.  The patient's family has been supportive in her weight loss journey, assisting with meal preparation and encouraging adherence to dietary  changes. Despite the challenges, the patient remains determined to lose weight and improve her health.          PHYSICAL EXAM:  Blood pressure (!) 145/87, pulse 72, temperature 98.1 F (36.7 C), height 5' 7 (1.702 m), weight (!) 303 lb (137.4 kg), SpO2 95%. Body mass index is 47.46 kg/m.  DIAGNOSTIC DATA REVIEWED:  BMET    Component Value Date/Time   NA 142 11/30/2022 0929   K 4.0 11/30/2022 0929   CL 103 11/30/2022 0929   CO2 21 11/30/2022 0929   GLUCOSE 81 11/30/2022 0929   GLUCOSE 92 01/15/2021 1200   BUN 17 11/30/2022 0929   CREATININE 0.81 11/30/2022 0929   CREATININE 0.60 06/06/2013 0942   CALCIUM  9.3 11/30/2022 0929   GFRNONAA >60 01/15/2021 1200   GFRNONAA >89 06/06/2013 0942   GFRAA >60 12/20/2015 1814   GFRAA >89 06/06/2013 0942   Lab Results  Component Value Date   HGBA1C 6.1 (H) 11/30/2022   Lab Results  Component Value Date   INSULIN  13.7 11/30/2022   Lab Results  Component Value Date   TSH 1.520 11/30/2022   CBC    Component Value Date/Time   WBC 12.4 (H) 11/30/2022 0929   WBC 9.0 01/15/2021 1200   RBC 4.87 11/30/2022 0929   RBC 4.76 01/15/2021 1200   HGB 13.3 11/30/2022 0929   HCT 40.3 11/30/2022 0929   PLT 364 11/30/2022 0929   MCV 83 11/30/2022 0929   MCH 27.3 11/30/2022 0929   MCH 27.5 01/15/2021 1200   MCHC 33.0 11/30/2022 0929   MCHC 34.3 01/15/2021 1200  RDW 14.5 11/30/2022 0929   Iron  Studies    Component Value Date/Time   IRON  43 06/06/2013 0942   TIBC 328 06/06/2013 0942   IRONPCTSAT 13 (L) 06/06/2013 0942   Lipid Panel     Component Value Date/Time   CHOL 243 (H) 11/30/2022 0929   TRIG 236 (H) 11/30/2022 0929   HDL 42 11/30/2022 0929   CHOLHDL 5.4 (H) 06/13/2022 0923   CHOLHDL 4.2 01/15/2021 1200   VLDL 32 01/15/2021 1200   LDLCALC 157 (H) 11/30/2022 0929   Hepatic Function Panel     Component Value Date/Time   PROT 7.4 11/30/2022 0929   ALBUMIN 4.3 11/30/2022 0929   AST 16 11/30/2022 0929   ALT 22  11/30/2022 0929   ALKPHOS 142 (H) 11/30/2022 0929   BILITOT 0.4 11/30/2022 0929      Component Value Date/Time   TSH 1.520 11/30/2022 0929   Nutritional Lab Results  Component Value Date   VD25OH 11.7 (L) 11/30/2022     Assessment and Plan    Obesity   Patient is adhering to the category four plan, with a three-pound weight loss over the last month despite the holidays. She is on phentermine  and topiramate  for weight management and prefers not to use injectable medications.   - Continue category four plan   - Continue current exercise regimen (strengthening, resistance, and walking for 20-30 minutes, three times per week)   - Refill phentermine    - Refill topiramate    - Return to clinic in four weeks    Hypertension   Blood pressure initially elevated at 145/82 but normalized to 134/77 on repeat measurement. Patient is on Norvasc  and Diovan , and is working on diet, exercise, and weight loss to improve blood pressure.   - Continue Norvasc    - Continue Diovan    - Continue diet, exercise, and weight loss   - Check labs today    Hyperlipidemia   Patient is on Lipitor and following a low cholesterol diet, working on diet and exercise to improve cholesterol levels.   - Refill Lipitor   - Continue low cholesterol diet   - Continue exercise   - Check labs today    Prediabetes   Patient is managing prediabetes with diet and exercise, not currently on medications.   - Continue diet and exercise   - Check labs today    Vitamin D  Deficiency   Patient is on prescription vitamin D , 50,000 IU weekly.   - Refill prescription vitamin D    - Check labs today    Follow-up   - Return to clinic in four weeks.        She was informed of the importance of frequent follow up visits to maximize her success with intensive lifestyle modifications for her multiple health conditions.    Louann Penton, MD

## 2023-11-10 LAB — LIPID PANEL WITH LDL/HDL RATIO
Cholesterol, Total: 183 mg/dL (ref 100–199)
HDL: 40 mg/dL (ref >39)
LDL Chol Calc (NIH): 111 mg/dL — ABNORMAL HIGH (ref 0–99)
LDL/HDL Ratio: 2.8 {ratio} (ref 0.0–3.2)
Triglycerides: 180 mg/dL — ABNORMAL HIGH (ref 0–149)
VLDL Cholesterol Cal: 32 mg/dL (ref 5–40)

## 2023-11-10 LAB — CBC WITH DIFFERENTIAL/PLATELET
Basophils Absolute: 0.1 10*3/uL (ref 0.0–0.2)
Basos: 1 %
EOS (ABSOLUTE): 0.2 10*3/uL (ref 0.0–0.4)
Eos: 3 %
Hematocrit: 41.4 % (ref 34.0–46.6)
Hemoglobin: 13.7 g/dL (ref 11.1–15.9)
Immature Grans (Abs): 0 10*3/uL (ref 0.0–0.1)
Immature Granulocytes: 0 %
Lymphocytes Absolute: 1.7 10*3/uL (ref 0.7–3.1)
Lymphs: 19 %
MCH: 27.3 pg (ref 26.6–33.0)
MCHC: 33.1 g/dL (ref 31.5–35.7)
MCV: 83 fL (ref 79–97)
Monocytes Absolute: 0.7 10*3/uL (ref 0.1–0.9)
Monocytes: 7 %
Neutrophils Absolute: 6.2 10*3/uL (ref 1.4–7.0)
Neutrophils: 70 %
Platelets: 334 10*3/uL (ref 150–450)
RBC: 5.01 x10E6/uL (ref 3.77–5.28)
RDW: 15.3 % (ref 11.7–15.4)
WBC: 9 10*3/uL (ref 3.4–10.8)

## 2023-11-10 LAB — CMP14+EGFR
ALT: 20 [IU]/L (ref 0–32)
AST: 19 [IU]/L (ref 0–40)
Albumin: 4.3 g/dL (ref 3.8–4.9)
Alkaline Phosphatase: 137 [IU]/L — ABNORMAL HIGH (ref 44–121)
BUN/Creatinine Ratio: 27 — ABNORMAL HIGH (ref 9–23)
BUN: 20 mg/dL (ref 6–24)
Bilirubin Total: 0.2 mg/dL (ref 0.0–1.2)
CO2: 22 mmol/L (ref 20–29)
Calcium: 9.4 mg/dL (ref 8.7–10.2)
Chloride: 103 mmol/L (ref 96–106)
Creatinine, Ser: 0.74 mg/dL (ref 0.57–1.00)
Globulin, Total: 3.1 g/dL (ref 1.5–4.5)
Glucose: 91 mg/dL (ref 70–99)
Potassium: 4 mmol/L (ref 3.5–5.2)
Sodium: 140 mmol/L (ref 134–144)
Total Protein: 7.4 g/dL (ref 6.0–8.5)
eGFR: 98 mL/min/{1.73_m2} (ref >59)

## 2023-11-10 LAB — VITAMIN D 25 HYDROXY (VIT D DEFICIENCY, FRACTURES): Vit D, 25-Hydroxy: 43.7 ng/mL (ref 30.0–100.0)

## 2023-11-10 LAB — VITAMIN B12: Vitamin B-12: 674 pg/mL (ref 232–1245)

## 2023-11-10 LAB — HEMOGLOBIN A1C
Est. average glucose Bld gHb Est-mCnc: 123 mg/dL
Hgb A1c MFr Bld: 5.9 % — ABNORMAL HIGH (ref 4.8–5.6)

## 2023-11-10 LAB — INSULIN, RANDOM: INSULIN: 16.6 u[IU]/mL (ref 2.6–24.9)

## 2023-11-10 LAB — TSH: TSH: 2.76 u[IU]/mL (ref 0.450–4.500)

## 2023-11-10 LAB — FOLATE: Folate: 13 ng/mL (ref >3.0)

## 2023-11-21 ENCOUNTER — Other Ambulatory Visit (INDEPENDENT_AMBULATORY_CARE_PROVIDER_SITE_OTHER): Payer: Self-pay | Admitting: Family Medicine

## 2023-11-21 DIAGNOSIS — E559 Vitamin D deficiency, unspecified: Secondary | ICD-10-CM

## 2023-11-21 DIAGNOSIS — E7849 Other hyperlipidemia: Secondary | ICD-10-CM

## 2023-11-21 MED ORDER — ATORVASTATIN CALCIUM 10 MG PO TABS: 10.0000 mg | ORAL_TABLET | Freq: Every day | ORAL | 0 refills | Status: DC

## 2023-11-21 MED ORDER — VITAMIN D (ERGOCALCIFEROL) 1.25 MG (50000 UNIT) PO CAPS: 50000.0000 [IU] | ORAL_CAPSULE | ORAL | 0 refills | Status: DC

## 2023-12-01 ENCOUNTER — Other Ambulatory Visit (INDEPENDENT_AMBULATORY_CARE_PROVIDER_SITE_OTHER): Payer: Self-pay | Admitting: Family Medicine

## 2023-12-01 DIAGNOSIS — F5089 Other specified eating disorder: Secondary | ICD-10-CM

## 2023-12-07 ENCOUNTER — Ambulatory Visit (INDEPENDENT_AMBULATORY_CARE_PROVIDER_SITE_OTHER): Payer: 59 | Admitting: Family Medicine

## 2023-12-07 ENCOUNTER — Encounter (INDEPENDENT_AMBULATORY_CARE_PROVIDER_SITE_OTHER): Payer: Self-pay | Admitting: Family Medicine

## 2023-12-07 VITALS — BP 144/80 | HR 93 | Temp 98.3°F | Ht 67.0 in | Wt 307.0 lb

## 2023-12-07 DIAGNOSIS — I10 Essential (primary) hypertension: Secondary | ICD-10-CM

## 2023-12-07 DIAGNOSIS — E669 Obesity, unspecified: Secondary | ICD-10-CM

## 2023-12-07 DIAGNOSIS — E7849 Other hyperlipidemia: Secondary | ICD-10-CM | POA: Diagnosis not present

## 2023-12-07 DIAGNOSIS — R29818 Other symptoms and signs involving the nervous system: Secondary | ICD-10-CM | POA: Diagnosis not present

## 2023-12-07 DIAGNOSIS — F5089 Other specified eating disorder: Secondary | ICD-10-CM

## 2023-12-07 DIAGNOSIS — Z6841 Body Mass Index (BMI) 40.0 and over, adult: Secondary | ICD-10-CM

## 2023-12-07 MED ORDER — AMLODIPINE BESYLATE 5 MG PO TABS: 5.0000 mg | ORAL_TABLET | Freq: Every day | ORAL | 1 refills | Status: DC

## 2023-12-07 MED ORDER — LOMAIRA 8 MG PO TABS: 12.0000 mg | ORAL_TABLET | Freq: Every day | ORAL | 0 refills | Status: DC

## 2023-12-07 MED ORDER — TOPIRAMATE 50 MG PO TABS: 75.0000 mg | ORAL_TABLET | Freq: Every day | ORAL | 0 refills | Status: DC

## 2023-12-07 MED ORDER — ATORVASTATIN CALCIUM 10 MG PO TABS: 10.0000 mg | ORAL_TABLET | Freq: Every day | ORAL | 0 refills | Status: DC

## 2023-12-07 NOTE — Progress Notes (Unsigned)
   SUBJECTIVE:  Chief Complaint: Obesity  Interim History: Patient had a good holiday season- she initially lost weight over the holidays but has gained since last appointment.  She mentions she got her MS treatment 3 days ago.   She feels alright today.  She is not hungry at lunch but is eating full breakfast.  She will sometimes eat a sandwich at lunch or will drink a vanilla shake ([employer]).  For dinner she will make pasta and 8oz meat.   Leauna is here to discuss her progress with her obesity treatment plan. She is on the Category 4 Plan and states she is following her eating plan approximately 100 % of the time. She states she is not exercising.   OBJECTIVE: Visit Diagnoses: Problem List Items Addressed This Visit   None   Vitals Temp: 98.3 F (36.8 C) BP: (!) 144/80 Pulse Rate: 93 SpO2: 96 %   Anthropometric Measurements Height: 5\' 7"  (1.702 m) Weight: (!) 307 lb (139.3 kg) BMI (Calculated): 48.07 Weight at Last Visit: 303 lb Weight Lost Since Last Visit: 0 Weight Gained Since Last Visit: 4 Starting Weight: 308 lb Total Weight Loss (lbs): 1 lb (0.454 kg)   Body Composition  Body Fat %: 53.4 % Fat Mass (lbs): 164.2 lbs Muscle Mass (lbs): 136 lbs Total Body Water (lbs): 104.2 lbs Visceral Fat Rating : 19   Other Clinical Data Today's Visit #: 18 Starting Date: 11/30/22     ASSESSMENT AND PLAN:  Diet: Elaura is currently in the action stage of change. As such, her goal is to continue with weight loss efforts. She has agreed to follow Category 4 plan or monitoring calories and protein for lunch and dinner with goals of 350-500 calories and 30 or more grams of protein and 450-600 calories and 40 or more grams of protein.   Exercise: Deandra has been instructed that some exercise is better than none for weight loss and overall health benefits.   Behavior Modification:  We discussed the following Behavioral Modification Strategies today: increasing lean  protein intake, increasing vegetables, decrease liquid calories, no skipping meals, meal planning and cooking strategies, and better snacking choices. We discussed various medication options to help Adelaine with her weight loss efforts and we both agreed to ***.  No follow-ups on file.Marland Kitchen She was informed of the importance of frequent follow up visits to maximize her success with intensive lifestyle modifications for her multiple health conditions.  Attestation Statements:   Reviewed by clinician on day of visit: allergies, medications, problem list, medical history, surgical history, family history, social history, and previous encounter notes.      Reuben Likes, MD

## 2023-12-11 ENCOUNTER — Other Ambulatory Visit (INDEPENDENT_AMBULATORY_CARE_PROVIDER_SITE_OTHER): Payer: Self-pay | Admitting: Family Medicine

## 2023-12-11 DIAGNOSIS — E559 Vitamin D deficiency, unspecified: Secondary | ICD-10-CM

## 2023-12-17 DIAGNOSIS — R29818 Other symptoms and signs involving the nervous system: Secondary | ICD-10-CM | POA: Insufficient documentation

## 2023-12-17 NOTE — Assessment & Plan Note (Signed)
 Starting weight of 311 on makeshift qsymia.  She is now down 4lbs. She is on 12/75mg  dose.  She needs a refill of Phentermine / Topiramate .  PDMP checked and no concerns.  We discussed the need to ensure total nutrition intake when on these medications.

## 2023-12-17 NOTE — Assessment & Plan Note (Signed)
 BP slightly elevated today.  Patient denies chest pain, chest pressure, headache.  She needs a refill of her lipitor and her norvasc  today.  No change in dosage

## 2023-12-17 NOTE — Assessment & Plan Note (Signed)
 Minimal if any improvement in her fatigue and quality of sleep.  Patient is open to evaluation for OSA so referral sent to [facility] for sleep apnea eval.

## 2023-12-30 ENCOUNTER — Other Ambulatory Visit (INDEPENDENT_AMBULATORY_CARE_PROVIDER_SITE_OTHER): Payer: Self-pay | Admitting: Family Medicine

## 2023-12-30 DIAGNOSIS — F5089 Other specified eating disorder: Secondary | ICD-10-CM

## 2024-01-02 ENCOUNTER — Encounter (HOSPITAL_BASED_OUTPATIENT_CLINIC_OR_DEPARTMENT_OTHER): Payer: Self-pay

## 2024-01-02 ENCOUNTER — Ambulatory Visit (HOSPITAL_BASED_OUTPATIENT_CLINIC_OR_DEPARTMENT_OTHER): Payer: 59 | Admitting: *Deleted

## 2024-01-02 DIAGNOSIS — Z0000 Encounter for general adult medical examination without abnormal findings: Secondary | ICD-10-CM | POA: Diagnosis not present

## 2024-01-02 NOTE — Patient Instructions (Signed)
 Meredith Wright , Thank you for taking time to come for your Medicare Wellness Visit. I appreciate your ongoing commitment to your health goals. Please review the following plan we discussed and let me know if I can assist you in the future.   Screening recommendations/referrals: Colonoscopy: Education provided Mammogram: Education provided  Recommended yearly ophthalmology/optometry visit for glaucoma screening and checkup Recommended yearly dental visit for hygiene and checkup  Vaccinations: Influenza vaccine: Education provided Pneumococcal vaccine: Education provided Tdap vaccine: Education provided Shingles vaccine: Education provided       Preventive Care 65 Years and Older, Female Preventive care refers to lifestyle choices and visits with your health care provider that can promote health and wellness. What does preventive care include? A yearly physical exam. This is also called an annual well check. Dental exams once or twice a year. Routine eye exams. Ask your health care provider how often you should have your eyes checked. Personal lifestyle choices, including: Daily care of your teeth and gums. Regular physical activity. Eating a healthy diet. Avoiding tobacco and drug use. Limiting alcohol use. Practicing safe sex. Taking low-dose aspirin every day. Taking vitamin and mineral supplements as recommended by your health care provider. What happens during an annual well check? The services and screenings done by your health care provider during your annual well check will depend on your age, overall health, lifestyle risk factors, and family history of disease. Counseling  Your health care provider may ask you questions about your: Alcohol use. Tobacco use. Drug use. Emotional well-being. Home and relationship well-being. Sexual activity. Eating habits. History of falls. Memory and ability to understand (cognition). Work and work Astronomer. Reproductive  health. Screening  You may have the following tests or measurements: Height, weight, and BMI. Blood pressure. Lipid and cholesterol levels. These may be checked every 5 years, or more frequently if you are over 34 years old. Skin check. Lung cancer screening. You may have this screening every year starting at age 3 if you have a 30-pack-year history of smoking and currently smoke or have quit within the past 15 years. Fecal occult blood test (FOBT) of the stool. You may have this test every year starting at age 52. Flexible sigmoidoscopy or colonoscopy. You may have a sigmoidoscopy every 5 years or a colonoscopy every 10 years starting at age 52. Hepatitis C blood test. Hepatitis B blood test. Sexually transmitted disease (STD) testing. Diabetes screening. This is done by checking your blood sugar (glucose) after you have not eaten for a while (fasting). You may have this done every 1-3 years. Bone density scan. This is done to screen for osteoporosis. You may have this done starting at age 52. Mammogram. This may be done every 1-2 years. Talk to your health care provider about how often you should have regular mammograms. Talk with your health care provider about your test results, treatment options, and if necessary, the need for more tests. Vaccines  Your health care provider may recommend certain vaccines, such as: Influenza vaccine. This is recommended every year. Tetanus, diphtheria, and acellular pertussis (Tdap, Td) vaccine. You may need a Td booster every 10 years. Zoster vaccine. You may need this after age 52. Pneumococcal 13-valent conjugate (PCV13) vaccine. One dose is recommended after age 52. Pneumococcal polysaccharide (PPSV23) vaccine. One dose is recommended after age 52. Talk to your health care provider about which screenings and vaccines you need and how often you need them. This information is not intended to replace advice given to  you by your health care provider.  Make sure you discuss any questions you have with your health care provider. Document Released: 11/20/2015 Document Revised: 07/13/2016 Document Reviewed: 08/25/2015 Elsevier Interactive Patient Education  2017 [employer].  Fall Prevention in the Home Falls can cause injuries. They can happen to people of all ages. There are many things you can do to make your home safe and to help prevent falls. What can I do on the outside of my home? Regularly fix the edges of walkways and driveways and fix any cracks. Remove anything that might make you trip as you walk through a door, such as a raised step or threshold. Trim any bushes or trees on the path to your home. Use bright outdoor lighting. Clear any walking paths of anything that might make someone trip, such as rocks or tools. Regularly check to see if handrails are loose or broken. Make sure that both sides of any steps have handrails. Any raised decks and porches should have guardrails on the edges. Have any leaves, snow, or ice cleared regularly. Use sand or salt on walking paths during winter. Clean up any spills in your garage right away. This includes oil or grease spills. What can I do in the bathroom? Use night lights. Install grab bars by the toilet and in the tub and shower. Do not use towel bars as grab bars. Use non-skid mats or decals in the tub or shower. If you need to sit down in the shower, use a plastic, non-slip stool. Keep the floor dry. Clean up any water that spills on the floor as soon as it happens. Remove soap buildup in the tub or shower regularly. Attach bath mats securely with double-sided non-slip rug tape. Do not have throw rugs and other things on the floor that can make you trip. What can I do in the bedroom? Use night lights. Make sure that you have a light by your bed that is easy to reach. Do not use any sheets or blankets that are too big for your bed. They should not hang down onto the floor. Have a  firm chair that has side arms. You can use this for support while you get dressed. Do not have throw rugs and other things on the floor that can make you trip. What can I do in the kitchen? Clean up any spills right away. Avoid walking on wet floors. Keep items that you use a lot in easy-to-reach places. If you need to reach something above you, use a strong step stool that has a grab bar. Keep electrical cords out of the way. Do not use floor polish or wax that makes floors slippery. If you must use wax, use non-skid floor wax. Do not have throw rugs and other things on the floor that can make you trip. What can I do with my stairs? Do not leave any items on the stairs. Make sure that there are handrails on both sides of the stairs and use them. Fix handrails that are broken or loose. Make sure that handrails are as long as the stairways. Check any carpeting to make sure that it is firmly attached to the stairs. Fix any carpet that is loose or worn. Avoid having throw rugs at the top or bottom of the stairs. If you do have throw rugs, attach them to the floor with carpet tape. Make sure that you have a light switch at the top of the stairs and the bottom of the stairs.  If you do not have them, ask someone to add them for you. What else can I do to help prevent falls? Wear shoes that: Do not have high heels. Have rubber bottoms. Are comfortable and fit you well. Are closed at the toe. Do not wear sandals. If you use a stepladder: Make sure that it is fully opened. Do not climb a closed stepladder. Make sure that both sides of the stepladder are locked into place. Ask someone to hold it for you, if possible. Clearly mark and make sure that you can see: Any grab bars or handrails. First and last steps. Where the edge of each step is. Use tools that help you move around (mobility aids) if they are needed. These include: Canes. Walkers. Scooters. Crutches. Turn on the lights when you  go into a dark area. Replace any light bulbs as soon as they burn out. Set up your furniture so you have a clear path. Avoid moving your furniture around. If any of your floors are uneven, fix them. If there are any pets around you, be aware of where they are. Review your medicines with your doctor. Some medicines can make you feel dizzy. This can increase your chance of falling. Ask your doctor what other things that you can do to help prevent falls. This information is not intended to replace advice given to you by your health care provider. Make sure you discuss any questions you have with your health care provider. Document Released: 08/20/2009 Document Revised: 03/31/2016 Document Reviewed: 11/28/2014 Elsevier Interactive Patient Education  2017 [employer].

## 2024-01-02 NOTE — Progress Notes (Signed)
 Subjective:   Meredith Wright is a 53 y.o. female who presents for Medicare Annual (Subsequent) preventive examination.  Visit Complete: Virtual I connected with  Jamani A Roseland on 01/02/24 by a audio enabled telemedicine application and verified that I am speaking with the correct person using two identifiers.  Patient Location: Home  Provider Location: Home Office  I discussed the limitations of evaluation and management by telemedicine. The patient expressed understanding and agreed to proceed.  Vital Signs: Because this visit was a virtual/telehealth visit, some criteria may be missing or patient reported. Any vitals not documented were not able to be obtained and vitals that have been documented are patient reported.  Cardiac Risk Factors include: advanced age (>68men, >51 women);hypertension;family history of premature cardiovascular disease;obesity (BMI >30kg/m2)     Objective:    There were no vitals filed for this visit. There is no height or weight on file to calculate BMI.     04/26/2023    7:33 PM 01/03/2023    2:02 PM 05/11/2021    9:41 AM 12/20/2015    5:45 PM  Advanced Directives  Does Patient Have a Medical Advance Directive? No No No No  Would patient like information on creating a medical advance directive? No - Patient declined No - Patient declined Yes (MAU/Ambulatory/Procedural Areas - Information given) No - patient declined information    Current Medications (verified) Outpatient Encounter Medications as of 01/02/2024  Medication Sig   amLODipine (NORVASC) 5 MG tablet Take 1 tablet (5 mg total) by mouth daily.   atorvastatin (LIPITOR) 10 MG tablet Take 1 tablet (10 mg total) by mouth daily.   diclofenac (VOLTAREN) 75 MG EC tablet TAKE 1 TABLET BY MOUTH TWICE A DAY   gabapentin (NEURONTIN) 300 MG capsule Take 1 capsule by mouth 4 (four) times daily.   levETIRAcetam (KEPPRA) 500 MG tablet Take 500 mg by mouth 2 (two) times daily.   Multiple Vitamin  (MULTIVITAMIN) tablet Take 1 tablet by mouth daily.   ocrelizumab (OCREVUS) 300 MG/10ML injection Inject 1 Dose into the vein every 6 (six) months.   omeprazole (PRILOSEC) 20 MG capsule Take 20 mg by mouth daily.   Phentermine HCl (LOMAIRA) 8 MG TABS Take 1.5 tablets (12 mg total) by mouth daily.   solifenacin (VESICARE) 5 MG tablet Take 1 tablet by mouth in the morning and at bedtime.   topiramate (TOPAMAX) 50 MG tablet Take 1.5 tablets (75 mg total) by mouth daily.   valsartan (DIOVAN) 160 MG tablet Take 1 tablet (160 mg total) by mouth daily.   Vitamin D, Ergocalciferol, (DRISDOL) 1.25 MG (50000 UNIT) CAPS capsule Take 1 capsule (50,000 Units total) by mouth every 7 (seven) days.   benzonatate (TESSALON) 200 MG capsule Take 1 capsule (200 mg total) by mouth 3 (three) times daily as needed for cough. (Patient not taking: Reported on 01/02/2024)   No facility-administered encounter medications on file as of 01/02/2024.    Allergies (verified) Lactose intolerance (gi)   History: Past Medical History:  Diagnosis Date   Arthritis    Joint pain    Multiple food allergies    Multiple sclerosis (HCC)    Sickle cell anemia (HCC)    trait   Past Surgical History:  Procedure Laterality Date   BRAIN SURGERY  11/07/1996   Biopsy -Dx MS   CESAREAN SECTION     Family History  Problem Relation Age of Onset   Heart disease Mother        murmur  Heart disease Daughter        murmur   Heart disease Maternal Grandmother    Diabetes Maternal Grandmother    Breast cancer Neg Hx    Social History   Socioeconomic History   Marital status: Married    Spouse name: Not on file   Number of children: Not on file   Years of education: Not on file   Highest education level: Associate degree: occupational, Scientist, product/process development, or vocational program  Occupational History   Occupation: Stay at home  Tobacco Use   Smoking status: Never    Passive exposure: Never   Smokeless tobacco: Never  Vaping Use    Vaping status: Never Used  Substance and Sexual Activity   Alcohol use: Never   Drug use: Never   Sexual activity: Yes  Other Topics Concern   Not on file  Social History Narrative   On Disability Secondary To Multiple Sclerosis   Does not work sec to this      Married. 2 children.   Son: 66 y/o   Daughter: 15 y/o   Social Drivers of Corporate investment banker Strain: Low Risk  (01/02/2024)   Overall Financial Resource Strain (CARDIA)    Difficulty of Paying Living Expenses: Not hard at all  Food Insecurity: No Food Insecurity (01/02/2024)   Hunger Vital Sign    Worried About Running Out of Food in the Last Year: Never true    Ran Out of Food in the Last Year: Never true  Transportation Needs: No Transportation Needs (01/02/2024)   PRAPARE - Administrator, Civil Service (Medical): No    Lack of Transportation (Non-Medical): No  Physical Activity: Insufficiently Active (01/02/2024)   Exercise Vital Sign    Days of Exercise per Week: 2 days    Minutes of Exercise per Session: 30 min  Stress: No Stress Concern Present (01/02/2024)   [employer] of Occupational Health - Occupational Stress Questionnaire    Feeling of Stress : Not at all  Social Connections: Moderately Isolated (01/02/2024)   Social Connection and Isolation Panel [NHANES]    Frequency of Communication with Friends and Family: More than three times a week    Frequency of Social Gatherings with Friends and Family: Once a week    Attends Religious Services: Never    Database administrator or Organizations: No    Attends Engineer, structural: Never    Marital Status: Married    Tobacco Counseling Counseling given: Not Answered   Clinical Intake:  Pre-visit preparation completed: Yes  Pain : No/denies pain     Diabetes: No  How often do you need to have someone help you when you read instructions, pamphlets, or other written materials from your doctor or pharmacy?: 1 -  Never  Interpreter Needed?: No  Information entered by :: Remi Haggard LPN   Activities of Daily Living    01/02/2024    3:30 PM 01/09/2023    2:57 PM  In your present state of health, do you have any difficulty performing the following activities:  Hearing? 0 0  Vision? 0 0  Difficulty concentrating or making decisions? 1 0  Walking or climbing stairs? 0 0  Dressing or bathing? 0 0  Doing errands, shopping? 0 0  Preparing Food and eating ? N   Using the Toilet? N   In the past six months, have you accidently leaked urine? Y   Do you have problems with loss of bowel  control? N   Managing your Medications? N   Managing your Finances? N   Housekeeping or managing your Housekeeping? N     Patient Care Team: [place], Buren Kos, MD as PCP - General (Family Medicine)  Indicate any recent Medical Services you may have received from other than [facility] providers in the past year (date may be approximate).     Assessment:   This is a routine wellness examination for Meredith Wright.  Hearing/Vision screen Hearing Screening - Comments:: No trouble hearing Vision Screening - Comments:: Up to date [facility]  [facility]   Goals Addressed               This Visit's Progress     Patient stated (pt-stated)   On track     Just take things one day at a time.      Patient Stated        Be alive       Depression Screen    01/02/2024    3:35 PM 09/11/2023   10:40 AM 06/12/2023    2:23 PM 04/04/2023    1:17 PM 01/09/2023    2:57 PM 01/03/2023    2:00 PM 01/03/2023    1:59 PM  PHQ 2/9 Scores  PHQ - 2 Score 3 0 0 0 0 0 0  PHQ- 9 Score 6 0 0  0    Exception Documentation   Medical reason Medical reason Medical reason      Fall Risk    01/02/2024    3:27 PM 09/11/2023   10:40 AM 06/12/2023    2:23 PM 04/04/2023    1:17 PM 01/09/2023    2:57 PM  Fall Risk   Falls in the past year? 0 0 0 1 0  Number falls in past yr: 0 0 0 1 0  Injury with Fall? 0 0 0 1 0  Risk for fall due to : Impaired  balance/gait No Fall Risks No Fall Risks History of fall(s) No Fall Risks  Follow up Falls evaluation completed;Education provided;Falls prevention discussed Falls evaluation completed Falls evaluation completed Falls evaluation completed Falls evaluation completed    MEDICARE RISK AT HOME: Medicare Risk at Home Any stairs in or around the home?: Yes If so, are there any without handrails?: No Home free of loose throw rugs in walkways, pet beds, electrical cords, etc?: Yes Adequate lighting in your home to reduce risk of falls?: Yes Life alert?: No Use of a cane, walker or w/c?: Yes Grab bars in the bathroom?: No Shower chair or bench in shower?: No Elevated toilet seat or a handicapped toilet?: No  TIMED UP AND GO:  Was the test performed?  No    Cognitive Function:        01/02/2024    3:30 PM 01/03/2023    2:03 PM  6CIT Screen  What Year? 0 points 0 points  What month? 0 points 0 points  What time? 0 points 0 points  Count back from 20 0 points 0 points  Months in reverse 4 points 0 points  Repeat phrase 2 points 0 points  Total Score 6 points 0 points    Immunizations Immunization History  Administered Date(s) Administered   Influenza Inj Mdck Quad Pf 11/11/2017   Influenza Split 02/05/2014   Influenza,inj,Quad PF,6+ Mos 09/22/2022   Influenza,inj,quad, With Preservative 11/11/2017   Influenza-Unspecified 02/05/2014, 11/11/2017, 09/29/2021   Moderna Covid-19 Vaccine Bivalent Booster 79yrs & up 01/11/2022   Moderna Sars-Covid-2 Vaccination 01/23/2020, 02/21/2020, 10/17/2020,  04/15/2021   Tdap 06/06/2013, 02/05/2014    TDAP status: Up to date  Flu Vaccine status: Due, Education has been provided regarding the importance of this vaccine. Advised may receive this vaccine at local pharmacy or Health Dept. Aware to provide a copy of the vaccination record if obtained from local pharmacy or Health Dept. Verbalized acceptance and understanding.  Pneumococcal vaccine  status: Due, Education has been provided regarding the importance of this vaccine. Advised may receive this vaccine at local pharmacy or Health Dept. Aware to provide a copy of the vaccination record if obtained from local pharmacy or Health Dept. Verbalized acceptance and understanding.  Covid-19 vaccine status: Information provided on how to obtain vaccines.   Qualifies for Shingles Vaccine? Yes   Zostavax completed No   Shingrix Completed?: No.    Education has been provided regarding the importance of this vaccine. Patient has been advised to call insurance company to determine out of pocket expense if they have not yet received this vaccine. Advised may also receive vaccine at local pharmacy or Health Dept. Verbalized acceptance and understanding.  Screening Tests Health Maintenance  Topic Date Due   Pneumococcal Vaccine 58-12 Years old (1 of 2 - PCV) Never done   Zoster Vaccines- Shingrix (1 of 2) Never done   COVID-19 Vaccine (6 - 2024-25 season) 07/09/2023   Colonoscopy  01/04/2024 (Originally 01/21/2017)   Hepatitis C Screening  01/04/2024 (Originally 01/21/1990)   HIV Screening  01/04/2024 (Originally 01/22/1987)   INFLUENZA VACCINE  02/05/2024 (Originally 06/08/2023)   DTaP/Tdap/Td (3 - Td or Tdap) 02/06/2024   Medicare Annual Wellness (AWV)  01/01/2025   MAMMOGRAM  02/02/2025   Cervical Cancer Screening (HPV/Pap Cotest)  04/23/2026   HPV VACCINES  Aged Out    Health Maintenance  Health Maintenance Due  Topic Date Due   Pneumococcal Vaccine 19-43 Years old (1 of 2 - PCV) Never done   Zoster Vaccines- Shingrix (1 of 2) Never done   COVID-19 Vaccine (6 - 2024-25 season) 07/09/2023    Colonoscopy Education provided  Mammogram status: Completed  . Repeat every year   Lung Cancer Screening: (Low Dose CT Chest recommended if Age 61-80 years, 20 pack-year currently smoking OR have quit w/in 15years.) does not qualify.   Lung Cancer Screening Referral:   Additional  Screening:  Hepatitis C Screening   never done  Vision Screening: Recommended annual ophthalmology exams for early detection of glaucoma and other disorders of the eye. Is the patient up to date with their annual eye exam?  Yes  Who is the provider or what is the name of the office in which the patient attends annual eye exams? [facility] If pt is not established with a provider, would they like to be referred to a provider to establish care? No .   Dental Screening: Recommended annual dental exams for proper oral hygiene    Community Resource Referral / Chronic Care Management: CRR required this visit?  No   CCM required this visit?  No     Plan:     I have personally reviewed and noted the following in the patient's chart:   Medical and social history Use of alcohol, tobacco or illicit drugs  Current medications and supplements including opioid prescriptions. Patient is not currently taking opioid prescriptions. Functional ability and status Nutritional status Physical activity Advanced directives List of other physicians Hospitalizations, surgeries, and ER visits in previous 12 months Vitals Screenings to include cognitive, depression, and falls Referrals and appointments  In addition, I have reviewed and discussed with patient certain preventive protocols, quality metrics, and best practice recommendations. A written personalized care plan for preventive services as well as general preventive health recommendations were provided to patient.     Remi Haggard, LPN   [id number]/61/[id number]   After Visit Summary: (MyChart) Due to this being a telephonic visit, the after visit summary with patients personalized plan was offered to patient via MyChart   Nurse Notes:

## 2024-01-04 ENCOUNTER — Encounter (INDEPENDENT_AMBULATORY_CARE_PROVIDER_SITE_OTHER): Payer: Self-pay | Admitting: Family Medicine

## 2024-01-04 ENCOUNTER — Telehealth (INDEPENDENT_AMBULATORY_CARE_PROVIDER_SITE_OTHER): Payer: Self-pay

## 2024-01-04 ENCOUNTER — Other Ambulatory Visit (HOSPITAL_BASED_OUTPATIENT_CLINIC_OR_DEPARTMENT_OTHER): Payer: 59

## 2024-01-04 ENCOUNTER — Encounter (INDEPENDENT_AMBULATORY_CARE_PROVIDER_SITE_OTHER): Payer: Self-pay

## 2024-01-04 ENCOUNTER — Encounter (INDEPENDENT_AMBULATORY_CARE_PROVIDER_SITE_OTHER): Payer: 59 | Admitting: Family Medicine

## 2024-01-04 NOTE — Telephone Encounter (Signed)
 Cancelled appointment for today, Pt has been sick since Friday 12/29/23. Advised pt to see PCP to get checked out. She verbalized understanding. CS

## 2024-01-09 ENCOUNTER — Encounter (HOSPITAL_BASED_OUTPATIENT_CLINIC_OR_DEPARTMENT_OTHER): Payer: 59

## 2024-01-11 ENCOUNTER — Encounter (HOSPITAL_BASED_OUTPATIENT_CLINIC_OR_DEPARTMENT_OTHER): Payer: Self-pay | Admitting: Family Medicine

## 2024-01-11 ENCOUNTER — Ambulatory Visit (INDEPENDENT_AMBULATORY_CARE_PROVIDER_SITE_OTHER): Payer: 59 | Admitting: Family Medicine

## 2024-01-11 VITALS — BP 132/87 | HR 77 | Ht 68.0 in | Wt 312.3 lb

## 2024-01-11 DIAGNOSIS — Z0000 Encounter for general adult medical examination without abnormal findings: Secondary | ICD-10-CM | POA: Diagnosis not present

## 2024-01-11 DIAGNOSIS — Z1211 Encounter for screening for malignant neoplasm of colon: Secondary | ICD-10-CM

## 2024-01-11 NOTE — Assessment & Plan Note (Signed)
 Routine HCM labs reviewed. HCM reviewed/discussed. Anticipatory guidance regarding healthy weight, lifestyle and choices given. Recommend healthy diet.  Recommend approximately 150 minutes/week of moderate intensity exercise Recommend regular dental and vision exams Always use seatbelt/lap and shoulder restraints Recommend using smoke alarms and checking batteries at least twice a year Recommend using sunscreen when outside Discussed colon cancer screening recommendations, options.  Patient would like to proceed with Cologuard, order placed Discussed recommendations for shingles vaccine.  Patient will consider Discussed tetanus immunization recommendations, patient is UTD

## 2024-01-11 NOTE — Progress Notes (Signed)
 Subjective:    CC: Annual Physical Exam  HPI:  Meredith Wright is a 53 y.o. presenting for annual physical  I reviewed the past medical history, family history, social history, surgical history, and allergies today and no changes were needed.  Please see the problem list section below in epic for further details.  Past Medical History: Past Medical History:  Diagnosis Date   Arthritis    Joint pain    Multiple food allergies    Multiple sclerosis (HCC)    Sickle cell anemia (HCC)    trait   Past Surgical History: Past Surgical History:  Procedure Laterality Date   BRAIN SURGERY  11/07/1996   Biopsy -Dx MS   CESAREAN SECTION     Social History: Social History   Socioeconomic History   Marital status: Married    Spouse name: Not on file   Number of children: Not on file   Years of education: Not on file   Highest education level: Associate degree: academic program  Occupational History   Occupation: Stay at home  Tobacco Use   Smoking status: Never    Passive exposure: Never   Smokeless tobacco: Never  Vaping Use   Vaping status: Never Used  Substance and Sexual Activity   Alcohol use: Never   Drug use: Never   Sexual activity: Yes  Other Topics Concern   Not on file  Social History Narrative   On Disability Secondary To Multiple Sclerosis   Does not work sec to this      Married. 2 children.   Son: 31 y/o   Daughter: 59 y/o   Social Drivers of Corporate investment banker Strain: Low Risk  (01/08/2024)   Overall Financial Resource Strain (CARDIA)    Difficulty of Paying Living Expenses: Not very hard  Food Insecurity: Food Insecurity Present (01/08/2024)   Hunger Vital Sign    Worried About Running Out of Food in the Last Year: Sometimes true    Ran Out of Food in the Last Year: Sometimes true  Transportation Needs: No Transportation Needs (01/02/2024)   PRAPARE - Administrator, Civil Service (Medical): No    Lack of Transportation  (Non-Medical): No  Physical Activity: Insufficiently Active (01/08/2024)   Exercise Vital Sign    Days of Exercise per Week: 2 days    Minutes of Exercise per Session: 30 min  Stress: No Stress Concern Present (01/08/2024)   [employer] of Occupational Health - Occupational Stress Questionnaire    Feeling of Stress : Not at all  Social Connections: Moderately Isolated (01/08/2024)   Social Connection and Isolation Panel [NHANES]    Frequency of Communication with Friends and Family: Twice a week    Frequency of Social Gatherings with Friends and Family: Once a week    Attends Religious Services: Never    Database administrator or Organizations: No    Attends Engineer, structural: Never    Marital Status: Married   Family History: Family History  Problem Relation Age of Onset   Heart disease Mother        murmur   Heart disease Daughter        murmur   Heart disease Maternal Grandmother    Diabetes Maternal Grandmother    Breast cancer Neg Hx    Allergies: Allergies  Allergen Reactions   Lactose Intolerance (Gi) Diarrhea   Medications: See med rec.  Review of Systems: No headache, visual changes, nausea, vomiting, diarrhea,  constipation, dizziness, abdominal pain, skin rash, fevers, chills, night sweats, swollen lymph nodes, weight loss, chest pain, body aches, joint swelling, muscle aches, shortness of breath, mood changes, visual or auditory hallucinations.  Objective:    BP 132/87 (BP Location: Right Arm, Patient Position: Sitting, Cuff Size: Normal)   Pulse 77   Ht 5\' 8"  (1.727 m)   Wt (!) 312 lb 4.8 oz (141.7 kg)   SpO2 96%   BMI 47.49 kg/m   General: Well Developed, well nourished, and in no acute distress. Neuro: Alert and oriented x3, extra-ocular muscles intact, sensation grossly intact. Cranial nerves II through XII are intact, motor, sensory, and coordinative functions are all intact. HEENT: Normocephalic, atraumatic, pupils equal round reactive to  light, neck supple, no masses, no lymphadenopathy, thyroid nonpalpable. Oropharynx, nasopharynx, external ear canals are unremarkable. Skin: Warm and dry, no rashes noted.  Cardiac: Regular rate and rhythm, no murmurs rubs or gallops.  Respiratory: Clear to auscultation bilaterally. Not using accessory muscles, speaking in full sentences.  Abdominal: Soft, nontender, nondistended, positive bowel sounds, no masses, no organomegaly.  Musculoskeletal: Shoulder, elbow, wrist, hip, knee, ankle stable, and with full range of motion.  Impression and Recommendations:    Wellness examination Assessment & Plan: Routine HCM labs reviewed. HCM reviewed/discussed. Anticipatory guidance regarding healthy weight, lifestyle and choices given. Recommend healthy diet.  Recommend approximately 150 minutes/week of moderate intensity exercise Recommend regular dental and vision exams Always use seatbelt/lap and shoulder restraints Recommend using smoke alarms and checking batteries at least twice a year Recommend using sunscreen when outside Discussed colon cancer screening recommendations, options.  Patient would like to proceed with Cologuard, order placed Discussed recommendations for shingles vaccine.  Patient will consider Discussed tetanus immunization recommendations, patient is UTD   Colon cancer screening -     Cologuard  Return in about 4 months (around 05/12/2024) for hypertension.   ___________________________________________ Jeimy Bickert de [place], MD, ABFM, CAQSM Primary Care and Sports [facility]

## 2024-01-11 NOTE — Patient Instructions (Signed)

## 2024-01-15 ENCOUNTER — Ambulatory Visit (INDEPENDENT_AMBULATORY_CARE_PROVIDER_SITE_OTHER): Payer: 59 | Admitting: Family Medicine

## 2024-01-15 ENCOUNTER — Encounter (INDEPENDENT_AMBULATORY_CARE_PROVIDER_SITE_OTHER): Payer: Self-pay | Admitting: Family Medicine

## 2024-01-15 VITALS — BP 132/81 | HR 76 | Temp 98.2°F | Ht 67.0 in | Wt 310.0 lb

## 2024-01-15 DIAGNOSIS — R29818 Other symptoms and signs involving the nervous system: Secondary | ICD-10-CM | POA: Diagnosis not present

## 2024-01-15 DIAGNOSIS — E669 Obesity, unspecified: Secondary | ICD-10-CM | POA: Diagnosis not present

## 2024-01-15 DIAGNOSIS — E559 Vitamin D deficiency, unspecified: Secondary | ICD-10-CM | POA: Diagnosis not present

## 2024-01-15 DIAGNOSIS — F5089 Other specified eating disorder: Secondary | ICD-10-CM

## 2024-01-15 DIAGNOSIS — Z6841 Body Mass Index (BMI) 40.0 and over, adult: Secondary | ICD-10-CM

## 2024-01-15 MED ORDER — VITAMIN D (ERGOCALCIFEROL) 1.25 MG (50000 UNIT) PO CAPS: 50000.0000 [IU] | ORAL_CAPSULE | ORAL | 0 refills | Status: DC

## 2024-01-15 MED ORDER — TOPIRAMATE 50 MG PO TABS: 75.0000 mg | ORAL_TABLET | Freq: Every day | ORAL | 0 refills | Status: DC

## 2024-01-15 MED ORDER — LOMAIRA 8 MG PO TABS: 12.0000 mg | ORAL_TABLET | Freq: Every day | ORAL | 0 refills | Status: DC

## 2024-01-15 NOTE — Progress Notes (Signed)
 Appointment canceled.

## 2024-01-15 NOTE — Assessment & Plan Note (Signed)
 Referral to [facility] placed but patient unsure if she has been called to schedule.  Phone number of [facility] given to patient for her to follow up on status of her referral today.

## 2024-01-15 NOTE — Assessment & Plan Note (Signed)
 Starting weight of 311 on makeshift qsymia.  She is only down 1lb but has been off medication since mid February. She was on 12/75mg  dose.  She needs a refill of Phentermine/ Topiramate.  PDMP checked and no concerns.  We discussed the need to ensure total nutrition intake when on these medications.  Will restart medications and titrate back up to her prior dose.

## 2024-01-15 NOTE — Assessment & Plan Note (Signed)
 Last Vitamin D level below goal of 60-80.  Needs refill of Vitamin D today.  Will need repeat labs in 2 months.

## 2024-01-15 NOTE — Progress Notes (Signed)
 SUBJECTIVE:  Chief Complaint: Obesity  Interim History: patient has been sick most of the month of February.  Her husband took care of her over the month- got her food and prepared food close to what was on her meal plan.  Since she has been feeling better today she is planning to start walking again and is contemplating swimming. She also wants to get back to doing her aerobics.  She likes the Category meal plan.  Meredith Wright is here to discuss her progress with her obesity treatment plan. She is on the Category 4 Plan and keeping a food journal and adhering to recommended goals of 350-400 with 30 grams of protein and 450-600 calories and 40 grams of protein and states she is following her eating plan approximately 50 % of the time. She states she is walking some.   OBJECTIVE: Visit Diagnoses: Problem List Items Addressed This Visit       Other   BMI 45.0-49.9, adult (HCC)   Relevant Medications   Phentermine HCl (LOMAIRA) 8 MG TABS   Vitamin D deficiency   Last Vitamin D level below goal of 60-80.  Needs refill of Vitamin D today.  Will need repeat labs in 2 months.      Relevant Medications   Vitamin D, Ergocalciferol, (DRISDOL) 1.25 MG (50000 UNIT) CAPS capsule   Obesity with starting BMI of 49.2   Relevant Medications   Phentermine HCl (LOMAIRA) 8 MG TABS   Disorder of eating - Primary   Starting weight of 311 on makeshift qsymia.  She is only down 1lb but has been off medication since mid February. She was on 12/75mg  dose.  She needs a refill of Phentermine/ Topiramate.  PDMP checked and no concerns.  We discussed the need to ensure total nutrition intake when on these medications.  Will restart medications and titrate back up to her prior dose.      Relevant Medications   topiramate (TOPAMAX) 50 MG tablet   Phentermine HCl (LOMAIRA) 8 MG TABS   Suspected sleep apnea   [facility]erral [facility] placed but patient unsure if she has been called to schedule.  Phone number of [facility] given to  patient for her to follow up on status of her referral today.       Vitals Temp: 98.2 F (36.8 C) BP: 132/81 Pulse Rate: 76 SpO2: 97 %   Anthropometric Measurements Height: 5\' 7"  (1.702 m) Weight: (!) 310 lb (140.6 kg) BMI (Calculated): 48.54 Weight at Last Visit: 307 lb Weight Lost Since Last Visit: 0 Weight Gained Since Last Visit: 3 Starting Weight: 308 lb Total Weight Loss (lbs): 0 lb (0 kg)   Body Composition  Body Fat %: 55.4 % Fat Mass (lbs): 171.8 lbs Muscle Mass (lbs): 131.4 lbs Visceral Fat Rating : 20   Other Clinical Data Today's Visit #: 20 Starting Date: 11/30/22 Comments: Cat 4, journal     ASSESSMENT AND PLAN:  Diet: Dyanna is currently in the action stage of change. As such, her goal is to continue with weight loss efforts and has agreed to the Category 4 Plan.   Exercise:  For substantial health benefits, adults should do at least 150 minutes (2 hours and 30 minutes) a week of moderate-intensity, or 75 minutes (1 hour and 15 minutes) a week of vigorous-intensity aerobic physical activity, or an equivalent combination of moderate- and vigorous-intensity aerobic activity. Aerobic activity should be performed in episodes of at least 10 minutes, and preferably, it should be spread  throughout the week.  Behavior Modification:  We discussed the following Behavioral Modification Strategies today: increasing lean protein intake, increasing vegetables, meal planning and cooking strategies, family/coworker sabotage, avoiding temptations, and planning for success. We discussed various medication options to help Jabree with her weight loss efforts and we both agreed to continue phentermine and topiramate at current dose.  No follow-ups on file.Marland Kitchen She was informed of the importance of frequent follow up visits to maximize her success with intensive lifestyle modifications for her multiple health conditions.  Attestation Statements:   Reviewed by clinician on  day of visit: allergies, medications, problem list, medical history, surgical history, family history, social history, and previous encounter notes.     Reuben Likes, MD

## 2024-01-18 ENCOUNTER — Ambulatory Visit: Payer: 59 | Admitting: Dermatology

## 2024-01-23 LAB — COLOGUARD: COLOGUARD: NEGATIVE

## 2024-01-26 ENCOUNTER — Encounter (HOSPITAL_BASED_OUTPATIENT_CLINIC_OR_DEPARTMENT_OTHER): Payer: Self-pay | Admitting: Family Medicine

## 2024-02-01 ENCOUNTER — Ambulatory Visit (INDEPENDENT_AMBULATORY_CARE_PROVIDER_SITE_OTHER): Payer: 59 | Admitting: Family Medicine

## 2024-02-01 ENCOUNTER — Encounter (INDEPENDENT_AMBULATORY_CARE_PROVIDER_SITE_OTHER): Payer: Self-pay | Admitting: Family Medicine

## 2024-02-01 VITALS — BP 140/78 | HR 73 | Temp 97.6°F | Ht 67.0 in | Wt 306.0 lb

## 2024-02-01 DIAGNOSIS — F509 Eating disorder, unspecified: Secondary | ICD-10-CM | POA: Diagnosis not present

## 2024-02-01 DIAGNOSIS — I10 Essential (primary) hypertension: Secondary | ICD-10-CM

## 2024-02-01 DIAGNOSIS — Z6841 Body Mass Index (BMI) 40.0 and over, adult: Secondary | ICD-10-CM

## 2024-02-01 DIAGNOSIS — F5089 Other specified eating disorder: Secondary | ICD-10-CM

## 2024-02-01 DIAGNOSIS — E669 Obesity, unspecified: Secondary | ICD-10-CM | POA: Diagnosis not present

## 2024-02-01 DIAGNOSIS — E559 Vitamin D deficiency, unspecified: Secondary | ICD-10-CM

## 2024-02-01 MED ORDER — VITAMIN D (ERGOCALCIFEROL) 1.25 MG (50000 UNIT) PO CAPS
50000.0000 [IU] | ORAL_CAPSULE | ORAL | 0 refills | Status: DC
Start: 2024-02-01 — End: 2024-03-28

## 2024-02-01 MED ORDER — LOMAIRA 8 MG PO TABS: 12.0000 mg | ORAL_TABLET | Freq: Every day | ORAL | 0 refills | Status: DC

## 2024-02-01 MED ORDER — TOPIRAMATE 50 MG PO TABS: 75.0000 mg | ORAL_TABLET | Freq: Every day | ORAL | 0 refills | Status: DC

## 2024-02-01 NOTE — Progress Notes (Signed)
 SUBJECTIVE:  Chief Complaint: Obesity  Interim History: Patient celebrated her birthday at [employer].  She ate over her protein intake for the day that day. She has not been thinking much of her food intake because she has been taking care of her husband.  She is no longer eating eggs because they are too expensive.  She is not hitting her calories and protein due to that.   Meredith Wright is here to discuss her progress with her obesity treatment plan. She is on the Category 4 Plan and states she is following her eating plan approximately 25 % of the time. She states she is exercising walking inside.   OBJECTIVE: Visit Diagnoses: Problem List Items Addressed This Visit       Cardiovascular and Mediastinum   Essential hypertension   Blood pressure minimally elevated today.  She is on norvasc and diovan daily.  Will continue current meds at same doses. No refills needed today.        Other   Vitamin D deficiency   Patient on Rx Vitamin D.  Needs refill today- improving fatigue.      Relevant Medications   Vitamin D, Ergocalciferol, (DRISDOL) 1.25 MG (50000 UNIT) CAPS capsule   Obesity with starting BMI of 49.2   Patient trying to stay focused on increasing total protein intake and increasing total calories. Will follow up at next appointment to see if she is able to increase total intake.      Relevant Medications   Phentermine HCl (LOMAIRA) 8 MG TABS   Disorder of eating - Primary   Starting weight of 311 on makeshift qsymia.  She is now down 5lb on 12/75mg  dose.  She needs a refill of Phentermine/ Topiramate.  PDMP checked and no concerns.  Continue current medications at same dose and reassess medication titration at next appointment.       Relevant Medications   topiramate (TOPAMAX) 50 MG tablet   Phentermine HCl (LOMAIRA) 8 MG TABS    Vitals Temp: 97.6 F (36.4 C) BP: (!) 140/78 Pulse Rate: 73 SpO2: 98 %   Anthropometric Measurements Height: 5\' 7"  (1.702  m) Weight: (!) 306 lb (138.8 kg) BMI (Calculated): 47.92 Weight at Last Visit: 310 lb Weight Lost Since Last Visit: 4 Weight Gained Since Last Visit: 0 Starting Weight: 308 lb Total Weight Loss (lbs): 2 lb (0.907 kg)   Body Composition  Body Fat %: 53.8 % Fat Mass (lbs): 164.8 lbs Muscle Mass (lbs): 134.4 lbs Total Body Water (lbs): 105 lbs Visceral Fat Rating : 19   Other Clinical Data Today's Visit #: 21 Starting Date: 11/30/22 Comments: Cat 4     ASSESSMENT AND PLAN:  Diet: Marily is currently in the action stage of change. As such, her goal is to continue with weight loss efforts and has agreed to practicing portion control and making smarter food choices, such as increasing vegetables and decreasing simple carbohydrates.  Patient to refocus on increasing breakfast closer to when she was eating her eggs and [place] sausage.  Exercise:  All adults should avoid inactivity. Some activity is better than none, and adults who participate in any amount of physical activity, gain some health benefits.  Behavior Modification:  We discussed the following Behavioral Modification Strategies today: increasing lean protein intake, increasing vegetables, meal planning and cooking strategies, avoiding temptations, and planning for success. We discussed various medication options to help [name] with her weight loss efforts and we both agreed to continue phentermine and topiramate at  current doses.  Return in about 4 weeks (around 02/29/2024).Marland Kitchen She was informed of the importance of frequent follow up visits to maximize her success with intensive lifestyle modifications for her multiple health conditions.  Attestation Statements:   Reviewed by clinician on day of visit: allergies, medications, problem list, medical history, surgical history, family history, social history, and previous encounter notes.    Reuben Likes, MD

## 2024-02-01 NOTE — Assessment & Plan Note (Signed)
 Patient trying to stay focused on increasing total protein intake and increasing total calories. Will follow up at next appointment to see if she is able to increase total intake.

## 2024-02-01 NOTE — Assessment & Plan Note (Signed)
 Patient on Rx Vitamin D.  Needs refill today- improving fatigue.

## 2024-02-01 NOTE — Assessment & Plan Note (Signed)
 Starting weight of 311 on makeshift qsymia.  She is now down 5lb on 12/75mg  dose.  She needs a refill of Phentermine/ Topiramate.  PDMP checked and no concerns.  Continue current medications at same dose and reassess medication titration at next appointment.

## 2024-02-01 NOTE — Assessment & Plan Note (Signed)
 Blood pressure minimally elevated today.  She is on norvasc and diovan daily.  Will continue current meds at same doses. No refills needed today.

## 2024-02-05 ENCOUNTER — Other Ambulatory Visit (INDEPENDENT_AMBULATORY_CARE_PROVIDER_SITE_OTHER): Payer: Self-pay | Admitting: Family Medicine

## 2024-02-05 DIAGNOSIS — E559 Vitamin D deficiency, unspecified: Secondary | ICD-10-CM

## 2024-02-29 ENCOUNTER — Encounter (INDEPENDENT_AMBULATORY_CARE_PROVIDER_SITE_OTHER): Payer: Self-pay | Admitting: Family Medicine

## 2024-02-29 ENCOUNTER — Ambulatory Visit (INDEPENDENT_AMBULATORY_CARE_PROVIDER_SITE_OTHER): Admitting: Family Medicine

## 2024-02-29 VITALS — BP 140/84 | HR 82 | Temp 98.2°F | Ht 67.0 in | Wt 302.0 lb

## 2024-02-29 DIAGNOSIS — F5089 Other specified eating disorder: Secondary | ICD-10-CM

## 2024-02-29 DIAGNOSIS — Z6841 Body Mass Index (BMI) 40.0 and over, adult: Secondary | ICD-10-CM

## 2024-02-29 DIAGNOSIS — I10 Essential (primary) hypertension: Secondary | ICD-10-CM

## 2024-02-29 DIAGNOSIS — E669 Obesity, unspecified: Secondary | ICD-10-CM | POA: Diagnosis not present

## 2024-02-29 MED ORDER — PHENTERMINE HCL 15 MG PO CAPS: 15.0000 mg | ORAL_CAPSULE | Freq: Every day | ORAL | 0 refills | Status: DC

## 2024-02-29 MED ORDER — TOPIRAMATE 100 MG PO TABS: 100.0000 mg | ORAL_TABLET | Freq: Every day | ORAL | 0 refills | Status: DC

## 2024-02-29 NOTE — Assessment & Plan Note (Signed)
 BP borderline today.  No chest pain, chest pressure or headache.  On norvasc  and valsartan .  No change in meds or doses.  Follow up BP at next appointment.

## 2024-02-29 NOTE — Progress Notes (Unsigned)
   SUBJECTIVE:  Chief Complaint: Obesity  Interim History: Since last appointment patient has been doing more physical activity and moving more weight.  She has been following the plan 100% of the time.  She did get the cereal that we discussed last appointment.  She would like her eggs back.  She also got the protein pretzels that we discussed and doesn't like all of the flavors. Has been getting all the protein in daily that is her goal.   Siyana is here to discuss her progress with her obesity treatment plan. She is on the practicing portion control and making smarter food choices, such as increasing vegetables and decreasing simple carbohydrates and states she is following her eating plan approximately 100 % of the time. She states she is exercising 30 minutes 7 times per week.   OBJECTIVE: Visit Diagnoses: Problem List Items Addressed This Visit   None   Vitals Temp: 98.2 F (36.8 C) BP: (!) 140/84 Pulse Rate: 82 SpO2: 97 %   Anthropometric Measurements Height: 5\' 7"  (1.702 m) Weight: (!) 302 lb (137 kg) BMI (Calculated): 47.29 Weight at Last Visit: 306 lb Weight Lost Since Last Visit: 4 Weight Gained Since Last Visit: 0 Starting Weight: 308 lb Total Weight Loss (lbs): 6 lb (2.722 kg)   Body Composition  Body Fat %: 52.9 % Fat Mass (lbs): 160.2 lbs Muscle Mass (lbs): 135.4 lbs Total Body Water (lbs): 105 lbs Visceral Fat Rating : 19   Other Clinical Data Today's Visit #: 22 Starting Date: 11/30/22 Comments: Cat 4     ASSESSMENT AND PLAN:  Diet: Quanita is currently in the action stage of change. As such, her goal is to continue with weight loss efforts and has agreed to the Category 4 Plan. She is wondering if she can switch up some food choices- feels her body is getting used to eating as is and wants to change certain food.   Exercise:  For substantial health benefits, adults should do at least 150 minutes (2 hours and 30 minutes) a week of  moderate-intensity, or 75 minutes (1 hour and 15 minutes) a week of vigorous-intensity aerobic physical activity, or an equivalent combination of moderate- and vigorous-intensity aerobic activity. Aerobic activity should be performed in episodes of at least 10 minutes, and preferably, it should be spread throughout the week. She is walking with her husband frequently.  Behavior Modification:  We discussed the following Behavioral Modification Strategies today: increasing lean protein intake, no skipping meals, meal planning and cooking strategies, and travel eating strategies. We discussed various medication options to help Libra with her weight loss efforts and we both agreed to continue lomaira  and topiramate  but increase to max dose.  No follow-ups on file.Aaron Aas She was informed of the importance of frequent follow up visits to maximize her success with intensive lifestyle modifications for her multiple health conditions.  Attestation Statements:   Reviewed by clinician on day of visit: allergies, medications, problem list, medical history, surgical history, family history, social history, and previous encounter notes.     Donaciano Frizzle, MD

## 2024-03-01 ENCOUNTER — Other Ambulatory Visit (INDEPENDENT_AMBULATORY_CARE_PROVIDER_SITE_OTHER): Payer: Self-pay | Admitting: Family Medicine

## 2024-03-01 DIAGNOSIS — E559 Vitamin D deficiency, unspecified: Secondary | ICD-10-CM

## 2024-03-01 NOTE — Assessment & Plan Note (Signed)
 Patient doing well on max dose of phentermine  and topiramate .Starting weight of 311 on makeshift qsymia.  She is now down 9lb on 12/75mg  dose.  She needs a refill of Phentermine / Topiramate .  PDMP checked and no concerns.  Refills of the medications sent in.

## 2024-03-14 ENCOUNTER — Other Ambulatory Visit (INDEPENDENT_AMBULATORY_CARE_PROVIDER_SITE_OTHER): Payer: Self-pay | Admitting: Family Medicine

## 2024-03-14 DIAGNOSIS — E7849 Other hyperlipidemia: Secondary | ICD-10-CM

## 2024-03-18 ENCOUNTER — Other Ambulatory Visit (INDEPENDENT_AMBULATORY_CARE_PROVIDER_SITE_OTHER): Payer: Self-pay | Admitting: Family Medicine

## 2024-03-18 DIAGNOSIS — E559 Vitamin D deficiency, unspecified: Secondary | ICD-10-CM

## 2024-03-28 ENCOUNTER — Ambulatory Visit (INDEPENDENT_AMBULATORY_CARE_PROVIDER_SITE_OTHER): Admitting: Family Medicine

## 2024-03-28 ENCOUNTER — Encounter (INDEPENDENT_AMBULATORY_CARE_PROVIDER_SITE_OTHER): Payer: Self-pay | Admitting: Family Medicine

## 2024-03-28 VITALS — BP 130/78 | HR 64 | Temp 98.3°F | Ht 67.0 in | Wt 301.0 lb

## 2024-03-28 DIAGNOSIS — R632 Polyphagia: Secondary | ICD-10-CM | POA: Insufficient documentation

## 2024-03-28 DIAGNOSIS — E669 Obesity, unspecified: Secondary | ICD-10-CM

## 2024-03-28 DIAGNOSIS — Z6841 Body Mass Index (BMI) 40.0 and over, adult: Secondary | ICD-10-CM

## 2024-03-28 DIAGNOSIS — I10 Essential (primary) hypertension: Secondary | ICD-10-CM

## 2024-03-28 DIAGNOSIS — F5089 Other specified eating disorder: Secondary | ICD-10-CM | POA: Diagnosis not present

## 2024-03-28 MED ORDER — PHENTERMINE HCL 15 MG PO CAPS: 15.0000 mg | ORAL_CAPSULE | Freq: Every day | ORAL | 0 refills | Status: DC

## 2024-03-28 MED ORDER — TOPIRAMATE 100 MG PO TABS
100.0000 mg | ORAL_TABLET | Freq: Every day | ORAL | 0 refills | Status: DC
Start: 2024-03-28 — End: 2024-05-07

## 2024-03-28 NOTE — Assessment & Plan Note (Signed)
 On combination therapy with diovan  and norvasc .  Needs no refills today.  Continue current medication doses as BP controlled today.  No chest pain, chest pressure or headache.

## 2024-03-28 NOTE — Assessment & Plan Note (Signed)
 Patient doing well on max dose of phentermine  and topiramate .Starting weight of 311 on makeshift qsymia.  She is now down 10lb on 12/75mg  dose.  She needs a refill of Phentermine / Topiramate .  PDMP checked and no concerns.  She was increased to max dose last appointment.  She needs to lose an additional 6lbs to be down the 5% loss.  Will refill and re-evaluate treatment at next appointment.

## 2024-03-28 NOTE — Progress Notes (Signed)
 SUBJECTIVE:  Chief Complaint: Obesity  Interim History: Patient leaving today for [place] vacation.  She will be gone for the long weekend.  She has been doing well focusing on intake and limiting simple carbohydrates.  She did have a slightly indulgent intake over the weekend with a brownie from her sister.    Meredith Wright is here to discuss her progress with her obesity treatment plan. She is on the Category 4 Plan and states she is following her eating plan approximately 100 % of the time. She states she is walking 45 minutes 7 times per week.   OBJECTIVE: Visit Diagnoses: Problem List Items Addressed This Visit       Cardiovascular and Mediastinum   Essential hypertension   On combination therapy with diovan  and norvasc .  Needs no refills today.  Continue current medication doses as BP controlled today.  No chest pain, chest pressure or headache.        Other   BMI 45.0-49.9, adult (HCC)   Relevant Medications   phentermine  15 MG capsule   Obesity with starting BMI of 49.2   Relevant Medications   phentermine  15 MG capsule   Disorder of eating - Primary   Patient doing well on max dose of phentermine  and topiramate .Starting weight of 311 on makeshift qsymia.  She is now down 10lb on 12/75mg  dose.  She needs a refill of Phentermine / Topiramate .  PDMP checked and no concerns.  She was increased to max dose last appointment.  She needs to lose an additional 6lbs to be down the 5% loss.  Will refill and re-evaluate treatment at next appointment.      Relevant Medications   topiramate  (TOPAMAX ) 100 MG tablet   phentermine  15 MG capsule    Vitals Temp: 98.3 F (36.8 C) BP: 130/78 Pulse Rate: 64 SpO2: 98 %   Anthropometric Measurements Height: 5\' 7"  (1.702 m) Weight: (!) 301 lb (136.5 kg) BMI (Calculated): 47.13 Weight at Last Visit: 302 lb Weight Lost Since Last Visit: 1 Weight Gained Since Last Visit: 0 Starting Weight: 308 lb Total Weight Loss (lbs): 7 lb (3.175  kg)   Body Composition  Body Fat %: 53.2 % Fat Mass (lbs): 160.2 lbs Muscle Mass (lbs): 134 lbs Total Body Water (lbs): 104.6 lbs Visceral Fat Rating : 19   Other Clinical Data Today's Visit #: 23 Starting Date: 11/30/22 Comments: Cat 4     ASSESSMENT AND PLAN:  Diet: Dallys is currently in the action stage of change. As such, her goal is to continue with weight loss efforts and has agreed to the Category 4 Plan 1800 calories and 125 or more grams of protein daily. Patient to start food log or journaling meal plan.  The initial goal will be to habitually log or journal for at least 4 days a week.  The expectation it that patient may not initially meet calorie or protein goals as the nutritional understanding of food intake is begun.  We discussed the 10:1 ratio when reading a food label.  Patient agrees to keep a food log either electronically or on paper and bring to the next appointment to be able to dissect and discuss it with provider.    Exercise:  For substantial health benefits, adults should do at least 150 minutes (2 hours and 30 minutes) a week of moderate-intensity, or 75 minutes (1 hour and 15 minutes) a week of vigorous-intensity aerobic physical activity, or an equivalent combination of moderate- and vigorous-intensity aerobic activity. Aerobic activity should  be performed in episodes of at least 10 minutes, and preferably, it should be spread throughout the week.  She is continuing walking with her husband and will start to implement some weight training at home over the next few weeks.  Behavior Modification:  We discussed the following Behavioral Modification Strategies today: increasing lean protein intake, decreasing simple carbohydrates, increasing vegetables, meal planning and cooking strategies, and keeping healthy foods in the home. We discussed various medication options to help Hanin with her weight loss efforts and we both agreed to continue phentermine  and  topiramate  at current dose.  Return in about 4 weeks (around 04/25/2024).   She was informed of the importance of frequent follow up visits to maximize her success with intensive lifestyle modifications for her multiple health conditions.  Attestation Statements:   Reviewed by clinician on day of visit: allergies, medications, problem list, medical history, surgical history, family history, social history, and previous encounter notes.     Donaciano Frizzle, MD

## 2024-04-04 ENCOUNTER — Other Ambulatory Visit (HOSPITAL_BASED_OUTPATIENT_CLINIC_OR_DEPARTMENT_OTHER): Payer: Self-pay | Admitting: Family Medicine

## 2024-04-04 DIAGNOSIS — I10 Essential (primary) hypertension: Secondary | ICD-10-CM

## 2024-05-07 ENCOUNTER — Encounter (INDEPENDENT_AMBULATORY_CARE_PROVIDER_SITE_OTHER): Payer: Self-pay | Admitting: Family Medicine

## 2024-05-07 ENCOUNTER — Ambulatory Visit (INDEPENDENT_AMBULATORY_CARE_PROVIDER_SITE_OTHER): Admitting: Family Medicine

## 2024-05-07 VITALS — BP 132/76 | HR 63 | Temp 97.9°F | Ht 67.0 in | Wt 295.0 lb

## 2024-05-07 DIAGNOSIS — F5089 Other specified eating disorder: Secondary | ICD-10-CM | POA: Diagnosis not present

## 2024-05-07 DIAGNOSIS — I10 Essential (primary) hypertension: Secondary | ICD-10-CM

## 2024-05-07 DIAGNOSIS — E7849 Other hyperlipidemia: Secondary | ICD-10-CM | POA: Diagnosis not present

## 2024-05-07 DIAGNOSIS — Z6841 Body Mass Index (BMI) 40.0 and over, adult: Secondary | ICD-10-CM

## 2024-05-07 DIAGNOSIS — E669 Obesity, unspecified: Secondary | ICD-10-CM

## 2024-05-07 MED ORDER — TOPIRAMATE 100 MG PO TABS
100.0000 mg | ORAL_TABLET | Freq: Every day | ORAL | 0 refills | Status: DC
Start: 2024-05-07 — End: 2024-06-11

## 2024-05-07 MED ORDER — AMLODIPINE BESYLATE 5 MG PO TABS
5.0000 mg | ORAL_TABLET | Freq: Every day | ORAL | 1 refills | Status: DC
Start: 2024-05-07 — End: 2024-09-11

## 2024-05-07 MED ORDER — PHENTERMINE HCL 15 MG PO CAPS
15.0000 mg | ORAL_CAPSULE | Freq: Every day | ORAL | 0 refills | Status: DC
Start: 2024-05-07 — End: 2024-06-11

## 2024-05-07 MED ORDER — QC CALCIUM 500MG-D3 500-5 MG-MCG PO TABS: 1.0000 | ORAL_TABLET | Freq: Every day | ORAL | Status: AC

## 2024-05-07 MED ORDER — ATORVASTATIN CALCIUM 10 MG PO TABS
10.0000 mg | ORAL_TABLET | Freq: Every day | ORAL | 0 refills | Status: DC
Start: 2024-05-07 — End: 2024-09-11

## 2024-05-07 NOTE — Progress Notes (Signed)
 SUBJECTIVE:  Chief Complaint: Obesity  Interim History: Patient here for follow up. Meredith Wright is doing fairly well overall.  [facility] was excellent and her whole month of June was excellent.  No upcoming plans for July 4th.  Meredith Wright and her husband have been walking quite a bit.  Meredith Wright has been eating her cereal ad eating her dinner daily- example would be salmon dinner with no bread.  Meredith Wright is only eating breakfast and dinner and often skipping lunch. Not anticipating any upcoming obstacle.  Meredith Wright is here to discuss her progress with her obesity treatment plan. Meredith Wright is on the Category 4 Plan and states Meredith Wright is following her eating plan approximately 60 % of the time. Meredith Wright states Meredith Wright is walking 60-90 minutes 6-7 times per week in [place].  Meredith Wright is walking about 30 minutes at a time.   OBJECTIVE: Visit Diagnoses: Problem List Items Addressed This Visit       Cardiovascular and Mediastinum   Primary hypertension   Relevant Medications   amLODipine  (NORVASC ) 5 MG tablet   atorvastatin  (LIPITOR) 10 MG tablet     Other   BMI 45.0-49.9, adult (HCC)   Relevant Medications   phentermine  15 MG capsule   Other hyperlipidemia   Relevant Medications   amLODipine  (NORVASC ) 5 MG tablet   atorvastatin  (LIPITOR) 10 MG tablet   Obesity with starting BMI of 49.2   Relevant Medications   phentermine  15 MG capsule   Disorder of eating - Primary   Relevant Medications   phentermine  15 MG capsule   topiramate  (TOPAMAX ) 100 MG tablet    No data recorded  No data recorded  No data recorded  No data recorded    ASSESSMENT AND PLAN: Assessment & Plan Other disorder of eating Patient doing well on max dose of phentermine  and topiramate .Starting weight of 311 on makeshift qsymia.  Meredith Wright is now down 13lb on 12/75mg  dose.  Meredith Wright needs a refill of Phentermine / Topiramate .  PDMP checked and no concerns.  Meredith Wright was increased to max dose last appointment.  Meredith Wright needs to lose an additional 2lbs to be down the 5% loss.  Will  refill and re-evaluate treatment at next appointment. Primary hypertension BP controlled toay.  Patient denies chest pain, chest pressure, headache.  Continue current meds at same doses- needs refill of amlodipine  today. Other hyperlipidemia Patient on lipitor daily.  Meredith Wright is denies any myalgias.  Needs refill of lipitor today.  3 month prescription sent in.   Obesity with starting BMI of 49.2  BMI 45.0-49.9, adult (HCC)    Diet: Meredith Wright is currently in the action stage of change. As such, her goal is to continue with weight loss efforts and has agreed to the Category 4 Plan.   Exercise:  For substantial health benefits, adults should do at least 150 minutes (2 hours and 30 minutes) a week of moderate-intensity, or 75 minutes (1 hour and 15 minutes) a week of vigorous-intensity aerobic physical activity, or an equivalent combination of moderate- and vigorous-intensity aerobic activity. Aerobic activity should be performed in episodes of at least 10 minutes, and preferably, it should be spread throughout the week.  Behavior Modification:  We discussed the following Behavioral Modification Strategies today: increasing lean protein intake, decreasing simple carbohydrates, increasing vegetables, meal planning and cooking strategies, avoiding temptations, and planning for success. We discussed various medication options to help Meredith Wright with her weight loss efforts and we both agreed to continue phentermine  and topiramate  at current doses.  Return in about  4 weeks (around 06/04/2024).   Meredith Wright was informed of the importance of frequent follow up visits to maximize her success with intensive lifestyle modifications for her multiple health conditions.  Attestation Statements:   Reviewed by clinician on day of visit: allergies, medications, problem list, medical history, surgical history, family history, social history, and previous encounter notes.     Meredith Wright Cho, MD

## 2024-05-13 ENCOUNTER — Ambulatory Visit (INDEPENDENT_AMBULATORY_CARE_PROVIDER_SITE_OTHER): Admitting: Family Medicine

## 2024-05-13 VITALS — BP 136/84 | HR 73 | Ht 66.5 in | Wt 298.0 lb

## 2024-05-13 DIAGNOSIS — Z6841 Body Mass Index (BMI) 40.0 and over, adult: Secondary | ICD-10-CM

## 2024-05-13 DIAGNOSIS — I10 Essential (primary) hypertension: Secondary | ICD-10-CM

## 2024-05-13 NOTE — Progress Notes (Signed)
    Procedures performed today:    None.  Independent interpretation of notes and tests performed by another provider:   None.  Brief History, Exam, Impression, and Recommendations:    BP 136/84 (BP Location: Right Arm, Patient Position: Sitting, Cuff Size: Large)   Pulse 73   Ht 5' 6.5 (1.689 m)   Wt 298 lb (135.2 kg)   SpO2 95%   BMI 47.38 kg/m   Primary hypertension Assessment & Plan: Blood pressure appropriate in office today.  She continues with amlodipine  and valsartan , denies any concerns with medications today.  Not checking blood pressure regularly at home, thinks she misplaced her blood pressure cuff.  No current issues with chest pain or headaches. Can continue with current medication regimen, no changes made today.  Recommend intermittent monitoring of blood pressure at home, DASH diet.   BMI 45.0-49.9, adult [facility]) Assessment & Plan: Patient continues to work with healthy weight and wellness clinic.  She reports that things are going well, her husband does help to motivate her to stay on task.  She has had about 14 pound weight loss since last visit with me a few months ago. Encouraged to continue with positive changes and close follow-up with healthy weight and wellness clinic.   Return in about 6 months (around 11/13/2024) for hypertension, prediabetes.   ___________________________________________ Shai Mckenzie de [place], MD, ABFM, CAQSM Primary Care and Sports [facility]

## 2024-05-13 NOTE — Assessment & Plan Note (Signed)
 Patient continues to work with healthy weight and wellness clinic.  She reports that things are going well, her husband does help to motivate her to stay on task.  She has had about 14 pound weight loss since last visit with me a few months ago. Encouraged to continue with positive changes and close follow-up with healthy weight and wellness clinic.

## 2024-05-13 NOTE — Patient Instructions (Signed)

## 2024-05-13 NOTE — Assessment & Plan Note (Signed)
 Blood pressure appropriate in office today.  She continues with amlodipine  and valsartan , denies any concerns with medications today.  Not checking blood pressure regularly at home, thinks she misplaced her blood pressure cuff.  No current issues with chest pain or headaches. Can continue with current medication regimen, no changes made today.  Recommend intermittent monitoring of blood pressure at home, DASH diet.

## 2024-05-18 NOTE — Assessment & Plan Note (Signed)
 BP controlled toay.  Patient denies chest pain, chest pressure, headache.  Continue current meds at same doses- needs refill of amlodipine  today.

## 2024-05-18 NOTE — Assessment & Plan Note (Signed)
 Patient doing well on max dose of phentermine  and topiramate .Starting weight of 311 on makeshift qsymia.  She is now down 13lb on 12/75mg  dose.  She needs a refill of Phentermine / Topiramate .  PDMP checked and no concerns.  She was increased to max dose last appointment.  She needs to lose an additional 2lbs to be down the 5% loss.  Will refill and re-evaluate treatment at next appointment.

## 2024-05-18 NOTE — Assessment & Plan Note (Signed)
 Patient on lipitor daily.  She is denies any myalgias.  Needs refill of lipitor today.  3 month prescription sent in.

## 2024-06-07 ENCOUNTER — Other Ambulatory Visit (INDEPENDENT_AMBULATORY_CARE_PROVIDER_SITE_OTHER): Payer: Self-pay | Admitting: Family Medicine

## 2024-06-07 DIAGNOSIS — F5089 Other specified eating disorder: Secondary | ICD-10-CM

## 2024-06-11 ENCOUNTER — Encounter (INDEPENDENT_AMBULATORY_CARE_PROVIDER_SITE_OTHER): Payer: Self-pay | Admitting: Family Medicine

## 2024-06-11 ENCOUNTER — Ambulatory Visit (INDEPENDENT_AMBULATORY_CARE_PROVIDER_SITE_OTHER): Admitting: Family Medicine

## 2024-06-11 DIAGNOSIS — F5089 Other specified eating disorder: Secondary | ICD-10-CM

## 2024-06-11 DIAGNOSIS — E669 Obesity, unspecified: Secondary | ICD-10-CM

## 2024-06-11 DIAGNOSIS — Z6841 Body Mass Index (BMI) 40.0 and over, adult: Secondary | ICD-10-CM

## 2024-06-11 MED ORDER — TOPIRAMATE 100 MG PO TABS: 100.0000 mg | ORAL_TABLET | Freq: Every day | ORAL | 0 refills | Status: DC

## 2024-06-11 MED ORDER — PHENTERMINE HCL 15 MG PO CAPS
15.0000 mg | ORAL_CAPSULE | Freq: Every day | ORAL | 0 refills | Status: DC
Start: 2024-06-11 — End: 2024-07-11

## 2024-06-11 NOTE — Progress Notes (Unsigned)
 SUBJECTIVE:  Chief Complaint: Obesity  Interim History: Patient has been eating breakfast and dinner and mentions that is the only time she is able to be at home.  When she is not home she has been taking care of her daughter's place in [place].  She is walking frequently with her husband.  She is often missing lunch.  She is supposed to go tomorrow to get her treatment and has been feeling mor poorly recently.  Over the next few weeks she is planning to go to her daughters in two days to watch her place and then her husband goes back to work.  She is logging and after reviewing log appears she is averaging higher 1700 to 2000 calories daily and about 110g of protein.  Meredith Wright is here to discuss her progress with her obesity treatment plan. She is on the Category 4 Plan and states she is following her eating plan approximately 50 % of the time. She states she is walking all the time.   OBJECTIVE: Visit Diagnoses: Problem List Items Addressed This Visit       Other   Disorder of eating   Relevant Medications   phentermine  15 MG capsule   topiramate  (TOPAMAX ) 100 MG tablet    Vitals Temp: (!) 97 F (36.1 C) BP: 120/78 Pulse Rate: 69   Anthropometric Measurements Height: 5' 7 (1.702 m) Weight: 296 lb (134.3 kg) BMI (Calculated): 46.35 Weight at Last Visit: 295 lb Weight Lost Since Last Visit: 0 Weight Gained Since Last Visit: 1 Starting Weight: 308 lb Total Weight Loss (lbs): 12 lb (5.443 kg)   Body Composition  Body Fat %: 52.8 % Fat Mass (lbs): 156.4 lbs Muscle Mass (lbs): 132.8 lbs Total Body Water (lbs): 103.4 lbs Visceral Fat Rating : 18   Other Clinical Data Today's Visit #: 25 Starting Date: 11/30/22 Comments: Cat 4     ASSESSMENT AND PLAN:  Diet: Meredith Wright is currently in the action stage of change. As such, her goal is to continue with weight loss efforts and has agreed to keeping a food journal and adhering to recommended goals of 1800 calories and 150 or  more grams of protein daily.  Patient to start food log or journaling meal plan.  The initial goal will be to habitually log or journal for at least 4 days a week.  The expectation it that patient may not initially meet calorie or protein goals as the nturitional understanding of food intake is begun.  We discussed the 10:1 ratio when reading a food label.  Patient agrees to keep a food log either electronically or on paper and bring to the next appointment to be able to dissect and discuss it with provider.     Exercise:  All adults should avoid inactivity. Some activity is better than none, and adults who participate in any amount of physical activity, gain some health benefits.  Behavior Modification:  We discussed the following Behavioral Modification Strategies today: increasing lean protein intake, decreasing simple carbohydrates, increasing vegetables, meal planning and cooking strategies, keeping healthy foods in the home, and avoiding temptations. We discussed various medication options to help Meredith Wright with her weight loss efforts and we both agreed to patient to continue current medication at same dose as currently.  Return in about 4 weeks (around 07/09/2024).   She was informed of the importance of frequent follow up visits to maximize her success with intensive lifestyle modifications for her multiple health conditions.  Attestation Statements:   Reviewed  by clinician on day of visit: allergies, medications, problem list, medical history, surgical history, family history, social history, and previous encounter notes.     Meredith Cho, MD

## 2024-06-12 NOTE — Assessment & Plan Note (Signed)
 Patient tolerating max dose makeshift qsymia.Starting weight of 311 on makeshift qsymia.  She is now down 15lb on 12/75mg  dose.  She needs a refill of Phentermine / Topiramate .  PDMP checked and no concerns.  She is 1 pounds short of needing 5% weight loss goal.  She previously achieved this.  Will refill prescriptions and follow-up at next appointment to ensure continuation of weight loss on medication.  Refill sent to pharmacy.

## 2024-07-11 ENCOUNTER — Ambulatory Visit (INDEPENDENT_AMBULATORY_CARE_PROVIDER_SITE_OTHER): Admitting: Family Medicine

## 2024-07-11 ENCOUNTER — Encounter (INDEPENDENT_AMBULATORY_CARE_PROVIDER_SITE_OTHER): Payer: Self-pay | Admitting: Family Medicine

## 2024-07-11 VITALS — BP 108/76 | HR 83 | Temp 98.4°F | Ht 67.0 in | Wt 291.0 lb

## 2024-07-11 DIAGNOSIS — I10 Essential (primary) hypertension: Secondary | ICD-10-CM

## 2024-07-11 DIAGNOSIS — E669 Obesity, unspecified: Secondary | ICD-10-CM

## 2024-07-11 DIAGNOSIS — F5089 Other specified eating disorder: Secondary | ICD-10-CM | POA: Diagnosis not present

## 2024-07-11 DIAGNOSIS — Z6841 Body Mass Index (BMI) 40.0 and over, adult: Secondary | ICD-10-CM

## 2024-07-11 MED ORDER — PHENTERMINE HCL 15 MG PO CAPS
15.0000 mg | ORAL_CAPSULE | Freq: Every day | ORAL | 0 refills | Status: DC
Start: 2024-07-11 — End: 2024-09-11

## 2024-07-11 MED ORDER — TOPIRAMATE 100 MG PO TABS: 100.0000 mg | ORAL_TABLET | Freq: Every day | ORAL | 0 refills | Status: DC

## 2024-07-11 NOTE — Progress Notes (Unsigned)
 SUBJECTIVE:  Chief Complaint: Obesity  Interim History: Patient was just sent by her [place] doctor to see a different doctor who prescribed nervive for nerve pain.  Patient is asking what dosage she should be taking.  Intake wise she has been about 50% on plan.  Over the summer the only check she and her family have is her disability.  She voices that her husband is back at work now so now she can get more food aligned with the nutrition she needs.  She has been working out more consistently. Food journal reviewed, average calorie intake of: 1226, 1425, 1764, 1783.  Average protein intake daily over last 4 weeks: 156, 164, 111, 93.  Meredith Wright is here to discuss her progress with her obesity treatment plan. She is on the keeping a food journal and adhering to recommended goals of 1800 calories and 150 grams of protein and states she is following her eating plan approximately 50 % of the time. She states she is exercising 10 minutes 3 times per week- new workout regimen.   OBJECTIVE: Visit Diagnoses: Problem List Items Addressed This Visit       Other   Disorder of eating   Relevant Medications   topiramate  (TOPAMAX ) 100 MG tablet   phentermine  15 MG capsule    Vitals Temp: 98.4 F (36.9 C) BP: 108/76 Pulse Rate: 83 SpO2: 98 %   Anthropometric Measurements Height: 5' 7 (1.702 m) Weight: 291 lb (132 kg) BMI (Calculated): 45.57 Weight at Last Visit: 296 lb Weight Lost Since Last Visit: 5 Weight Gained Since Last Visit: 0 Starting Weight: 308 lb Total Weight Loss (lbs): 17 lb (7.711 kg)   Body Composition  Body Fat %: 52.3 % Fat Mass (lbs): 152.6 lbs Muscle Mass (lbs): 132.2 lbs Total Body Water (lbs): 101.4 lbs Visceral Fat Rating : 18   Other Clinical Data Today's Visit #: 26 Starting Date: 11/30/22 Comments: 1800/150     ASSESSMENT AND PLAN: Assessment & Plan Other disorder of eating Starting weight of 311 on makeshift dose of qsymia; current weight of 291.  She  needs a refill of phentermine  and topiramate  today.  No side effects of medications mentioned today.  PDMP checked with no concerns noted.  Will continue current doses of medications. Primary hypertension BP well controlled today with no symptoms such as headache, dizziness or lightheadedness.  Continue current treatment with no changes.  Follow up on BP at next appointment. Obesity with starting BMI of 49.2  BMI 45.0-49.9, adult (HCC)    Diet: Meredith Wright is currently in the action stage of change. As such, her goal is to continue with weight loss efforts and has agreed to keeping a food journal and adhering to recommended goals of 1800 calories and 150 or more grams of protein. Patient to start food log or journaling meal plan.  The initial goal will be to habitually log or journal for at least 4 days a week.  The expectation it that patient may not initially meet calorie or protein goals as the nutritional understanding of food intake is begun.  We discussed the 10:1 ratio when reading a food label.  Patient agrees to keep a food log either electronically or on paper and bring to the next appointment to be able to dissect and discuss it with provider.    Exercise:  For substantial health benefits, adults should do at least 150 minutes (2 hours and 30 minutes) a week of moderate-intensity, or 75 minutes (1 hour and 15  minutes) a week of vigorous-intensity aerobic physical activity, or an equivalent combination of moderate- and vigorous-intensity aerobic activity. Aerobic activity should be performed in episodes of at least 10 minutes, and preferably, it should be spread throughout the week.  Behavior Modification:  We discussed the following Behavioral Modification Strategies today: increasing lean protein intake, decreasing simple carbohydrates, increasing vegetables, meal planning and cooking strategies, keeping healthy foods in the home, planning for success, and keep a strict food journal. We  discussed various medication options to help Meredith Wright with her weight loss efforts and we both agreed to continue phentermine  and topiramate  at current doses.  Return in about 4 weeks (around 08/08/2024).   She was informed of the importance of frequent follow up visits to maximize her success with intensive lifestyle modifications for her multiple health conditions.  Attestation Statements:   Reviewed by clinician on day of visit: allergies, medications, problem list, medical history, surgical history, family history, social history, and previous encounter notes.     Adelita Cho, MD

## 2024-07-12 NOTE — Assessment & Plan Note (Signed)
 BP well controlled today with no symptoms such as headache, dizziness or lightheadedness.  Continue current treatment with no changes.  Follow up on BP at next appointment.

## 2024-07-12 NOTE — Assessment & Plan Note (Signed)
 Starting weight of 311 on makeshift dose of qsymia; current weight of 291.  She needs a refill of phentermine  and topiramate  today.  No side effects of medications mentioned today.  PDMP checked with no concerns noted.  Will continue current doses of medications.

## 2024-08-10 ENCOUNTER — Other Ambulatory Visit (INDEPENDENT_AMBULATORY_CARE_PROVIDER_SITE_OTHER): Payer: Self-pay | Admitting: Family Medicine

## 2024-08-10 DIAGNOSIS — E7849 Other hyperlipidemia: Secondary | ICD-10-CM

## 2024-08-11 ENCOUNTER — Other Ambulatory Visit (INDEPENDENT_AMBULATORY_CARE_PROVIDER_SITE_OTHER): Payer: Self-pay | Admitting: Family Medicine

## 2024-08-11 DIAGNOSIS — F5089 Other specified eating disorder: Secondary | ICD-10-CM

## 2024-08-22 ENCOUNTER — Ambulatory Visit (INDEPENDENT_AMBULATORY_CARE_PROVIDER_SITE_OTHER): Admitting: Family Medicine

## 2024-09-11 ENCOUNTER — Encounter (INDEPENDENT_AMBULATORY_CARE_PROVIDER_SITE_OTHER): Payer: Self-pay | Admitting: Family Medicine

## 2024-09-11 ENCOUNTER — Ambulatory Visit (INDEPENDENT_AMBULATORY_CARE_PROVIDER_SITE_OTHER): Payer: Self-pay | Admitting: Family Medicine

## 2024-09-11 VITALS — BP 142/76 | HR 71 | Temp 97.9°F | Ht 67.0 in | Wt 293.0 lb

## 2024-09-11 DIAGNOSIS — Z6841 Body Mass Index (BMI) 40.0 and over, adult: Secondary | ICD-10-CM

## 2024-09-11 DIAGNOSIS — E7849 Other hyperlipidemia: Secondary | ICD-10-CM

## 2024-09-11 DIAGNOSIS — F5089 Other specified eating disorder: Secondary | ICD-10-CM | POA: Diagnosis not present

## 2024-09-11 DIAGNOSIS — E669 Obesity, unspecified: Secondary | ICD-10-CM | POA: Diagnosis not present

## 2024-09-11 DIAGNOSIS — I10 Essential (primary) hypertension: Secondary | ICD-10-CM | POA: Diagnosis not present

## 2024-09-11 MED ORDER — TOPIRAMATE 100 MG PO TABS: 100.0000 mg | ORAL_TABLET | Freq: Every day | ORAL | 0 refills | Status: DC

## 2024-09-11 MED ORDER — PHENTERMINE HCL 15 MG PO CAPS
15.0000 mg | ORAL_CAPSULE | Freq: Every day | ORAL | 0 refills | Status: DC
Start: 2024-09-11 — End: 2024-10-01

## 2024-09-11 MED ORDER — AMLODIPINE BESYLATE 5 MG PO TABS: 5.0000 mg | ORAL_TABLET | Freq: Every day | ORAL | 1 refills | Status: AC

## 2024-09-11 MED ORDER — ATORVASTATIN CALCIUM 10 MG PO TABS: 10.0000 mg | ORAL_TABLET | Freq: Every day | ORAL | 0 refills | Status: DC

## 2024-09-11 NOTE — Progress Notes (Signed)
 SUBJECTIVE:  Chief Complaint: Obesity  Interim History: Patient here for follow up since early September.  Her husband was sick at the time of her last appointment and she had to cancel her October appointment.  Since early September she has been eating breakfast- two eggs, bread, turkey sausage patties and apple bacon, milk and orange juice occasionally.  She is drinking water for lunch and occasionally having cereal or organic bananas, blueberries or strawberries.  She is awaiting protein cereal delivery.  She is getting tired of eating a turkey sandwich for lunch.  She stopped working out for a bit because her knees were hurting.  Not sure what her plans are for Thanksgiving yet.    Seynabou is here to discuss her progress with her obesity treatment plan. She is on the keeping a food journal and adhering to recommended goals of 1800 calories and 150 grams of protein and states she is following her eating plan approximately 50 % of the time. She states she is walking.   OBJECTIVE: Visit Diagnoses: Problem List Items Addressed This Visit       Cardiovascular and Mediastinum   Primary hypertension   Relevant Medications   amLODipine  (NORVASC ) 5 MG tablet   atorvastatin  (LIPITOR) 10 MG tablet     Other   Other hyperlipidemia   Relevant Medications   amLODipine  (NORVASC ) 5 MG tablet   atorvastatin  (LIPITOR) 10 MG tablet   Disorder of eating   Relevant Medications   phentermine  15 MG capsule   topiramate  (TOPAMAX ) 100 MG tablet    Vitals Temp: 97.9 F (36.6 C) BP: (!) 142/76 Pulse Rate: 71 SpO2: 96 %   Anthropometric Measurements Height: 5' 7 (1.702 m) Weight: 293 lb (132.9 kg) BMI (Calculated): 45.88 Weight at Last Visit: 291 lb Weight Lost Since Last Visit: 0 Weight Gained Since Last Visit: 2 Starting Weight: 308 lb Total Weight Loss (lbs): 15 lb (6.804 kg)   Body Composition  Body Fat %: 50.4 % Fat Mass (lbs): 148 lbs Muscle Mass (lbs): 138.4 lbs Total Body  Water (lbs): 105.4 lbs Visceral Fat Rating : 17   Other Clinical Data Today's Visit #: 27 Starting Date: 11/30/22 Comments: 1800/150     ASSESSMENT AND PLAN: Assessment & Plan Other disorder of eating Initial weight of 311 and patient down to 293.  She is doing well on combination of phentermine  and topiramate . PDMP checked with no concerns.  Continue current doses and refills sent to pharmacy. Primary hypertension Blood pressure slightly elevated today.  No recent changes in medication and no chest pain, chest pressure, or headache.  Will continue current medication at same dosage and follow-up at next appointment.  If blood pressure remains elevated may need to make changes in medication dosage at that time. Other hyperlipidemia Elevated cholesterol previously.  Patient is working on limiting saturated fats to less than 20% total intake.  Will repeat labs in 2-3 months.  Refill atorvastatin  at current dose. Obesity with starting BMI of 49.2  BMI 45.0-49.9, adult (HCC)    Diet: Amariz is currently in the action stage of change. As such, her goal is to continue with weight loss efforts and has agreed to keeping a food journal and adhering to recommended goals of 1800 calories and 125 or more grams of protein daily.   Exercise:  For substantial health benefits, adults should do at least 150 minutes (2 hours and 30 minutes) a week of moderate-intensity, or 75 minutes (1 hour and 15 minutes) a week  of vigorous-intensity aerobic physical activity, or an equivalent combination of moderate- and vigorous-intensity aerobic activity. Aerobic activity should be performed in episodes of at least 10 minutes, and preferably, it should be spread throughout the week.  Behavior Modification:  We discussed the following Behavioral Modification Strategies today: increasing lean protein intake, decreasing simple carbohydrates, increasing vegetables, meal planning and cooking strategies, and planning for  success.   Return in about 4 weeks (around 10/09/2024).   She was informed of the importance of frequent follow up visits to maximize her success with intensive lifestyle modifications for her multiple health conditions.  Attestation Statements:   Reviewed by clinician on day of visit: allergies, medications, problem list, medical history, surgical history, family history, social history, and previous encounter notes.  Adelita Cho, MD

## 2024-09-18 NOTE — Assessment & Plan Note (Signed)
 Initial weight of 311 and patient down to 293.  She is doing well on combination of phentermine  and topiramate . PDMP checked with no concerns.  Continue current doses and refills sent to pharmacy.

## 2024-09-18 NOTE — Assessment & Plan Note (Signed)
 Elevated cholesterol previously.  Patient is working on limiting saturated fats to less than 20% total intake.  Will repeat labs in 2-3 months.  Refill atorvastatin  at current dose.

## 2024-09-18 NOTE — Assessment & Plan Note (Signed)
 Blood pressure slightly elevated today.  No recent changes in medication and no chest pain, chest pressure, or headache.  Will continue current medication at same dosage and follow-up at next appointment.  If blood pressure remains elevated may need to make changes in medication dosage at that time.

## 2024-09-20 ENCOUNTER — Other Ambulatory Visit (HOSPITAL_BASED_OUTPATIENT_CLINIC_OR_DEPARTMENT_OTHER): Payer: Self-pay | Admitting: Family Medicine

## 2024-09-20 DIAGNOSIS — I10 Essential (primary) hypertension: Secondary | ICD-10-CM

## 2024-10-01 ENCOUNTER — Ambulatory Visit (INDEPENDENT_AMBULATORY_CARE_PROVIDER_SITE_OTHER): Admitting: Family Medicine

## 2024-10-01 VITALS — BP 136/84 | HR 72 | Temp 97.7°F | Ht 67.0 in | Wt 288.0 lb

## 2024-10-01 DIAGNOSIS — Z6841 Body Mass Index (BMI) 40.0 and over, adult: Secondary | ICD-10-CM

## 2024-10-01 DIAGNOSIS — F5089 Other specified eating disorder: Secondary | ICD-10-CM | POA: Diagnosis not present

## 2024-10-01 DIAGNOSIS — R7303 Prediabetes: Secondary | ICD-10-CM | POA: Diagnosis not present

## 2024-10-01 DIAGNOSIS — E669 Obesity, unspecified: Secondary | ICD-10-CM | POA: Diagnosis not present

## 2024-10-01 MED ORDER — PHENTERMINE HCL 15 MG PO CAPS: 15.0000 mg | ORAL_CAPSULE | Freq: Every day | ORAL | 0 refills | Status: DC

## 2024-10-01 MED ORDER — TOPIRAMATE 100 MG PO TABS: 100.0000 mg | ORAL_TABLET | Freq: Every day | ORAL | 0 refills | Status: AC

## 2024-10-01 MED ORDER — PHENTERMINE HCL 15 MG PO CAPS: 15.0000 mg | ORAL_CAPSULE | ORAL | 0 refills | Status: DC

## 2024-10-01 NOTE — Progress Notes (Signed)
 SUBJECTIVE:  Chief Complaint: Obesity  Interim History: Patient here for follow up.  She is feeling she is doing well with her food log.  She finds that if she thinks less about her food intake and hitting her goals she can't do it.  She is cooking for Thanksgiving and her kids are coming to her house.  She isn't sure what her plans are for December.  She mentions for December she and her husband are driving to her son and daughter in laws.   Meredith Wright is here to discuss her progress with her obesity treatment plan. She is on the keeping a food journal and adhering to recommended goals of 1800 calories and 125 grams of protein and states she is following her eating plan approximately 75 % of the time. She states she is exercising 20 minutes 5 times per week.   OBJECTIVE: Visit Diagnoses: Problem List Items Addressed This Visit       Other   BMI 45.0-49.9, adult (HCC)   Relevant Medications   phentermine  15 MG capsule   phentermine  15 MG capsule   Prediabetes - Primary   Obesity with starting BMI of 49.2   Relevant Medications   phentermine  15 MG capsule   phentermine  15 MG capsule   Disorder of eating   Relevant Medications   topiramate  (TOPAMAX ) 100 MG tablet   phentermine  15 MG capsule    Vitals Temp: 97.7 F (36.5 C) BP: 136/84 Pulse Rate: 72 SpO2: 98 %   Anthropometric Measurements Height: 5' 7 (1.702 m) Weight: 288 lb (130.6 kg) BMI (Calculated): 45.1 Weight at Last Visit: 293 lb Weight Lost Since Last Visit: 5 Weight Gained Since Last Visit: 0 Starting Weight: 308 lb Total Weight Loss (lbs): 20 lb (9.072 kg)   Body Composition  Body Fat %: 52.2 % Fat Mass (lbs): 150.6 lbs Muscle Mass (lbs): 131 lbs Total Body Water (lbs): 100.8 lbs Visceral Fat Rating : 17   Other Clinical Data Today's Visit #: 28 Starting Date: 11/30/22 Comments: 1800/150     ASSESSMENT AND PLAN: Assessment & Plan Prediabetes Last A1c 5.9 in January of this year.  She has  been working on simple carbohydrate limitation and focusing on protein intake.  Other disorder of eating Initial weight of 311 and patient down to 288.  She is doing well on combination of phentermine  and topiramate . PDMP checked with no concerns.  Continue current doses and refills sent to pharmacy.  Patient has achieved 5% loss Obesity with starting BMI of 49.2  BMI 45.0-49.9, adult (HCC)    Diet: Meredith Wright is currently in the action stage of change. As such, her goal is to continue with weight loss efforts and has agreed to keeping a food journal and adhering to recommended goals of 1800 calories and 125 or more grams protein daily   Exercise:  For substantial health benefits, adults should do at least 150 minutes (2 hours and 30 minutes) a week of moderate-intensity, or 75 minutes (1 hour and 15 minutes) a week of vigorous-intensity aerobic physical activity, or an equivalent combination of moderate- and vigorous-intensity aerobic activity. Aerobic activity should be performed in episodes of at least 10 minutes, and preferably, it should be spread throughout the week.  Plans to stay consistent   Behavior Modification:  We discussed the following Behavioral Modification Strategies today: increasing lean protein intake, decreasing simple carbohydrates, increasing vegetables, meal planning and cooking strategies, planning for success, and keep a strict food journal. We discussed various medication  options to help Meredith Wright with her weight loss efforts and we both agreed to continue phentermine  and topiramate  at current doses for the next 2 months.  Return in about 6 weeks (around 11/12/2024).   She was informed of the importance of frequent follow up visits to maximize her success with intensive lifestyle modifications for her multiple health conditions.  Attestation Statements:   Reviewed by clinician on day of visit: allergies, medications, problem list, medical history, surgical history, family  history, social history, and previous encounter notes.    Meredith Cho, MD

## 2024-10-01 NOTE — Assessment & Plan Note (Addendum)
 Initial weight of 311 and patient down to 288.  She is doing well on combination of phentermine  and topiramate . PDMP checked with no concerns.  Continue current doses and refills sent to pharmacy.  Patient has achieved 5% loss

## 2024-10-01 NOTE — Assessment & Plan Note (Signed)
 Last A1c 5.9 in January of this year.  She has been working on simple carbohydrate limitation and focusing on protein intake.

## 2024-10-09 ENCOUNTER — Ambulatory Visit (INDEPENDENT_AMBULATORY_CARE_PROVIDER_SITE_OTHER): Admitting: Family Medicine

## 2024-11-10 ENCOUNTER — Other Ambulatory Visit (INDEPENDENT_AMBULATORY_CARE_PROVIDER_SITE_OTHER): Payer: Self-pay | Admitting: Family Medicine

## 2024-11-10 DIAGNOSIS — F5089 Other specified eating disorder: Secondary | ICD-10-CM

## 2024-11-15 ENCOUNTER — Encounter (HOSPITAL_BASED_OUTPATIENT_CLINIC_OR_DEPARTMENT_OTHER): Payer: Self-pay | Admitting: Family Medicine

## 2024-11-15 ENCOUNTER — Ambulatory Visit (HOSPITAL_BASED_OUTPATIENT_CLINIC_OR_DEPARTMENT_OTHER): Admitting: Family Medicine

## 2024-11-15 VITALS — BP 132/73 | HR 78 | Temp 98.5°F | Resp 18 | Ht 67.0 in | Wt 291.0 lb

## 2024-11-15 DIAGNOSIS — E559 Vitamin D deficiency, unspecified: Secondary | ICD-10-CM

## 2024-11-15 DIAGNOSIS — I10 Essential (primary) hypertension: Secondary | ICD-10-CM

## 2024-11-15 DIAGNOSIS — E538 Deficiency of other specified B group vitamins: Secondary | ICD-10-CM | POA: Insufficient documentation

## 2024-11-15 DIAGNOSIS — R7303 Prediabetes: Secondary | ICD-10-CM

## 2024-11-15 DIAGNOSIS — G35D Multiple sclerosis, unspecified: Secondary | ICD-10-CM

## 2024-11-15 DIAGNOSIS — E785 Hyperlipidemia, unspecified: Secondary | ICD-10-CM

## 2024-11-15 DIAGNOSIS — Z6841 Body Mass Index (BMI) 40.0 and over, adult: Secondary | ICD-10-CM

## 2024-11-15 NOTE — Progress Notes (Unsigned)
" ° ° °  Procedures performed today:    None.  Independent interpretation of notes and tests performed by another provider:   None.  Brief History, Exam, Impression, and Recommendations:    BP 132/73 (BP Location: Left Arm, Patient Position: Sitting, Cuff Size: Large)   Pulse 78   Temp 98.5 F (36.9 C) (Oral)   Resp 18   Ht 5' 7 (1.702 m)   Wt 291 lb (132 kg)   SpO2 98%   BMI 45.58 kg/m   Primary hypertension -     CBC with Differential/Platelet -     Comprehensive metabolic panel with GFR -     Hemoglobin A1c -     Lipid panel -     TSH Rfx on Abnormal to Free T4  Prediabetes -     Hemoglobin A1c  BMI 45.0-49.9, adult (HCC) -     Lipid panel  Vitamin D  deficiency -     VITAMIN D  25 Hydroxy (Vit-D Deficiency, Fractures)  B12 deficiency -     Vitamin B12  Hyperlipidemia, unspecified hyperlipidemia type -     Lipid panel  Multiple sclerosis  Return in about 6 months (around 05/15/2025) for hypertension, prediabetes.   ___________________________________________ Davidson Palmieri de [place], MD, ABFM, CAQSM Primary Care and Sports Medicine [facility] "

## 2024-11-16 LAB — CBC WITH DIFFERENTIAL/PLATELET
Basophils Absolute: 0 x10E3/uL (ref 0.0–0.2)
Basos: 1 %
EOS (ABSOLUTE): 0.2 x10E3/uL (ref 0.0–0.4)
Eos: 3 %
Hematocrit: 43.4 % (ref 34.0–46.6)
Hemoglobin: 14 g/dL (ref 11.1–15.9)
Immature Grans (Abs): 0 x10E3/uL (ref 0.0–0.1)
Immature Granulocytes: 0 %
Lymphocytes Absolute: 1.6 x10E3/uL (ref 0.7–3.1)
Lymphs: 26 %
MCH: 28.7 pg (ref 26.6–33.0)
MCHC: 32.3 g/dL (ref 31.5–35.7)
MCV: 89 fL (ref 79–97)
Monocytes Absolute: 0.5 x10E3/uL (ref 0.1–0.9)
Monocytes: 8 %
Neutrophils Absolute: 3.9 x10E3/uL (ref 1.4–7.0)
Neutrophils: 62 %
Platelets: 278 x10E3/uL (ref 150–450)
RBC: 4.88 x10E6/uL (ref 3.77–5.28)
RDW: 14.2 % (ref 11.7–15.4)
WBC: 6.3 x10E3/uL (ref 3.4–10.8)

## 2024-11-16 LAB — COMPREHENSIVE METABOLIC PANEL WITH GFR
ALT: 25 IU/L (ref 0–32)
AST: 23 IU/L (ref 0–40)
Albumin: 4.5 g/dL (ref 3.8–4.9)
Alkaline Phosphatase: 109 IU/L (ref 49–135)
BUN/Creatinine Ratio: 23 (ref 9–23)
BUN: 18 mg/dL (ref 6–24)
Bilirubin Total: 0.4 mg/dL (ref 0.0–1.2)
CO2: 27 mmol/L (ref 20–29)
Calcium: 9.3 mg/dL (ref 8.7–10.2)
Chloride: 104 mmol/L (ref 96–106)
Creatinine, Ser: 0.79 mg/dL (ref 0.57–1.00)
Globulin, Total: 2.9 g/dL (ref 1.5–4.5)
Glucose: 85 mg/dL (ref 70–99)
Potassium: 4 mmol/L (ref 3.5–5.2)
Sodium: 143 mmol/L (ref 134–144)
Total Protein: 7.4 g/dL (ref 6.0–8.5)
eGFR: 90 mL/min/1.73

## 2024-11-16 LAB — LIPID PANEL
Chol/HDL Ratio: 3.7 ratio (ref 0.0–4.4)
Cholesterol, Total: 159 mg/dL (ref 100–199)
HDL: 43 mg/dL
LDL Chol Calc (NIH): 92 mg/dL (ref 0–99)
Triglycerides: 136 mg/dL (ref 0–149)
VLDL Cholesterol Cal: 24 mg/dL (ref 5–40)

## 2024-11-16 LAB — HEMOGLOBIN A1C
Est. average glucose Bld gHb Est-mCnc: 111 mg/dL
Hgb A1c MFr Bld: 5.5 % (ref 4.8–5.6)

## 2024-11-16 LAB — VITAMIN B12: Vitamin B-12: 1054 pg/mL (ref 232–1245)

## 2024-11-16 LAB — TSH RFX ON ABNORMAL TO FREE T4: TSH: 2.39 u[IU]/mL (ref 0.450–4.500)

## 2024-11-16 LAB — VITAMIN D 25 HYDROXY (VIT D DEFICIENCY, FRACTURES): Vit D, 25-Hydroxy: 57.4 ng/mL (ref 30.0–100.0)

## 2024-11-19 ENCOUNTER — Encounter (INDEPENDENT_AMBULATORY_CARE_PROVIDER_SITE_OTHER): Payer: Self-pay | Admitting: Family Medicine

## 2024-11-19 ENCOUNTER — Ambulatory Visit (INDEPENDENT_AMBULATORY_CARE_PROVIDER_SITE_OTHER): Admitting: Family Medicine

## 2024-11-19 DIAGNOSIS — I10 Essential (primary) hypertension: Secondary | ICD-10-CM

## 2024-11-19 DIAGNOSIS — E669 Obesity, unspecified: Secondary | ICD-10-CM

## 2024-11-19 DIAGNOSIS — Z6841 Body Mass Index (BMI) 40.0 and over, adult: Secondary | ICD-10-CM | POA: Diagnosis not present

## 2024-11-19 DIAGNOSIS — E785 Hyperlipidemia, unspecified: Secondary | ICD-10-CM | POA: Diagnosis not present

## 2024-11-19 DIAGNOSIS — E7849 Other hyperlipidemia: Secondary | ICD-10-CM

## 2024-11-19 DIAGNOSIS — E559 Vitamin D deficiency, unspecified: Secondary | ICD-10-CM

## 2024-11-19 DIAGNOSIS — R7303 Prediabetes: Secondary | ICD-10-CM | POA: Diagnosis not present

## 2024-11-19 DIAGNOSIS — E66813 Obesity, class 3: Secondary | ICD-10-CM

## 2024-11-19 MED ORDER — PHENTERMINE HCL 15 MG PO CAPS: 15.0000 mg | ORAL_CAPSULE | ORAL | 0 refills | Status: DC

## 2024-11-19 MED ORDER — ATORVASTATIN CALCIUM 10 MG PO TABS: 10.0000 mg | ORAL_TABLET | Freq: Every day | ORAL | 0 refills | Status: AC

## 2024-11-19 NOTE — Progress Notes (Signed)
 "  Meredith Wright, D.O.  ABFM, ABOM Specializing in Clinical Bariatric Medicine  Office located at: [address] [place], [place]  [address]    Medications Discontinued During This Encounter  Medication Reason   atorvastatin  (LIPITOR) 10 MG tablet Reorder   phentermine  15 MG capsule Reorder     Meds ordered this encounter  Medications   phentermine  15 MG capsule    Sig: Take 1 capsule (15 mg total) by mouth every morning.    Dispense:  30 capsule    Refill:  0   atorvastatin  (LIPITOR) 10 MG tablet    Sig: Take 1 tablet (10 mg total) by mouth daily.    Dispense:  90 tablet    Refill:  0      A) FOR THE CHRONIC DISEASE OF OBESITY:  Chief complaint: Obesity Meredith Wright is here to discuss her progress with her obesity treatment plan.   History of present illness / Interval history:  Meredith Wright is here today for her follow-up office visit.  Since last OV on 10/01/24, pt is up 2 lbs. Patient states that she helped her daughter move out of the state.    10/01/24 15:00 11/19/24 15:00   Body Fat % 52.2 % 53.7 %  Muscle Mass (lbs) 131 lbs 127.8 lbs  Fat Mass (lbs) 150.6 lbs 156 lbs  Total Body Water (lbs) 100.8 lbs   Visceral Fat Rating  17 18    Counseling done on how various foods will affect these numbers and how to maximize success.  Total lbs lost to date: - 18 lbs Total Fat Mass in lbs lost to date: - 13 lbs Total weight loss percentage to date: - 5.84 %    Obesity with starting BMI of 49.2 Obesity (BMI) of 45.0 to 49.9 in adult, current BMI 45.41  Nutrition Therapy She is keeping a food journal and adhering to recommended goals of 1800 calories and 125 grams of protein and states she is following her eating plan approximately 100 % of the time.   - Tracking Calories/Macros: yes  - Eating More Whole Foods: no  - Adequate Protein Intake: yes  - Adequate Water Intake: yes  - Skipping Meals: yes  - Sleeping 7-9 Hours/ Night: yes   [name] is currently in  the action stage of change. As such, her goal is to continue weight management plan.  She has agreed to: Journal 1600-1700 calories with 110+ g of protein.    Physical Activity [name] is walking 30 to 40  minutes 5 days per week   [name] has been advised to work up to 300-450 minutes of moderate intensity aerobic activity a week and strengthening exercises 2-3 times per week for cardiovascular health, weight loss maintenance and preservation of muscle mass.  She has agreed to : Think about enjoyable ways to increase daily physical activity and overcoming barriers to exercise and Increase physical activity in their day and reduce sedentary time (increase NEAT).   Behavioral Modifications Evidence-based interventions for health behavior change were utilized today including the discussion of   1) SMART goals for next OV:    - Increase lean protein   Regarding patient's less desirable eating habits and patterns, we employed the technique of small changes.   We discussed the following today: increasing lean protein intake to established goals, decreasing simple carbohydrates , keeping healthy foods at home, planning for success, and better snacking choices Additional resources provided today: Provided patient with personalized instruction given on  how to measure portions and weigh foods for accurate calculation of calories   Medical Interventions/ Pharmacotherapy Previous Bariatric surgery: n/a Pharmacotherapy for weight loss: She is currently taking Phentermine  15 mg daily and Topamax  100 mg daily for medical weight loss.    We discussed various medication options to help Navina with her weight loss efforts and we both agreed to : Adequate clinical response to anti-obesity medication, continue current anti-obesity regimen   B) OBESITY RELATED CONDITIONS ADDRESSED TODAY:  Reviewed patients labs from PCP today per patients request  Prediabetes Assessment & Plan Lab Results  Component Value  Date   HGBA1C 5.5 11/15/2024   HGBA1C 5.9 (H) 11/09/2023   HGBA1C 6.1 (H) 11/30/2022   INSULIN  16.6 11/09/2023   INSULIN  13.7 11/30/2022      Component Value Date/Time   NA 143 11/15/2024 1030   K 4.0 11/15/2024 1030   CL 104 11/15/2024 1030   CO2 27 11/15/2024 1030   GLUCOSE 85 11/15/2024 1030   GLUCOSE 92 01/15/2021 1200   BUN 18 11/15/2024 1030   CREATININE 0.79 11/15/2024 1030   CREATININE 0.60 06/06/2013 0942   CALCIUM  9.3 11/15/2024 1030   PROT 7.4 11/15/2024 1030   ALBUMIN 4.5 11/15/2024 1030   AST 23 11/15/2024 1030   ALT 25 11/15/2024 1030   ALKPHOS 109 11/15/2024 1030   BILITOT 0.4 11/15/2024 1030   EGFR 90 11/15/2024 1030   GFRNONAA >60 01/15/2021 1200   GFRNONAA >89 06/06/2013 0942      Component Value Date/Time   WBC 6.3 11/15/2024 1030   WBC 9.0 01/15/2021 1200   RBC 4.88 11/15/2024 1030   RBC 4.76 01/15/2021 1200   HGB 14.0 11/15/2024 1030   HCT 43.4 11/15/2024 1030   PLT 278 11/15/2024 1030   MCV 89 11/15/2024 1030   MCH 28.7 11/15/2024 1030   MCH 27.5 01/15/2021 1200   MCHC 32.3 11/15/2024 1030   MCHC 34.3 01/15/2021 1200   RDW 14.2 11/15/2024 1030   LYMPHSABS 1.6 11/15/2024 1030   MONOABS 0.8 01/15/2021 1200   EOSABS 0.2 11/15/2024 1030   BASOSABS 0.0 11/15/2024 1030   Lab Results  Component Value Date   TSH 2.390 11/15/2024   T3TOTAL 122 11/30/2022   FREET4 1.18 11/30/2022   On Phentermine  15 mg daily and Topamax  100 mg daily. Patient states that she has good control of hunger and cravings. Patient reports journaling every day but does not understand it. Explained to patient how to journal and how to use the app. Patient states that she has not been able to refill Phentermine  since 10/31/24 since she has not been in the office to get a refill. Checked PDMP and was correct. Patients A1C has decreased from 5.9 to 5.5. Kidney, CBC and liver enzymes are WNL.   Since patient has increased fat mass and decreased muscle mass if she does not  decrease fast mass in future at next OV. Recommend that patient discontinue the use of Phentermine  due to poor compliance with diet and lack of weight loss. Patient desires to increase dose in Phentermine  but denied today and recommend that she talk to Dr.U since she is skipping meals.   Will refill at current dose. Continue with following prudent meal plan and focusing on weighing and measuring foods. Accurately inputting and journaling meals.     Vitamin D  deficiency Assessment & Plan Lab Results  Component Value Date   VD25OH 57.4 11/15/2024   VD25OH 43.7 11/09/2023   VD25OH 11.7 (L) 11/30/2022  Lab Results  Component Value Date   VITAMINB12 1,054 11/15/2024   FOLATE 13.0 11/09/2023    Taking ERGO 50K units once weekly. With reported good compliance and tolerance. Patients Vit D levels are at goal. Goal levels reviewed with patient. Continue with supplementation. No acute concerns today. B12 levels are at goal. Continue with current supplementation if patient is taking any.    Primary hypertension Assessment & Plan BP Readings from Last 3 Encounters:  11/19/24 124/71  11/15/24 132/73  10/01/24 136/84   The 10-year ASCVD risk score (Arnett DK, et al., 2019) is: 3.9%  Lab Results  Component Value Date   CREATININE 0.79 11/15/2024   On Norvasc  5 mg daily and Diovan  160 mg daily. BP today is at goal- well controlled. Continue with medications and following low sodium diet. Will follow along side specialist/pcp.     Hyperlipidemia, unspecified hyperlipidemia type Assessment & Plan Lab Results  Component Value Date   CHOL 159 11/15/2024   HDL 43 11/15/2024   LDLCALC 92 11/15/2024   TRIG 136 11/15/2024   CHOLHDL 3.7 11/15/2024   On Lipitor 10 mg daily with reported good compliance and tolerance. Patients LDL levels have decreased from 111 to 92 which is WNL. Trig levels have also decreased from 180 to 136. Stressed to patient the importance of decreasing fatty foods and  saturated and trans fats. Continue medications.     Medications Discontinued During This Encounter  Medication Reason   atorvastatin  (LIPITOR) 10 MG tablet Reorder   phentermine  15 MG capsule Reorder     Meds ordered this encounter  Medications   phentermine  15 MG capsule    Sig: Take 1 capsule (15 mg total) by mouth every morning.    Dispense:  30 capsule    Refill:  0   atorvastatin  (LIPITOR) 10 MG tablet    Sig: Take 1 tablet (10 mg total) by mouth daily.    Dispense:  90 tablet    Refill:  0      Follow up:   Return 12/12/2024 at 3:20 PM  She was informed of the importance of frequent follow up visits to maximize her success with intensive lifestyle modifications for her multiple health conditions.   Weight Summary and Biometrics   Weight Lost Since Last Visit: 0lb  Weight Gained Since Last Visit: 2lb   Vitals Temp: 98 F (36.7 C) BP: 124/71 Pulse Rate: 69 SpO2: 96 %   Anthropometric Measurements Height: 5' 7 (1.702 m) Weight: 290 lb (131.5 kg) BMI (Calculated): 45.41 Weight at Last Visit: 288lb Weight Lost Since Last Visit: 0lb Weight Gained Since Last Visit: 2lb Starting Weight: 308lb Total Weight Loss (lbs): 18 lb (8.165 kg)   Body Composition  Body Fat %: 53.7 % Fat Mass (lbs): 156 lbs Muscle Mass (lbs): 127.8 lbs Visceral Fat Rating : 18   Other Clinical Data Fasting: no Labs: no Today's Visit #: 29 Starting Date: 11/30/22    Objective:   PHYSICAL EXAM: Blood pressure 124/71, pulse 69, temperature 98 F (36.7 C), height 5' 7 (1.702 m), weight 290 lb (131.5 kg), SpO2 96%. Body mass index is 45.42 kg/m.  General: she is overweight, cooperative and in no acute distress. PSYCH: Has normal mood, affect and thought process.   HEENT: EOMI, sclerae are anicteric. Lungs: Normal breathing effort, no conversational dyspnea. Extremities: Moves * 4 Neurologic: A and O * 3, good insight  DIAGNOSTIC DATA REVIEWED: BMET    Component Value  Date/Time   NA 143  11/15/2024 1030   K 4.0 11/15/2024 1030   CL 104 11/15/2024 1030   CO2 27 11/15/2024 1030   GLUCOSE 85 11/15/2024 1030   GLUCOSE 92 01/15/2021 1200   BUN 18 11/15/2024 1030   CREATININE 0.79 11/15/2024 1030   CREATININE 0.60 06/06/2013 0942   CALCIUM  9.3 11/15/2024 1030   GFRNONAA >60 01/15/2021 1200   GFRNONAA >89 06/06/2013 0942   GFRAA >60 12/20/2015 1814   GFRAA >89 06/06/2013 0942   Lab Results  Component Value Date   HGBA1C 5.5 11/15/2024   HGBA1C 6.1 (H) 11/30/2022   Lab Results  Component Value Date   INSULIN  16.6 11/09/2023   INSULIN  13.7 11/30/2022   Lab Results  Component Value Date   TSH 2.390 11/15/2024   CBC    Component Value Date/Time   WBC 6.3 11/15/2024 1030   WBC 9.0 01/15/2021 1200   RBC 4.88 11/15/2024 1030   RBC 4.76 01/15/2021 1200   HGB 14.0 11/15/2024 1030   HCT 43.4 11/15/2024 1030   PLT 278 11/15/2024 1030   MCV 89 11/15/2024 1030   MCH 28.7 11/15/2024 1030   MCH 27.5 01/15/2021 1200   MCHC 32.3 11/15/2024 1030   MCHC 34.3 01/15/2021 1200   RDW 14.2 11/15/2024 1030   Iron  Studies    Component Value Date/Time   IRON  43 06/06/2013 0942   TIBC 328 06/06/2013 0942   IRONPCTSAT 13 (L) 06/06/2013 0942   Lipid Panel     Component Value Date/Time   CHOL 159 11/15/2024 1030   TRIG 136 11/15/2024 1030   HDL 43 11/15/2024 1030   CHOLHDL 3.7 11/15/2024 1030   CHOLHDL 4.2 01/15/2021 1200   VLDL 32 01/15/2021 1200   LDLCALC 92 11/15/2024 1030   Hepatic Function Panel     Component Value Date/Time   PROT 7.4 11/15/2024 1030   ALBUMIN 4.5 11/15/2024 1030   AST 23 11/15/2024 1030   ALT 25 11/15/2024 1030   ALKPHOS 109 11/15/2024 1030   BILITOT 0.4 11/15/2024 1030      Component Value Date/Time   TSH 2.390 11/15/2024 1030   TSH 2.760 11/09/2023 1008   Nutritional Lab Results  Component Value Date   VD25OH 57.4 11/15/2024   VD25OH 43.7 11/09/2023   VD25OH 11.7 (L) 11/30/2022    Attestations:   LILLETTE Sonny Laroche, acting as a stage manager for Meredith Jenkins, DO., have compiled all relevant documentation for today's office visit on behalf of Meredith Jenkins, DO, while in the presence of [employer], DO.  I have spent 60 minutes in the care of the patient today including 50 minutes face-to-face assessing and reviewing listed medical problems above as outlined in office visit note and providing nutritional and behavioral counseling as outlined in obesity care plan.   I have reviewed the above documentation for accuracy and completeness, and I agree with the above. Meredith JINNY Wright, D.O.  The 21st Century Cures Act was signed into law in 2016 which includes the topic of electronic health records.  This provides immediate access to information in MyChart.  This includes consultation notes, operative notes, office notes, lab results and pathology reports.  If you have any questions about what you read please let us  know at your next visit so we can discuss your concerns and take corrective action if need be.  We are right here with you.  "

## 2024-11-27 ENCOUNTER — Telehealth: Payer: Self-pay | Admitting: Family Medicine

## 2024-11-27 NOTE — Telephone Encounter (Signed)
 Copied from CRM #[id number]. Topic: Appointments - Scheduling Inquiry for Clinic >> Nov 27, 2024  3:54 PM Donna BRAVO wrote: Reason for CRM: Patient sister Linda Livings DOB 12/14 [birth year] 10/23/77  Courtny would like to establish care with Dr Clotilda Single MD.    Please call patient regarding this information  Patient phone number [phone]

## 2024-11-27 NOTE — Telephone Encounter (Signed)
 That's fine

## 2024-12-02 ENCOUNTER — Ambulatory Visit (HOSPITAL_BASED_OUTPATIENT_CLINIC_OR_DEPARTMENT_OTHER): Payer: Self-pay | Admitting: Family Medicine

## 2024-12-12 ENCOUNTER — Encounter (INDEPENDENT_AMBULATORY_CARE_PROVIDER_SITE_OTHER): Payer: Self-pay | Admitting: Family Medicine

## 2024-12-12 ENCOUNTER — Ambulatory Visit (INDEPENDENT_AMBULATORY_CARE_PROVIDER_SITE_OTHER): Admitting: Family Medicine

## 2024-12-12 VITALS — BP 138/74 | HR 63 | Temp 98.0°F | Ht 67.0 in | Wt 290.0 lb

## 2024-12-12 DIAGNOSIS — I10 Essential (primary) hypertension: Secondary | ICD-10-CM

## 2024-12-12 DIAGNOSIS — Z6841 Body Mass Index (BMI) 40.0 and over, adult: Secondary | ICD-10-CM

## 2024-12-12 DIAGNOSIS — F5089 Other specified eating disorder: Secondary | ICD-10-CM

## 2024-12-12 NOTE — Assessment & Plan Note (Signed)
 Initial weight of 311 and patient down to 290.  She is doing well on combination of phentermine  and topiramate . PDMP checked with no concerns.  Continue current doses and refills sent to pharmacy.  Patient has achieved 5% loss

## 2024-12-12 NOTE — Assessment & Plan Note (Signed)
 Patient blood pressure well controlled today.  No chest pain, chest pressure or headache.  She is tolerating norvasc  and diovan  and denies side effects of either.  Needs refill of amlodipine  today.  Continue current dose.

## 2024-12-12 NOTE — Progress Notes (Unsigned)
 "  SUBJECTIVE:  Chief Complaint: Obesity  Interim History: Patient here for follow up. She had good holidays- her daughter took care of everything and her husband told her daughter everything that was on plan.  She ate quite a bit but didn't experience weight gain or loss. Her daughter was sent to another state for work so now she is back to normal routine.  Foodwise she has been eating what has been available at home because her local grocery stores were bare during the winter weather.  Patient is planning to go to [place] to grocery shop.  Meredith Wright is here to discuss her progress with her obesity treatment plan. She is on the keeping a food journal and adhering to recommended goals of 1600-1700 calories and 110+ grams of protein and states she is following her eating plan approximately 75 % of the time. She states she is exercising 30 minutes 7 times per week.   OBJECTIVE: Visit Diagnoses: Problem List Items Addressed This Visit       Cardiovascular and Mediastinum   Primary hypertension     Other   BMI 45.0-49.9, adult (HCC)   Obesity with starting BMI of 49.2   Disorder of eating - Primary    Vitals Temp: 98 F (36.7 C) BP: 138/74 Pulse Rate: 63 SpO2: 98 %   Anthropometric Measurements Height: 5' 7 (1.702 m) Weight: 290 lb (131.5 kg) BMI (Calculated): 45.41 Weight at Last Visit: 290 lb Weight Lost Since Last Visit: 0 Weight Gained Since Last Visit: 0 Starting Weight: 308 lb Total Weight Loss (lbs): 18 lb (8.165 kg)   Body Composition  Body Fat %: 52.8 % Fat Mass (lbs): 153.2 lbs Muscle Mass (lbs): 130 lbs Total Body Water (lbs): 99.4 lbs Visceral Fat Rating : 18   Other Clinical Data Today's Visit #: 30 Starting Date: 11/30/22 Comments: 1600-1700     ASSESSMENT AND PLAN: Assessment & Plan Other disorder of eating Initial weight of 311 and patient down to 290.  She is doing well on combination of phentermine  and topiramate . PDMP checked with no  concerns.  Continue current doses and refills sent to pharmacy.  Patient has achieved 5% loss Primary hypertension Patient blood pressure well controlled today.  No chest pain, chest pressure or headache.  She is tolerating norvasc  and diovan  and denies side effects of either.  Needs refill of amlodipine  today.  Continue current dose. BMI 45.0-49.9, adult (HCC)  Obesity with starting BMI of 49.2    Diet: Aleiah is currently in the action stage of change. As such, her goal is to continue with weight loss efforts and has agreed to keeping a food journal and adhering to recommended goals of 1600-1700 calories and 110 grams of protein daily.   Exercise:  For substantial health benefits, adults should do at least 150 minutes (2 hours and 30 minutes) a week of moderate-intensity, or 75 minutes (1 hour and 15 minutes) a week of vigorous-intensity aerobic physical activity, or an equivalent combination of moderate- and vigorous-intensity aerobic activity. Aerobic activity should be performed in episodes of at least 10 minutes, and preferably, it should be spread throughout the week.  Behavior Modification:  We discussed the following Behavioral Modification Strategies today: increasing lean protein intake, decreasing simple carbohydrates, increasing vegetables, meal planning and cooking strategies, planning for success, and keep a strict food journal. We discussed various medication options to help Manya with her weight loss efforts and we both agreed to increase phentermine  to 18.75mg  daily and continue topiramate   at 100mg .  Return in about 5 weeks (around 01/16/2025).   She was informed of the importance of frequent follow up visits to maximize her success with intensive lifestyle modifications for her multiple health conditions.  Attestation Statements:   Reviewed by clinician on day of visit: allergies, medications, problem list, medical history, surgical history, family history, social history, and  previous encounter notes.   Adelita Cho, MD "

## 2024-12-23 ENCOUNTER — Ambulatory Visit: Admitting: Family Medicine

## 2025-01-21 ENCOUNTER — Ambulatory Visit (INDEPENDENT_AMBULATORY_CARE_PROVIDER_SITE_OTHER): Admitting: Family Medicine

## 2025-05-16 ENCOUNTER — Ambulatory Visit (HOSPITAL_BASED_OUTPATIENT_CLINIC_OR_DEPARTMENT_OTHER): Admitting: Family Medicine
